# Patient Record
Sex: Female | Born: 1945 | Race: White | Hispanic: No | Marital: Married | Smoking: Never smoker
Health system: Southern US, Community
[De-identification: ages and names within clinical notes are randomized; demographics above are authoritative.]

## PROBLEM LIST (undated history)

## (undated) DIAGNOSIS — E162 Hypoglycemia, unspecified: Secondary | ICD-10-CM

## (undated) DIAGNOSIS — G43909 Migraine, unspecified, not intractable, without status migrainosus: Secondary | ICD-10-CM

## (undated) DIAGNOSIS — M549 Dorsalgia, unspecified: Secondary | ICD-10-CM

## (undated) DIAGNOSIS — E785 Hyperlipidemia, unspecified: Secondary | ICD-10-CM

## (undated) DIAGNOSIS — N3090 Cystitis, unspecified without hematuria: Secondary | ICD-10-CM

## (undated) DIAGNOSIS — M1990 Unspecified osteoarthritis, unspecified site: Secondary | ICD-10-CM

## (undated) DIAGNOSIS — I739 Peripheral vascular disease, unspecified: Secondary | ICD-10-CM

## (undated) DIAGNOSIS — I10 Essential (primary) hypertension: Secondary | ICD-10-CM

## (undated) HISTORY — PX: EYE SURGERY: SHX253

## (undated) HISTORY — PX: TOE SURGERY: SHX1073

## (undated) HISTORY — PX: COLONOSCOPY: SHX174

## (undated) HISTORY — DX: Hyperlipidemia, unspecified: E78.5

## (undated) HISTORY — DX: Essential (primary) hypertension: I10

## (undated) HISTORY — PX: BREAST SURGERY: SHX581

## (undated) HISTORY — DX: Dorsalgia, unspecified: M54.9

## (undated) HISTORY — DX: Migraine, unspecified, not intractable, without status migrainosus: G43.909

## (undated) HISTORY — DX: Unspecified osteoarthritis, unspecified site: M19.90

---

## 2002-08-03 ENCOUNTER — Ambulatory Visit (HOSPITAL_COMMUNITY): Admission: RE | Admit: 2002-08-03 | Discharge: 2002-08-03 | Payer: Self-pay | Admitting: Gastroenterology

## 2002-09-07 ENCOUNTER — Encounter: Payer: Self-pay | Admitting: Emergency Medicine

## 2002-09-07 ENCOUNTER — Emergency Department (HOSPITAL_COMMUNITY): Admission: EM | Admit: 2002-09-07 | Discharge: 2002-09-07 | Payer: Self-pay | Admitting: Emergency Medicine

## 2003-11-02 ENCOUNTER — Ambulatory Visit (HOSPITAL_COMMUNITY): Admission: RE | Admit: 2003-11-02 | Discharge: 2003-11-02 | Payer: Self-pay | Admitting: Gastroenterology

## 2004-08-13 ENCOUNTER — Other Ambulatory Visit: Admission: RE | Admit: 2004-08-13 | Discharge: 2004-08-13 | Payer: Self-pay | Admitting: Internal Medicine

## 2005-08-17 ENCOUNTER — Other Ambulatory Visit: Admission: RE | Admit: 2005-08-17 | Discharge: 2005-08-17 | Payer: Self-pay | Admitting: Internal Medicine

## 2005-09-21 ENCOUNTER — Encounter: Admission: RE | Admit: 2005-09-21 | Discharge: 2005-09-21 | Payer: Self-pay | Admitting: Internal Medicine

## 2006-03-18 ENCOUNTER — Encounter: Admission: RE | Admit: 2006-03-18 | Discharge: 2006-06-16 | Payer: Self-pay | Admitting: Family Medicine

## 2009-11-30 HISTORY — PX: CORNEAL TRANSPLANT: SHX108

## 2009-11-30 HISTORY — PX: CATARACT EXTRACTION: SUR2

## 2011-10-05 ENCOUNTER — Encounter: Payer: Self-pay | Admitting: Family Medicine

## 2011-10-05 ENCOUNTER — Ambulatory Visit (INDEPENDENT_AMBULATORY_CARE_PROVIDER_SITE_OTHER): Payer: BC Managed Care – PPO | Admitting: Family Medicine

## 2011-10-05 VITALS — BP 143/90 | HR 84 | Temp 97.9°F | Ht 68.25 in | Wt 181.2 lb

## 2011-10-05 DIAGNOSIS — N390 Urinary tract infection, site not specified: Secondary | ICD-10-CM

## 2011-10-05 DIAGNOSIS — E785 Hyperlipidemia, unspecified: Secondary | ICD-10-CM | POA: Insufficient documentation

## 2011-10-05 DIAGNOSIS — G43909 Migraine, unspecified, not intractable, without status migrainosus: Secondary | ICD-10-CM | POA: Insufficient documentation

## 2011-10-05 DIAGNOSIS — M1990 Unspecified osteoarthritis, unspecified site: Secondary | ICD-10-CM | POA: Insufficient documentation

## 2011-10-05 DIAGNOSIS — Z0000 Encounter for general adult medical examination without abnormal findings: Secondary | ICD-10-CM

## 2011-10-05 MED ORDER — SUMATRIPTAN SUCCINATE 100 MG PO TABS: 100.0000 mg | ORAL_TABLET | ORAL | Status: DC | PRN

## 2011-10-05 MED ORDER — NITROFURANTOIN MONOHYD MACRO 100 MG PO CAPS: 100.0000 mg | ORAL_CAPSULE | Freq: Every day | ORAL | Status: AC | PRN

## 2011-10-05 NOTE — Progress Notes (Addendum)
  Subjective:    Patient ID: Deborah Hudson, female    DOB: 04/30/1946, 65 y.o.   MRN: [id number]  HPI Here today as a new patient.  She is changing MD's because her doctor does not participate with Medicare.  She has an orthopedist and recently received a steroid injection to her hip.  It has stopped working and she is pursuing hip replacement at this time.  She has not had her health maintenance in approx. 18 months.  She reports an abnormal pap approx. 2-3 years ago that required f/u.  She has no h/o elevated BP.  She reports checking her BS at home and finding it elevated.  She is retired and travels frequently to see her 5 grandchildren.  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  She has headaches, and has seen H/A specialist in the past, had an MRI which was normal.  She takes Imitrex if needed and knows that red wine is a trigger.  She has frequent UTI's after intercourse and takes Macrobid for prophylaxis.  She is an active person and she skis a lot.  She has never smoked.  Had mammogram this year, had flu shot in last 2 wks.  PMH, PSH, Allergies, Meds, SH, FH reviewed.  Review of Systems  Constitutional: Negative for fever, activity change and appetite change.  HENT: Negative for hearing loss, nosebleeds, congestion, rhinorrhea and trouble swallowing.   Eyes: Negative for visual disturbance.  Respiratory: Negative for cough, chest tightness, shortness of breath and wheezing.   Cardiovascular: Negative for chest pain.  Gastrointestinal: Negative for nausea, vomiting, abdominal pain, diarrhea, constipation and blood in stool.  Genitourinary: Negative for dysuria, hematuria, flank pain, vaginal bleeding, difficulty urinating and pelvic pain.  Musculoskeletal: Positive for arthralgias. Negative for gait problem.  Skin: Negative for rash.  Neurological: Positive for headaches. Negative for dizziness, seizures and numbness.  Psychiatric/Behavioral: Negative for behavioral  problems, dysphoric mood and agitation.       Objective:   Physical Exam  Vitals reviewed. Constitutional: She is oriented to person, place, and time. She appears well-developed and well-nourished.  HENT:  Head: Normocephalic and atraumatic.  Neck: Neck supple. No thyromegaly present.  Cardiovascular: Normal rate and regular rhythm.   No murmur heard. Pulmonary/Chest: Effort normal and breath sounds normal. She has no wheezes.  Abdominal: Soft. She exhibits no mass. There is no tenderness.  Neurological: She is alert and oriented to person, place, and time.  Skin: Skin is warm and dry.  Psychiatric: She has a normal mood and affect.          Assessment & Plan:  Initial visit screen. Fasting labs-will bring pt. Back if abnml, o/w mail her results. Refilled meds. Ortho prn.

## 2011-10-05 NOTE — Patient Instructions (Signed)
Preventative Care for Adults, Female A healthy lifestyle and preventative care can promote health and wellness. Preventative health guidelines for women include the following key practices:  A routine yearly physical is a good way to check with your caregiver about your health and preventative screening. It is a chance to share any concerns and updates on your health, and to receive a thorough exam.   Visit your dentist for a routine exam and preventative care every 6 months. Brush your teeth twice a day and floss once a day. Good oral hygiene prevents tooth decay and gum disease.   The frequency of eye exams is based on your age, health, family medical history, use of contact lenses, and other factors. Follow your caregiver's recommendations for frequency of eye exams.   Eat a healthy diet. Foods like vegetables, fruits, whole grains, low-fat dairy products, and lean protein foods contain the nutrients you need without too many calories. Decrease your intake of foods high in solid fats, added sugars, and salt. Eat the right amount of calories for you.Get information about a proper diet from your caregiver, if necessary.   Regular physical exercise is one of the most important things you can do for your health. Most adults should get at least 150 minutes of moderate-intensity exercise (any activity that increases your heart rate and causes you to sweat) each week. In addition, most adults need muscle-strengthening exercises on 2 or more days a week.   Maintain a healthy weight. The body mass index (BMI) is a screening tool to identify possible weight problems. It provides an estimate of body fat based on height and weight. Your caregiver can help determine your BMI, and can help you achieve or maintain a healthy weight.For adults 20 years and older:   A BMI below 18.5 is considered underweight.   A BMI of 18.5 to 24.9 is normal.   A BMI of 25 to 29.9 is considered overweight.   A BMI of 30 and  above is considered obese.   Maintain normal blood lipids and cholesterol levels by exercising and minimizing your intake of saturated fat. Eat a balanced diet with plenty of fruit and vegetables. Blood tests for lipids and cholesterol should begin at age 20 and be repeated every 5 years. If your lipid or cholesterol levels are high, you are over 50, or you are a high risk for heart disease, you may need your cholesterol levels checked more frequently.Ongoing high lipid and cholesterol levels should be treated with medicines if diet and exercise are not effective.   If you smoke, find out from your caregiver how to quit. If you do not use tobacco, do not start.   If you are pregnant, do not drink alcohol. If you are breastfeeding, be very cautious about drinking alcohol. If you are not pregnant and choose to drink alcohol, do not exceed 1 drink per day. One drink is considered to be 12 ounces (355 mL) of beer, 5 ounces (148 mL) of wine, or 1.5 ounces (44 mL) of liquor.   Avoid use of street drugs. Do not share needles with anyone. Ask for help if you need support or instructions about stopping the use of drugs.   High blood pressure causes heart disease and increases the risk of stroke. Your blood pressure should be checked at least every 1 to 2 years. Ongoing high blood pressure should be treated with medicines if weight loss and exercise are not effective.   If you are 55 to 64   years old, ask your caregiver if you should take aspirin to prevent strokes.   Diabetes screening involves taking a blood sample to check your fasting blood sugar level. This should be done once every 3 years, after age 45, if you are within normal weight and without risk factors for diabetes. Testing should be considered at a younger age or be carried out more frequently if you are overweight and have at least 1 risk factor for diabetes.   Breast cancer screening is essential preventative care for women. You should  practice "breast self-awareness." This means understanding the normal appearance and feel of your breasts and may include breast self-examination. Any changes detected, no matter how small, should be reported to a caregiver. Women in their 20s and 30s should have a clinical breast exam (CBE) by a caregiver as part of a regular health exam every 1 to 3 years. After age 40, women should have a CBE every year. Starting at age 40, women should consider having a mammogram (breast X-ray) every year. Women who have a family history of breast cancer should talk to their caregiver about genetic screening. Women at a high risk of breast cancer should talk to their caregiver about having an MRI and a mammogram every year.   The Pap test is a screening test for cervical cancer. A Pap test can show cell changes on the cervix that might become cervical cancer if left untreated. A Pap test is a procedure in which cells are obtained and examined from the lower end of the uterus (cervix).   Women should have a Pap test starting at age 21.   Between ages 21 and 29, Pap tests should be repeated every 2 years.   Beginning at age 30, you should have a Pap test every 3 years as long as the past 3 Pap tests have been normal.   Some women have medical problems that increase the chance of getting cervical cancer. Talk to your caregiver about these problems. It is especially important to talk to your caregiver if a new problem develops soon after your last Pap test. In these cases, your caregiver may recommend more frequent screening and Pap tests.   The above recommendations are the same for women who have or have not gotten the vaccine for human papillomavirus (HPV).   If you had a hysterectomy for a problem that was not cancer or a condition that could lead to cancer, then you no longer need Pap tests. Even if you no longer need a Pap test, a regular exam is a good idea to make sure no other problems are starting.   If you  are between ages 65 and 70, and you have had normal Pap tests going back 10 years, you no longer need Pap tests. Even if you no longer need a Pap test, a regular exam is a good idea to make sure no other problems are starting.   If you have had past treatment for cervical cancer or a condition that could lead to cancer, you need Pap tests and screening for cancer for at least 20 years after your treatment.   If Pap tests have been discontinued, risk factors (such as a new sexual partner) need to be reassessed to determine if screening should be resumed.   The HPV test is an additional test that may be used for cervical cancer screening. The HPV test looks for the virus that can cause the cell changes on the cervix. The cells collected   during the Pap test can be tested for HPV. The HPV test could be used to screen women aged 30 years and older, and should be used in women of any age who have unclear Pap test results. After the age of 30, women should have HPV testing at the same frequency as a Pap test.   Colorectal cancer can be detected and often prevented. Most routine colorectal cancer screening begins at the age of 50 and continues through age 75. However, your caregiver may recommend screening at an earlier age if you have risk factors for colon cancer. On a yearly basis, your caregiver may provide home test kits to check for hidden blood in the stool. Use of a small camera at the end of a tube, to directly examine the colon (sigmoidoscopy or colonoscopy), can detect the earliest forms of colorectal cancer. Talk to your caregiver about this at age 50, when routine screening begins. Direct examination of the colon should be repeated every 5 to 10 years through age 75, unless early forms of pre-cancerous polyps or small growths are found.   Practice safe sex. Use condoms and avoid high-risk sexual practices to reduce the spread of sexually transmitted infections (STIs). STIs include gonorrhea,  chlamydia, syphilis, trichomonas, herpes, HPV, and human immunodeficiency virus (HIV). Herpes, HIV, and HPV are viral illnesses that have no cure. They can result in disability, cancer, and death. Sexually active women aged 25 and younger should be checked for Chlamydia. Older women with new or multiple partners should also be tested for Chlamydia. Testing for other STIs is recommended if you are sexually active and at increased risk.   Osteoporosis is a disease in which the bones lose minerals and strength with aging. This can result in serious bone fractures. The risk of osteoporosis can be identified using a bone density scan. Women ages 65 and over and women at risk for fractures or osteoporosis should discuss screening with their caregivers. Ask your caregiver whether you should take a calcium supplement or vitamin D to reduce the rate of osteoporosis.   Menopause can be associated with physical symptoms and risks. Hormone replacement therapy is available to decrease symptoms and risks. You should talk to your caregiver about whether hormone replacement therapy is right for you.   Use sunscreen with skin protection factor (SPF) of 30 or more. Apply sunscreen liberally and repeatedly throughout the day. You should seek shade when your shadow is shorter than you. Protect yourself by wearing long sleeves, pants, a wide-brimmed hat, and sunglasses year round, whenever you are outdoors.   Once a month, do a whole body skin exam, using a mirror to look at the skin on your back. Notify your caregiver of new moles, moles that have irregular borders, moles that are larger than a pencil eraser, or moles that have changed in shape or color.   Stay current with required immunizations.   Influenza. You need a dose every fall (or winter). The composition of the flu vaccine changes each year, so being vaccinated once is not enough.   Pneumococcal polysaccharide. You need 1 to 2 doses if you smoke cigarettes or  if you have certain chronic medical conditions. You need 1 dose at age 65 (or older) if you have never been vaccinated.   Tetanus, diphtheria, pertussis (Tdap, Td). Get 1 dose of Tdap vaccine if you are younger than age 65 years, are over 65 and have contact with an infant, are a healthcare worker, are pregnant, or simply want   to be protected from whooping cough. After that, you need a Td booster dose every 10 years. Consult your caregiver if you have not had at least 3 tetanus and diphtheria-containing shots sometime in your life or have a deep or dirty wound.   HPV. You need this vaccine if you are a woman age 26 years or younger. The vaccine is given in 3 doses over 6 months.   Measles, mumps, rubella (MMR). You need at least 1 dose of MMR if you were born in 1957 or later. You may also need a 2nd dose.   Meningococcal. If you are age 19 to 21 years and a first-year college student living in a residence hall, or have one of several medical conditions, you need to get vaccinated against meningococcal disease. You may also need additional booster doses.   Zoster (shingles). If you are age 60 years or older, you should get this vaccine.   Varicella (chickenpox). If you have never had chickenpox or you were vaccinated but received only 1 dose, talk to your caregiver to find out if you need this vaccine.   Hepatitis A. You need this vaccine if you have a specific risk factor for hepatitis A virus infection or you simply wish to be protected from this disease. The vaccine is usually given as 2 doses, 6 to 18 months apart.   Hepatitis B. You need this vaccine if you have a specific risk factor for hepatitis B virus infection or you simply wish to be protected from this disease. The vaccine is given in 3 doses, usually over 6 months.  Preventative Services / Frequency Ages 19 to 39  Blood pressure check.** / Every 1 to 2 years.   Lipid and cholesterol check.**/ Every 5 years beginning at age 20.    Clinical breast exam.** / Every 3 years for women in their 20s and 30s.   Pap Test.** / Every 2 years from ages 21 through 29. Every 3 years starting at age 30 years through age 65 or 70 with a history of 3 consecutive normal Pap tests.   HPV Screening.** / Every 3 years from ages 30 through ages 65 to 70 with a history of 3 consecutive normal Pap tests.   Skin self-exam. / Monthly.   Influenza immunization.** / Every year.   Pneumococcal polysaccharide immunization.** / 1 to 2 doses if you smoke cigarettes or if you have certain chronic medical conditions.   Tetanus, diphtheria, pertussis (Tdap,Td) immunization. / A one-time dose of Tdap vaccine. After that, you need a Td booster dose every 10 years.   HPV immunization. / 3 doses over 6 months, if 26 and younger.   Measles, mumps, rubella (MMR) immunization. / You need at least 1 dose of MMR if you were born in 1957 or later. You may also need a 2nd dose.   Meningococcal immunization. / 1 dose if you are age 19 to 21 years and a first-year college student living in a residence hall, or have one of several medical conditions, you need to get vaccinated against meningococcal disease. You may also need additional booster doses.   Varicella immunization. **/ Consult your caregiver.   Hepatitis A immunization. ** / Consult your caregiver. 2 doses, 6 to 18 months apart.   Hepatitis B immunization.** / Consult your caregiver. 3 doses usually over 6 months.  Ages 40 to 64  Blood pressure check.** / Every 1 to 2 years.   Lipid and cholesterol check.**/ Every 5 years beginning   at age 20.   Clinical breast exam.** / Every year after age 40.   Mammogram.** / Every year beginning at age 40 and continuing for as long as you are in good health. Consult with your caregiver.   Pap Test.** / Every 3 years starting at age 30 years through age 65 or 70 with a history of 3 consecutive normal Pap tests.   HPV Screening.** / Every 3 years from  ages 30 through ages 65 to 70 with a history of 3 consecutive normal Pap tests.   Fecal occult blood test (FOBT) of stool. / Every year beginning at age 50 and continuing until age 75. You may not have to do this test if you get colonoscopy every 10 years.   Flexible sigmoidoscopy** or colonoscopy.** / Every 5 years for a flexible sigmoidoscopy or every 10 years for a colonoscopy beginning at age 50 and continuing until age 75.   Skin self-exam. / Monthly.   Influenza immunization.** / Every year.   Pneumococcal polysaccharide immunization.** / 1 to 2 doses if you smoke cigarettes or if you have certain chronic medical conditions.   Tetanus, diphtheria, pertussis (Tdap/Td) immunization.** / A one-time dose of Tdap vaccine. After that, you need a Td booster dose every 10 years.   Measles, mumps, rubella (MMR) immunization. / You need at least 1 dose of MMR if you were born in 1957 or later. You may also need a 2nd dose.   Varicella immunization. **/ Consult your caregiver.   Meningococcal immunization.** / Consult your caregiver.     Hepatitis A immunization. ** / Consult your caregiver. 2 doses, 6 to 18 months apart.   Hepatitis B immunization.** / Consult your caregiver. 3 doses, usually over 6 months.  Ages 65 and over  Blood pressure check.** / Every 1 to 2 years.   Lipid and cholesterol check.**/ Every 5 years beginning at age 20.   Clinical breast exam.** / Every year after age 40.   Mammogram.** / Every year beginning at age 40 and continuing for as long as you are in good health. Consult with your caregiver.   Pap Test,** / Every 3 years starting at age 30 years through age 65 or 70 with a 3 consecutive normal Pap tests. Testing can be stopped between 65 and 70 with 3 consecutive normal Pap tests and no abnormal Pap or HPV tests in the past 10 years.   HPV Screening.** / Every 3 years from ages 30 through ages 65 or 70 with a history of 3 consecutive normal Pap tests.  Testing can be stopped between 65 and 70 with 3 consecutive normal Pap tests and no abnormal Pap or HPV tests in the past 10 years.   Fecal occult blood test (FOBT) of stool. / Every year beginning at age 50 and continuing until age 75. You may not have to do this test if you get colonoscopy every 10 years.   Flexible sigmoidoscopy** or colonoscopy.** / Every 5 years for a flexible sigmoidoscopy or every 10 years for a colonoscopy beginning at age 50 and continuing until age 75.   Osteoporosis screening.** / A one-time screening for women ages 65 and over and women at risk for fractures or osteoporosis.   Skin self-exam. / Monthly.   Influenza immunization.** / Every year.   Pneumococcal polysaccharide immunization.** / 1 dose at age 65 (or older) if you have never been vaccinated.   Tetanus, diphtheria, pertussis (Tdap, Td) immunization. / A one-time dose of Tdap   vaccine if you are over 65 and have contact with an infant, are a Research scientist (physical sciences), or simply want to be protected from whooping cough. After that, you need a Td booster dose every 10 years.   Varicella immunization. **/ Consult your caregiver.   Meningococcal immunization.** / Consult your caregiver.   Hepatitis A immunization. ** / Consult your caregiver. 2 doses, 6 to 18 months apart.   Hepatitis B immunization.** / Check with your caregiver. 3 doses, usually over 6 months.  ** Family history and personal history of risk and conditions may change your caregiver's recommendations. Document Released: 01/12/2002 Document Revised: 07/29/2011 Document Reviewed: 04/13/2011 [facility] Patient Information 2012 [place], [place].Urinary Tract Infection Infections of the urinary tract can start in several places. A bladder infection (cystitis), a kidney infection (pyelonephritis), and a prostate infection (prostatitis) are different types of urinary tract infections (UTIs). They usually get better if treated with medicines (antibiotics)  that kill germs. Take all the medicine until it is gone. You or your child may feel better in a few days, but TAKE ALL MEDICINE or the infection may not respond and may become more difficult to treat. HOME CARE INSTRUCTIONS   Drink enough water and fluids to keep the urine clear or pale yellow. Cranberry juice is especially recommended, in addition to large amounts of water.   Avoid caffeine, tea, and carbonated beverages. They tend to irritate the bladder.   Alcohol may irritate the prostate.   Only take over-the-counter or prescription medicines for pain, discomfort, or fever as directed by your caregiver.  To prevent further infections:  Empty the bladder often. Avoid holding urine for long periods of time.   After a bowel movement, women should cleanse from front to back. Use each tissue only once.   Empty the bladder before and after sexual intercourse.  FINDING OUT THE RESULTS OF YOUR TEST Not all test results are available during your visit. If your or your child's test results are not back during the visit, make an appointment with your caregiver to find out the results. Do not assume everything is normal if you have not heard from your caregiver or the medical facility. It is important for you to follow up on all test results. SEEK MEDICAL CARE IF:   There is back pain.   Your baby is older than 3 months with a rectal temperature of 100.5 F (38.1 C) or higher for more than 1 day.   Your or your child's problems (symptoms) are no better in 3 days. Return sooner if you or your child is getting worse.  SEEK IMMEDIATE MEDICAL CARE IF:   There is severe back pain or lower abdominal pain.   You or your child develops chills.   You have a fever.   Your baby is older than 3 months with a rectal temperature of 102 F (38.9 C) or higher.   Your baby is 35 months old or younger with a rectal temperature of 100.4 F (38 C) or higher.   There is nausea or vomiting.   There is  continued burning or discomfort with urination.  MAKE SURE YOU:   Understand these instructions.   Will watch your condition.   Will get help right away if you are not doing well or get worse.  Document Released: 08/26/2005 Document Revised: 07/29/2011 Document Reviewed: 03/31/2007 [facility] Patient Information 2012 [place], [place].

## 2011-10-09 ENCOUNTER — Other Ambulatory Visit: Payer: BC Managed Care – PPO

## 2011-10-09 DIAGNOSIS — Z0000 Encounter for general adult medical examination without abnormal findings: Secondary | ICD-10-CM

## 2011-10-09 LAB — COMPREHENSIVE METABOLIC PANEL
ALT: 15 U/L (ref 0–35)
AST: 19 U/L (ref 0–37)
Albumin: 4.2 g/dL (ref 3.5–5.2)
Alkaline Phosphatase: 57 U/L (ref 39–117)
BUN: 16 mg/dL (ref 6–23)
CO2: 30 mEq/L (ref 19–32)
Calcium: 9.6 mg/dL (ref 8.4–10.5)
Chloride: 105 mEq/L (ref 96–112)
Creat: 0.98 mg/dL (ref 0.50–1.10)
Glucose, Bld: 87 mg/dL (ref 70–99)
Potassium: 4.2 mEq/L (ref 3.5–5.3)
Sodium: 142 mEq/L (ref 135–145)
Total Bilirubin: 0.6 mg/dL (ref 0.3–1.2)
Total Protein: 6.6 g/dL (ref 6.0–8.3)

## 2011-10-09 LAB — LIPID PANEL
Cholesterol: 187 mg/dL (ref 0–200)
HDL: 51 mg/dL (ref >39)
LDL Cholesterol: 118 mg/dL — ABNORMAL HIGH (ref 0–99)
Total CHOL/HDL Ratio: 3.7 Ratio
Triglycerides: 92 mg/dL (ref <150)
VLDL: 18 mg/dL (ref 0–40)

## 2011-10-09 LAB — CBC
HCT: 44.5 % (ref 36.0–46.0)
Hemoglobin: 14.6 g/dL (ref 12.0–15.0)
MCH: 31.8 pg (ref 26.0–34.0)
MCHC: 32.8 g/dL (ref 30.0–36.0)
MCV: 96.9 fL (ref 78.0–100.0)
Platelets: 282 10*3/uL (ref 150–400)
RBC: 4.59 MIL/uL (ref 3.87–5.11)
RDW: 13.6 % (ref 11.5–15.5)
WBC: 7.2 10*3/uL (ref 4.0–10.5)

## 2011-10-09 LAB — TSH: TSH: 1.577 u[IU]/mL (ref 0.350–4.500)

## 2011-10-09 NOTE — Progress Notes (Signed)
Cmp,cbc,tsh and flp done today [facility] [place]

## 2011-10-12 ENCOUNTER — Encounter: Payer: Self-pay | Admitting: Family Medicine

## 2011-11-03 ENCOUNTER — Other Ambulatory Visit: Payer: Self-pay | Admitting: Orthopedic Surgery

## 2012-02-01 NOTE — H&P (Signed)
Deborah Hudson DOB: 12-29-1945  Chief Complaint: right hip pain  History of Present Illness The patient is a 66 year old female who comes in today for a preoperative History and Physical. The patient is scheduled for a right total hip arthroplasty to be performed by Dr. Gus Hudson. Aluisio, MD at [facility] on February 17, 2012. Ms. Deborah Hudson presented to Dr. Lequita Hudson in August 2012 for a second opinion regarding her right hip pain. She had been seen by Dr. Valentina Hudson at [facility] who stated that she needed a hip replacement but needed to lose weight first. Symptoms reported today include pain, pain after sitting, pain at night and stiffness. The patient feels that she is doing poorly and report their pain level to be moderate.She had an intra-articular injection 10 weeks ago. The patient has reported improvement of their symptoms with the injection for only 6 weeks. She is looking for a permanent solution to her pain. Right total hip arthroplasty best chance for increased function and decreased pain. PCP: Dr. Herb Hudson   Past Medical History Osteoarthritis, Hip (715.35) Tear, medial meniscus, knee, current (836.0). 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 06/15/2002 Migraine Headache Shingles. 1978 Cataract Varicose veins Gallbladder Problems. gall stones 2010 Urinary Incontinence Urinary Tract Infection. 2-3 year Hypoglycemia Menopause   Family History Cancer. mother and grandfather mothers side Cerebrovascular Accident. father and grandmother mothers side Congestive Heart Failure. grandfather fathers side Hypertension. mother Osteoarthritis. mother and grandmother fathers side Father. deceased age 79, stroke Mother. deceased age 69 lung cancer Sister 1. breast cancer age 53   Social History Alcohol use. current drinker; drinks wine; only occasionally per week Children. 2 Current work status. retired Financial planner (Currently).  no Drug/Alcohol Rehab (Previously). no Exercise. Exercises weekly; does individual sport and team sport Illicit drug use. no Living situation. live with spouse Marital status. married Number of flights of stairs before winded. 4-5 Pain Contract. no Tobacco / smoke exposure. no Tobacco use. 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 living will, healthcare POA   Medication History SUMAtriptan Succinate (100MG  Tablet, Oral as needed) Active. Nitrofurantoin Macrocrystal (100MG  Capsule, Oral as needed) Active.   Past Surgical History Breast Biopsy. bilateral 1970 Bladder surgery 2009 Great tor scraping. for arthritis 2004   Review of Systems General:Not Present- Chills, Fever, Night Sweats, Fatigue, Weight Gain, Weight Loss and Memory Loss. Skin:Not Present- Hives, Itching, Rash, Eczema and Lesions. HEENT:Not Present- Tinnitus, Headache, Double Vision, Visual Loss, Hearing Loss and Dentures. Respiratory:Not Present- Shortness of breath with exertion, Shortness of breath at rest, Allergies, Coughing up blood and Chronic Cough. Cardiovascular:Not Present- Chest Pain, Racing/skipping heartbeats, Difficulty Breathing Lying Down, Murmur, Swelling and Palpitations. Gastrointestinal:Not Present- Bloody Stool, Heartburn, Abdominal Pain, Vomiting, Nausea, Constipation, Diarrhea, Difficulty Swallowing, Jaundice and Loss of appetitie. Female Genitourinary:Present- Urine Leakage. Not Present- Blood in Urine, Urinary frequency, Weak urinary stream, Discharge, Flank Pain, Incontinence, Painful Urination, Urgency, Urinary Retention and Urinating at Night. Musculoskeletal:Present- Joint Swelling, Joint Pain, Back Pain and Morning Stiffness. Not Present- Muscle Weakness, Muscle Pain and Spasms. Neurological:Not Present- Tremor, Dizziness, Blackout spells, Paralysis, Difficulty with balance and Weakness. Psychiatric:Not Present-  Insomnia.   Vitals 02/01/2012 3:40 PM Weight: 180 lb Height: 68.5 in Body Surface Area: 1.99 m Body Mass Index: 26.97 kg/m Pulse: 72 (Regular) Resp.: 16 (Unlabored) BP: 124/78 (Sitting, Left Arm, Standard)    Physical Exam General Mental Status - Alert, cooperative and good historian. General Appearance- pleasant. Not in acute distress. Orientation- Oriented X3. Build & Nutrition- Well  nourished and Well developed. Head and Neck Head- normocephalic, atraumatic . Neck Global Assessment- supple. no bruit auscultated on the right and no bruit auscultated on the left. Eye Pupil- Bilateral- PERRLA. Motion- Bilateral- EOMI. Chest and Lung Exam Auscultation: Breath sounds:- clear at anterior chest wall and - clear at posterior chest wall. Adventitious sounds:- No Adventitious sounds. Cardiovascular Auscultation:Rhythm- Regular rate and rhythm. Heart Sounds- S1 WNL and S2 WNL. Murmurs & Other Heart Sounds:Auscultation of the heart reveals - No Murmurs. Abdomen Palpation/Percussion:Tenderness- Abdomen is non-tender to palpation. Rigidity (guarding)- Abdomen is soft. Auscultation:Auscultation of the abdomen reveals - Bowel sounds normal. Female Genitourinary Not done, not pertinent to present illness Peripheral Vascular Upper Extremity: Palpation:- Pulses bilaterally normal. Lower Extremity: Palpation:- Pulses bilaterally normal. Neurologic Examination of related systems reveals - normal muscle strength and tone in all extremities. Neurologic evaluation reveals - normal sensation and upper and lower extremity deep tendon reflexes intact bilaterally . Musculoskeletal Evaluation of her right hip flexion 100. No internal rotation. About 20 external rotation, 20 abduction. Left hip flexion 110, rotation in 20, out 30, abduction 30 with discomfort. She does not have trochanteric tenderness. Knee exams are normal.   RADIOGRAPHS: AP  pelvis, lateral of the hip show advanced arthritis, bone on bone of the right worse than left hip. She has large osteophyte formation. The osteophytes are mainly on the right.    Assessment & Plan Osteoarthritis, Hip (715.35) Right total hip arthroplasty    Dimitri Ped, PA-C

## 2012-02-09 ENCOUNTER — Encounter (HOSPITAL_COMMUNITY): Payer: Self-pay | Admitting: Pharmacy Technician

## 2012-02-12 ENCOUNTER — Encounter (HOSPITAL_COMMUNITY)
Admission: RE | Admit: 2012-02-12 | Discharge: 2012-02-12 | Disposition: A | Discharge status: Home / Self Care | Payer: BC Managed Care – PPO | Source: Ambulatory Visit | Attending: Orthopedic Surgery | Admitting: Orthopedic Surgery

## 2012-02-12 ENCOUNTER — Ambulatory Visit (HOSPITAL_COMMUNITY)
Admission: RE | Admit: 2012-02-12 | Discharge: 2012-02-12 | Disposition: A | Discharge status: Home / Self Care | Payer: BC Managed Care – PPO | Source: Ambulatory Visit | Attending: Orthopedic Surgery | Admitting: Orthopedic Surgery

## 2012-02-12 ENCOUNTER — Other Ambulatory Visit (HOSPITAL_COMMUNITY): Payer: Self-pay | Admitting: Orthopedic Surgery

## 2012-02-12 ENCOUNTER — Encounter (HOSPITAL_COMMUNITY): Payer: Self-pay

## 2012-02-12 DIAGNOSIS — M169 Osteoarthritis of hip, unspecified: Secondary | ICD-10-CM | POA: Insufficient documentation

## 2012-02-12 DIAGNOSIS — M1610 Unilateral primary osteoarthritis, unspecified hip: Secondary | ICD-10-CM | POA: Insufficient documentation

## 2012-02-12 DIAGNOSIS — Z01818 Encounter for other preprocedural examination: Secondary | ICD-10-CM | POA: Insufficient documentation

## 2012-02-12 DIAGNOSIS — Z01812 Encounter for preprocedural laboratory examination: Secondary | ICD-10-CM | POA: Insufficient documentation

## 2012-02-12 HISTORY — DX: Peripheral vascular disease, unspecified: I73.9

## 2012-02-12 HISTORY — DX: Hypoglycemia, unspecified: E16.2

## 2012-02-12 HISTORY — DX: Cystitis, unspecified without hematuria: N30.90

## 2012-02-12 LAB — URINALYSIS, ROUTINE W REFLEX MICROSCOPIC
Bilirubin Urine: NEGATIVE
Glucose, UA: NEGATIVE mg/dL
Hgb urine dipstick: NEGATIVE
Ketones, ur: NEGATIVE mg/dL
Leukocytes, UA: NEGATIVE
Nitrite: NEGATIVE
Protein, ur: NEGATIVE mg/dL
Specific Gravity, Urine: 1.014 (ref 1.005–1.030)
Urobilinogen, UA: 1 mg/dL (ref 0.0–1.0)
pH: 6.5 (ref 5.0–8.0)

## 2012-02-12 LAB — SURGICAL PCR SCREEN
MRSA, PCR: NEGATIVE
Staphylococcus aureus: NEGATIVE

## 2012-02-12 LAB — PROTIME-INR
INR: 1.01 (ref 0.00–1.49)
Prothrombin Time: 13.5 seconds (ref 11.6–15.2)

## 2012-02-12 LAB — APTT: aPTT: 29 seconds (ref 24–37)

## 2012-02-12 MED ORDER — CHLORHEXIDINE GLUCONATE 4 % EX LIQD: 60.0000 mL | Freq: Once | CUTANEOUS | Status: DC

## 2012-02-12 NOTE — Patient Instructions (Signed)
20 Deborah Hudson  02/12/2012   Your procedure is scheduled on:  02/17/12  Wednesday  Surgery [id number]  Report to [facility] at  0815     AM.  Call this number if you have problems the morning of surgery: [phone]     Or PST   [phone]  [facility]   Remember:   Do not eat food:After Midnight.    Tuesday night  May have clear liquids  Until  midnight: Tuesday night  Clear liquids include soda, tea, black coffee, apple or grape juice, broth.  Take these medicines the morning of surgery with A SIP OF WATER: none   Do not wear jewelry, make-up or nail polish.  Do not wear lotions, powders, or perfumes. You may wear deodorant.  Do not shave 48 hours prior to surgery.  Do not bring valuables to the hospital.  Contacts, dentures or bridgework may not be worn into surgery.  Leave suitcase in the car. After surgery it may be brought to your room.  For patients admitted to the hospital, checkout time is 11:00 AM the day of discharge.   Patients discharged the day of surgery will not be allowed to drive home.  Name and phone number of your driver: Art                                                                     Special Instructions: CHG Shower Use Special Wash: 1/2 bottle night before surgery and 1/2 bottle morning of surgery. REGULAR SOAP FACE AND PRIVATES              LADIES- NO SHAVING 48 HOURS BEFORE USING BETASEPT SOAP.                Please read over the following fact sheets that you were given: MRSA Information

## 2012-02-12 NOTE — Pre-Procedure Instructions (Signed)
CLEARANCE NOTE  J PRESSON PA/Dr Spears with OV 02/03/12 on chart.   CBC,CMET,EKG and chest x ray on chart 02/03/12

## 2012-02-17 ENCOUNTER — Inpatient Hospital Stay (HOSPITAL_COMMUNITY): Payer: BC Managed Care – PPO | Admitting: Anesthesiology

## 2012-02-17 ENCOUNTER — Encounter (HOSPITAL_COMMUNITY): Payer: Self-pay | Admitting: Anesthesiology

## 2012-02-17 ENCOUNTER — Inpatient Hospital Stay (HOSPITAL_COMMUNITY): Payer: BC Managed Care – PPO

## 2012-02-17 ENCOUNTER — Inpatient Hospital Stay (HOSPITAL_COMMUNITY)
Admission: RE | Admit: 2012-02-17 | Discharge: 2012-02-21 | DRG: 818 | Disposition: A | Discharge status: Home Health | Payer: BC Managed Care – PPO | Source: Ambulatory Visit | Attending: Orthopedic Surgery | Admitting: Orthopedic Surgery

## 2012-02-17 ENCOUNTER — Encounter (HOSPITAL_COMMUNITY): Payer: Self-pay | Admitting: *Deleted

## 2012-02-17 ENCOUNTER — Encounter (HOSPITAL_COMMUNITY)
Admission: RE | Disposition: A | Discharge status: Home Health | Payer: Self-pay | Source: Ambulatory Visit | Attending: Orthopedic Surgery

## 2012-02-17 ENCOUNTER — Encounter (HOSPITAL_COMMUNITY): Payer: Self-pay | Admitting: Orthopedic Surgery

## 2012-02-17 DIAGNOSIS — E871 Hypo-osmolality and hyponatremia: Secondary | ICD-10-CM | POA: Diagnosis not present

## 2012-02-17 DIAGNOSIS — E785 Hyperlipidemia, unspecified: Secondary | ICD-10-CM | POA: Diagnosis present

## 2012-02-17 DIAGNOSIS — Z96649 Presence of unspecified artificial hip joint: Secondary | ICD-10-CM

## 2012-02-17 DIAGNOSIS — I739 Peripheral vascular disease, unspecified: Secondary | ICD-10-CM | POA: Diagnosis present

## 2012-02-17 DIAGNOSIS — M169 Osteoarthritis of hip, unspecified: Secondary | ICD-10-CM | POA: Diagnosis present

## 2012-02-17 DIAGNOSIS — M1610 Unilateral primary osteoarthritis, unspecified hip: Principal | ICD-10-CM | POA: Diagnosis present

## 2012-02-17 HISTORY — PX: TOTAL HIP ARTHROPLASTY: SHX124

## 2012-02-17 LAB — TYPE AND SCREEN
ABO/RH(D): B POS
Antibody Screen: NEGATIVE

## 2012-02-17 LAB — ABO/RH: ABO/RH(D): B POS

## 2012-02-17 SURGERY — ARTHROPLASTY, HIP, TOTAL,POSTERIOR APPROACH
Anesthesia: Spinal | Site: Hip | Laterality: Right | Wound class: Clean

## 2012-02-17 MED ORDER — RIVAROXABAN 10 MG PO TABS
10.0000 mg | ORAL_TABLET | Freq: Every day | ORAL | Status: DC
  Administered 2012-02-18 – 2012-02-21 (×4): 10 mg via ORAL
  Filled 2012-02-17 (×4): qty 1

## 2012-02-17 MED ORDER — SODIUM CHLORIDE 0.9 % IV SOLN
INTRAVENOUS | Status: DC
  Administered 2012-02-17: 14:00:00 via INTRAVENOUS

## 2012-02-17 MED ORDER — FLEET ENEMA 7-19 GM/118ML RE ENEM: 1.0000 | ENEMA | Freq: Once | RECTAL | Status: AC | PRN

## 2012-02-17 MED ORDER — PROPOFOL 10 MG/ML IV EMUL
INTRAVENOUS | Status: DC | PRN
  Administered 2012-02-17: 50 ug/kg/min via INTRAVENOUS

## 2012-02-17 MED ORDER — TRAMADOL HCL 50 MG PO TABS: 50.0000 mg | ORAL_TABLET | Freq: Four times a day (QID) | ORAL | Status: DC | PRN

## 2012-02-17 MED ORDER — METHOCARBAMOL 500 MG PO TABS
500.0000 mg | ORAL_TABLET | Freq: Four times a day (QID) | ORAL | Status: DC | PRN
  Administered 2012-02-18 – 2012-02-21 (×10): 500 mg via ORAL
  Filled 2012-02-17 (×11): qty 1

## 2012-02-17 MED ORDER — ACETAMINOPHEN 10 MG/ML IV SOLN: 1000.0000 mg | Freq: Once | INTRAVENOUS | Status: DC

## 2012-02-17 MED ORDER — METHOCARBAMOL 100 MG/ML IJ SOLN
500.0000 mg | Freq: Four times a day (QID) | INTRAMUSCULAR | Status: DC | PRN
  Administered 2012-02-17 (×2): 500 mg via INTRAVENOUS
  Filled 2012-02-17 (×2): qty 5

## 2012-02-17 MED ORDER — HYDROMORPHONE HCL PF 1 MG/ML IJ SOLN
0.2500 mg | INTRAMUSCULAR | Status: DC | PRN
  Administered 2012-02-17 (×2): 0.5 mg via INTRAVENOUS

## 2012-02-17 MED ORDER — PROPOFOL 10 MG/ML IV BOLUS
INTRAVENOUS | Status: DC | PRN
  Administered 2012-02-17: 30 mg via INTRAVENOUS

## 2012-02-17 MED ORDER — ACETAMINOPHEN 10 MG/ML IV SOLN
INTRAVENOUS | Status: DC | PRN
  Administered 2012-02-17: 1000 mg via INTRAVENOUS

## 2012-02-17 MED ORDER — METOCLOPRAMIDE HCL 10 MG PO TABS: 5.0000 mg | ORAL_TABLET | Freq: Three times a day (TID) | ORAL | Status: DC | PRN

## 2012-02-17 MED ORDER — SUMATRIPTAN SUCCINATE 100 MG PO TABS
100.0000 mg | ORAL_TABLET | ORAL | Status: DC | PRN
  Filled 2012-02-17: qty 1

## 2012-02-17 MED ORDER — OXYCODONE HCL 5 MG PO TABS
5.0000 mg | ORAL_TABLET | ORAL | Status: DC | PRN
  Administered 2012-02-17 – 2012-02-19 (×11): 10 mg via ORAL
  Administered 2012-02-19 (×3): 5 mg via ORAL
  Administered 2012-02-19 – 2012-02-20 (×3): 10 mg via ORAL
  Administered 2012-02-20 – 2012-02-21 (×7): 5 mg via ORAL
  Administered 2012-02-21: 10 mg via ORAL
  Filled 2012-02-17 (×3): qty 2
  Filled 2012-02-17 (×5): qty 1
  Filled 2012-02-17 (×3): qty 2
  Filled 2012-02-17 (×3): qty 1
  Filled 2012-02-17 (×9): qty 2
  Filled 2012-02-17: qty 1
  Filled 2012-02-17: qty 2

## 2012-02-17 MED ORDER — BUPIVACAINE LIPOSOME 1.3 % IJ SUSP
INTRAMUSCULAR | Status: DC | PRN
  Administered 2012-02-17: 20 mL

## 2012-02-17 MED ORDER — METOCLOPRAMIDE HCL 5 MG/ML IJ SOLN
5.0000 mg | Freq: Three times a day (TID) | INTRAMUSCULAR | Status: DC | PRN
  Administered 2012-02-18: 10 mg via INTRAVENOUS
  Filled 2012-02-17: qty 2

## 2012-02-17 MED ORDER — ONDANSETRON HCL 4 MG PO TABS: 4.0000 mg | ORAL_TABLET | Freq: Four times a day (QID) | ORAL | Status: DC | PRN

## 2012-02-17 MED ORDER — ONDANSETRON HCL 4 MG/2ML IJ SOLN
4.0000 mg | Freq: Four times a day (QID) | INTRAMUSCULAR | Status: DC | PRN
  Administered 2012-02-17: 4 mg via INTRAVENOUS
  Filled 2012-02-17: qty 2

## 2012-02-17 MED ORDER — DEXAMETHASONE SODIUM PHOSPHATE 10 MG/ML IJ SOLN
INTRAMUSCULAR | Status: DC | PRN
  Administered 2012-02-17: 10 mg via INTRAVENOUS

## 2012-02-17 MED ORDER — ACETAMINOPHEN 10 MG/ML IV SOLN
1000.0000 mg | Freq: Four times a day (QID) | INTRAVENOUS | Status: AC
Start: 2012-02-17 — End: 2012-02-18
  Administered 2012-02-17 – 2012-02-18 (×4): 1000 mg via INTRAVENOUS
  Filled 2012-02-17 (×4): qty 100

## 2012-02-17 MED ORDER — HYDROMORPHONE HCL PF 1 MG/ML IJ SOLN
INTRAMUSCULAR | Status: AC
  Filled 2012-02-17: qty 1

## 2012-02-17 MED ORDER — BISACODYL 10 MG RE SUPP: 10.0000 mg | Freq: Every day | RECTAL | Status: DC | PRN

## 2012-02-17 MED ORDER — ACETAMINOPHEN 325 MG PO TABS: 650.0000 mg | ORAL_TABLET | Freq: Four times a day (QID) | ORAL | Status: DC | PRN

## 2012-02-17 MED ORDER — FENTANYL CITRATE 0.05 MG/ML IJ SOLN
INTRAMUSCULAR | Status: DC | PRN
  Administered 2012-02-17: 50 ug via INTRAVENOUS

## 2012-02-17 MED ORDER — BUPIVACAINE HCL 0.75 % IJ SOLN
INTRAMUSCULAR | Status: DC | PRN
  Administered 2012-02-17: 15 mg

## 2012-02-17 MED ORDER — PHENYLEPHRINE HCL 10 MG/ML IJ SOLN
INTRAMUSCULAR | Status: DC | PRN
  Administered 2012-02-17 (×6): 100 ug via INTRAVENOUS

## 2012-02-17 MED ORDER — LACTATED RINGERS IV SOLN: INTRAVENOUS | Status: DC

## 2012-02-17 MED ORDER — DEXAMETHASONE SODIUM PHOSPHATE 10 MG/ML IJ SOLN
10.0000 mg | Freq: Once | INTRAMUSCULAR | Status: DC
  Filled 2012-02-17: qty 1

## 2012-02-17 MED ORDER — DIPHENHYDRAMINE HCL 12.5 MG/5ML PO ELIX
12.5000 mg | ORAL_SOLUTION | ORAL | Status: DC | PRN
  Administered 2012-02-17: 25 mg via ORAL
  Filled 2012-02-17: qty 10

## 2012-02-17 MED ORDER — SODIUM CHLORIDE 0.9 % IJ SOLN
INTRAMUSCULAR | Status: DC | PRN
  Administered 2012-02-17: 50 mL

## 2012-02-17 MED ORDER — TEMAZEPAM 15 MG PO CAPS
15.0000 mg | ORAL_CAPSULE | Freq: Every evening | ORAL | Status: DC | PRN
  Administered 2012-02-18: 30 mg via ORAL
  Filled 2012-02-17: qty 2

## 2012-02-17 MED ORDER — PHENOL 1.4 % MT LIQD: 1.0000 | OROMUCOSAL | Status: DC | PRN

## 2012-02-17 MED ORDER — PHENYLEPHRINE HCL 10 MG/ML IJ SOLN
10.0000 mg | INTRAVENOUS | Status: DC | PRN
  Administered 2012-02-17: 50 ug/min via INTRAVENOUS

## 2012-02-17 MED ORDER — SODIUM CHLORIDE 0.9 % IV SOLN: INTRAVENOUS | Status: DC | PRN

## 2012-02-17 MED ORDER — DOCUSATE SODIUM 100 MG PO CAPS
100.0000 mg | ORAL_CAPSULE | Freq: Two times a day (BID) | ORAL | Status: DC
  Administered 2012-02-17 – 2012-02-21 (×8): 100 mg via ORAL
  Filled 2012-02-17 (×8): qty 1

## 2012-02-17 MED ORDER — 0.9 % SODIUM CHLORIDE (POUR BTL) OPTIME
TOPICAL | Status: DC | PRN
  Administered 2012-02-17: 1000 mL

## 2012-02-17 MED ORDER — MORPHINE SULFATE 2 MG/ML IJ SOLN
1.0000 mg | INTRAMUSCULAR | Status: DC | PRN
  Administered 2012-02-18 (×3): 2 mg via INTRAVENOUS
  Filled 2012-02-17 (×4): qty 1

## 2012-02-17 MED ORDER — ACETAMINOPHEN 650 MG RE SUPP: 650.0000 mg | Freq: Four times a day (QID) | RECTAL | Status: DC | PRN

## 2012-02-17 MED ORDER — MENTHOL 3 MG MT LOZG: 1.0000 | LOZENGE | OROMUCOSAL | Status: DC | PRN

## 2012-02-17 MED ORDER — BUPIVACAINE LIPOSOME 1.3 % IJ SUSP
20.0000 mL | Freq: Once | INTRAMUSCULAR | Status: DC
  Filled 2012-02-17: qty 20

## 2012-02-17 MED ORDER — POLYETHYLENE GLYCOL 3350 17 G PO PACK: 17.0000 g | PACK | Freq: Every day | ORAL | Status: DC | PRN

## 2012-02-17 MED ORDER — SODIUM CHLORIDE 0.9 % IV SOLN
INTRAVENOUS | Status: DC
  Administered 2012-02-17: 100 mL/h via INTRAVENOUS
  Administered 2012-02-18 – 2012-02-19 (×2): via INTRAVENOUS

## 2012-02-17 MED ORDER — MIDAZOLAM HCL 5 MG/5ML IJ SOLN
INTRAMUSCULAR | Status: DC | PRN
  Administered 2012-02-17: 2 mg via INTRAVENOUS

## 2012-02-17 MED ORDER — LACTATED RINGERS IV SOLN
INTRAVENOUS | Status: DC
  Administered 2012-02-17: 12:00:00 via INTRAVENOUS
  Administered 2012-02-17: 1000 mL via INTRAVENOUS

## 2012-02-17 MED ORDER — CEFAZOLIN SODIUM-DEXTROSE 2-3 GM-% IV SOLR
2.0000 g | Freq: Once | INTRAVENOUS | Status: AC
  Administered 2012-02-17: 2 g via INTRAVENOUS

## 2012-02-17 MED ORDER — CEFAZOLIN SODIUM 1-5 GM-% IV SOLN
1.0000 g | Freq: Four times a day (QID) | INTRAVENOUS | Status: AC
  Administered 2012-02-17 – 2012-02-18 (×3): 1 g via INTRAVENOUS
  Filled 2012-02-17 (×3): qty 50

## 2012-02-17 SURGICAL SUPPLY — 51 items
BAG SPEC THK2 15X12 ZIP CLS (MISCELLANEOUS) ×1
BAG ZIPLOCK 12X15 (MISCELLANEOUS) ×2 IMPLANT
BIT DRILL 2.8X128 (BIT) ×2 IMPLANT
BLADE EXTENDED COATED 6.5IN (ELECTRODE) ×2 IMPLANT
BLADE SAW SAG 73X25 THK (BLADE) ×1
BLADE SAW SGTL 73X25 THK (BLADE) ×1 IMPLANT
CLOTH BEACON ORANGE TIMEOUT ST (SAFETY) ×2 IMPLANT
CLSR STERI-STRIP ANTIMIC 1/2X4 (GAUZE/BANDAGES/DRESSINGS) ×1 IMPLANT
DECANTER SPIKE VIAL GLASS SM (MISCELLANEOUS) ×2 IMPLANT
DRAPE INCISE IOBAN 66X45 STRL (DRAPES) ×2 IMPLANT
DRAPE ORTHO SPLIT 77X108 STRL (DRAPES) ×4
DRAPE POUCH INSTRU U-SHP 10X18 (DRAPES) ×2 IMPLANT
DRAPE SURG ORHT 6 SPLT 77X108 (DRAPES) ×2 IMPLANT
DRAPE U-SHAPE 47X51 STRL (DRAPES) ×2 IMPLANT
DRSG ADAPTIC 3X8 NADH LF (GAUZE/BANDAGES/DRESSINGS) ×2 IMPLANT
DRSG MEPILEX BORDER 4X4 (GAUZE/BANDAGES/DRESSINGS) ×2 IMPLANT
DRSG MEPILEX BORDER 4X8 (GAUZE/BANDAGES/DRESSINGS) ×2 IMPLANT
DURAPREP 26ML APPLICATOR (WOUND CARE) ×2 IMPLANT
ELECT REM PT RETURN 9FT ADLT (ELECTROSURGICAL) ×2
ELECTRODE REM PT RTRN 9FT ADLT (ELECTROSURGICAL) ×1 IMPLANT
EVACUATOR 1/8 PVC DRAIN (DRAIN) ×2 IMPLANT
FACESHIELD LNG OPTICON STERILE (SAFETY) ×8 IMPLANT
GLOVE BIO SURGEON STRL SZ7.5 (GLOVE) ×2 IMPLANT
GLOVE BIO SURGEON STRL SZ8 (GLOVE) ×2 IMPLANT
GLOVE BIOGEL PI IND STRL 8 (GLOVE) ×2 IMPLANT
GLOVE BIOGEL PI INDICATOR 8 (GLOVE) ×2
GOWN STRL NON-REIN LRG LVL3 (GOWN DISPOSABLE) ×2 IMPLANT
GOWN STRL REIN XL XLG (GOWN DISPOSABLE) ×2 IMPLANT
IMMOBILIZER KNEE 20 (SOFTGOODS) ×2
IMMOBILIZER KNEE 20 THIGH 36 (SOFTGOODS) IMPLANT
KIT BASIN OR (CUSTOM PROCEDURE TRAY) ×2 IMPLANT
MANIFOLD NEPTUNE II (INSTRUMENTS) ×2 IMPLANT
NDL SAFETY ECLIPSE 18X1.5 (NEEDLE) ×1 IMPLANT
NEEDLE HYPO 18GX1.5 SHARP (NEEDLE) ×2
NS IRRIG 1000ML POUR BTL (IV SOLUTION) ×2 IMPLANT
PACK TOTAL JOINT (CUSTOM PROCEDURE TRAY) ×2 IMPLANT
PASSER SUT SWANSON 36MM LOOP (INSTRUMENTS) ×2 IMPLANT
POSITIONER SURGICAL ARM (MISCELLANEOUS) ×2 IMPLANT
SPONGE GAUZE 4X4 12PLY (GAUZE/BANDAGES/DRESSINGS) ×2 IMPLANT
STRIP CLOSURE SKIN 1/2X4 (GAUZE/BANDAGES/DRESSINGS) ×4 IMPLANT
SUT ETHIBOND NAB CT1 #1 30IN (SUTURE) ×4 IMPLANT
SUT MNCRL AB 4-0 PS2 18 (SUTURE) ×2 IMPLANT
SUT VIC AB 1 CT1 27 (SUTURE) ×6
SUT VIC AB 1 CT1 27XBRD ANTBC (SUTURE) ×3 IMPLANT
SUT VIC AB 2-0 CT1 27 (SUTURE) ×6
SUT VIC AB 2-0 CT1 TAPERPNT 27 (SUTURE) ×3 IMPLANT
SYR 50ML LL SCALE MARK (SYRINGE) ×2 IMPLANT
TOWEL OR 17X26 10 PK STRL BLUE (TOWEL DISPOSABLE) ×4 IMPLANT
TOWEL OR NON WOVEN STRL DISP B (DISPOSABLE) ×2 IMPLANT
TRAY FOLEY CATH 14FRSI W/METER (CATHETERS) ×2 IMPLANT
WATER STERILE IRR 1500ML POUR (IV SOLUTION) ×2 IMPLANT

## 2012-02-17 NOTE — Anesthesia Preprocedure Evaluation (Signed)
Anesthesia Evaluation  Patient identified by MRN, date of birth, ID band Patient awake    Reviewed: Allergy & Precautions, H&P , NPO status , Patient's Chart, lab work & pertinent test results  Airway Mallampati: II TM Distance: >3 FB Neck ROM: full    Dental  (+) Dental Advisory Given,    Pulmonary neg pulmonary ROS,  breath sounds clear to auscultation  Pulmonary exam normal       Cardiovascular Exercise Tolerance: Good negative cardio ROS  Rhythm:regular Rate:Normal     Neuro/Psych negative neurological ROS  negative psych ROS   GI/Hepatic negative GI ROS, Neg liver ROS,   Endo/Other  negative endocrine ROS  Renal/GU negative Renal ROS  negative genitourinary   Musculoskeletal   Abdominal   Peds  Hematology negative hematology ROS (+)   Anesthesia Other Findings   Reproductive/Obstetrics negative OB ROS                           Anesthesia Physical Anesthesia Plan  ASA: I  Anesthesia Plan: Spinal   Post-op Pain Management:    Induction:   Airway Management Planned: Nasal Cannula  Additional Equipment:   Intra-op Plan:   Post-operative Plan:   Informed Consent: I have reviewed the patients History and Physical, chart, labs and discussed the procedure including the risks, benefits and alternatives for the proposed anesthesia with the patient or authorized representative who has indicated his/her understanding and acceptance.   Dental Advisory Given  Plan Discussed with: CRNA  Anesthesia Plan Comments:         Anesthesia Quick Evaluation

## 2012-02-17 NOTE — Op Note (Signed)
Pre-operative diagnosis- Osteoarthritis Right hip  Post-operative diagnosis- Osteoarthritis  Right hip  Procedure-  RightTotal Hip Arthroplasty  Surgeon- Gus Rankin. Maxamus Colao, MD  Assistant- Avel Peace, PA-C   Anesthesia  Spinal  EBL- 200   Drain Hemovac   Complication- None  Condition-PACU - hemodynamically stable.   Brief Clinical Note-  Deborah Hudson is a 66 y.o. female with end stage arthritis of her right hip with progressively worsening pain and dysfunction. Pain occurs with activity and rest including pain at night. She has tried analgesics, protected weight bearing and rest without benefit. Pain is too severe to attempt physical therapy. Radiographs demonstrate bone on bone arthritis with subchondral cyst formation. She presents now for right THA.  Procedure in detail-   The patient is brought into the operating room and placed on the operating table. After successful administration of Spinal  anesthesia, the patient is placed in the  Left lateral decubitus position with the  Right side up and held in place with the hip positioner. The lower extremity is isolated from the perineum with plastic drapes and time-out is performed by the surgical team. The lower extremity is then prepped and draped in the usual sterile fashion. A short posterolateral incision is made with a ten blade through the subcutaneous tissue to the level of the fascia lata which is incised in line with the skin incision. The sciatic nerve is palpated and protected and the short external rotators and capsule are isolated from the femur. The hip is then dislocated and the center of the femoral head is marked. A trial prosthesis is placed such that the trial head corresponds to the center of the patients' native femoral head. The resection level is marked on the femoral neck and the resection is made with an oscillating saw. The femoral head is removed and femoral retractors placed to gain access to the femoral  canal.      The canal finder is passed into the femoral canal and the canal is thoroughly irrigated with sterile saline to remove the fatty contents. Axial reaming is performed to 11.5  mm, proximal reaming to 16D  and the sleeve machined to a large. A 16D large trial sleeve is placed into the proximal femur.      The femur is then retracted anteriorly to gain acetabular exposure. Acetabular retractors are placed and the labrum and osteophytes are removed, Acetabular reaming is performed to 51  mm and a 52  mm Pinnacle acetabular shell is placed in anatomic position with excellent purchase. Additional dome screws were not needed. An apex hole eliminator is placed and the permanent 32 mm neutral + 4 Marathon liner is placed into the acetabular shell.      The trial femur is then placed into the femoral canal. The size is 16 x 11  stem with a 36 + 6  neck and a 32 + 3 head with the neck version 10 degrees beyond  the patients' native anteversion. The hip is reduced with excellent stability with full extension and full external rotation, 70 degrees flexion with 40 degrees adduction and 90 degrees internal rotation and 90 degrees of flexion with 70 degrees of internal rotation. The operative leg is placed on top of the non-operative leg and the leg lengths are found to be equal. The trials are then removed and the permanent implant of the same size is impacted into the femoral canal. The ceramic femoral head of the same size as the trial is placed and the  hip is reduced with the same stability parameters. The operative leg is again placed on top of the non-operative leg and the leg lengths are found to be equal.      The wound is then copiously irrigated with saline solution and the capsule and short external rotators are re-attached to the femur through drill holes with Ethibond suture. The fascia lata is closed over a hemovac drain with #1 vicryl suture and the fascia lata, gluteal muscles and subcutaneous tissues  are injected with Exparel 20ml diluted with saline 50ml. The subcutaneous tissues are closed with #1 and2-0 vicryl and the subcuticular layer closed with running 4-0 Monocryl. The drain is hooked to suction, incision cleaned and dried, and steri-srips and a bulky sterile dressing applied. The limb is placed into a knee immobilizer and the patient is awakened and transported to recovery in stable condition.      Please note that a surgical assistant was a medical necessity for this procedure in order to perform it in a safe and expeditious manner. The assistant was necessary to provide retraction to the vital neurovascular structures and to retract and position the limb to allow for anatomic placement of the prosthetic components.  Gus Rankin Raynette Arras, MD    02/17/2012, 12:08 PM

## 2012-02-17 NOTE — Interval H&P Note (Signed)
History and Physical Interval Note:  02/17/2012 10:50 AM  Deborah Hudson  has presented today for surgery, with the diagnosis of osteoarthritis Right Hip   The various methods of treatment have been discussed with the patient and family. After consideration of risks, benefits and other options for treatment, the patient has consented to  Procedure(s) (LRB): TOTAL HIP ARTHROPLASTY (Right) as a surgical intervention .  The patients' history has been reviewed, patient examined, no change in status, stable for surgery.  I have reviewed the patients' chart and labs.  Questions were answered to the patient's satisfaction.     Loanne Drilling

## 2012-02-17 NOTE — Transfer of Care (Signed)
Immediate Anesthesia Transfer of Care Note  Patient: Deborah Hudson  Procedure(s) Performed: Procedure(s) (LRB): TOTAL HIP ARTHROPLASTY (Right)  Patient Location: PACU  Anesthesia Type: Spinal  Level of Consciousness: sedated, patient cooperative and responds to stimulaton  Airway & Oxygen Therapy: Patient Spontanous Breathing and Patient connected to face mask oxgen  Post-op Assessment: Report given to PACU RN and Post -op Vital signs reviewed and stable  Post vital signs: Reviewed and stable  Complications: No apparent anesthesia complications T-11 level on release to PACU staff.

## 2012-02-17 NOTE — Anesthesia Postprocedure Evaluation (Signed)
  Anesthesia Post-op Note  Patient: Deborah Hudson  Procedure(s) Performed: Procedure(s) (LRB): TOTAL HIP ARTHROPLASTY (Right)  Patient Location: PACU  Anesthesia Type: Spinal  Level of Consciousness: awake and alert   Airway and Oxygen Therapy: Patient Spontanous Breathing  Post-op Pain: mild  Post-op Assessment: Post-op Vital signs reviewed, Patient's Cardiovascular Status Stable, Respiratory Function Stable, Patent Airway and No signs of Nausea or vomiting  Post-op Vital Signs: stable  Complications: No apparent anesthesia complications

## 2012-02-17 NOTE — Anesthesia Procedure Notes (Signed)
Spinal  Patient location during procedure: OR Start time: 02/17/2012 11:15 AM End time: 02/17/2012 11:25 AM Staffing CRNA/Resident: Paris Lore Performed by: resident/CRNA  Preanesthetic Checklist Completed: patient identified, site marked, surgical consent, pre-op evaluation, timeout performed, IV checked, risks and benefits discussed and monitors and equipment checked Spinal Block Patient position: sitting Prep: Betadine Patient monitoring: heart rate, continuous pulse ox and blood pressure Location: L3-4 Injection technique: single-shot Needle Needle type: Spinocan and Whitacre  Needle gauge: 25 G Needle length: 9 cm Assessment Sensory level: T4 Additional Notes Expiration date of kit checked and confirmed. Patient tolerated procedure well, without complications. X 2 attempts first at L2-L3 without success, positive CSF return at L3-L4 no noted return of blood.

## 2012-02-18 LAB — CBC
HCT: 35.2 % — ABNORMAL LOW (ref 36.0–46.0)
Hemoglobin: 12.2 g/dL (ref 12.0–15.0)
MCH: 31.4 pg (ref 26.0–34.0)
MCHC: 34.7 g/dL (ref 30.0–36.0)
MCV: 90.5 fL (ref 78.0–100.0)
Platelets: 225 10*3/uL (ref 150–400)
RBC: 3.89 MIL/uL (ref 3.87–5.11)
RDW: 12.4 % (ref 11.5–15.5)
WBC: 12.1 10*3/uL — ABNORMAL HIGH (ref 4.0–10.5)

## 2012-02-18 LAB — BASIC METABOLIC PANEL
BUN: 11 mg/dL (ref 6–23)
CO2: 26 mEq/L (ref 19–32)
Calcium: 8.8 mg/dL (ref 8.4–10.5)
Chloride: 101 mEq/L (ref 96–112)
Creatinine, Ser: 0.68 mg/dL (ref 0.50–1.10)
GFR calc Af Amer: >90 mL/min (ref >90)
GFR calc non Af Amer: >90 mL/min (ref >90)
Glucose, Bld: 114 mg/dL — ABNORMAL HIGH (ref 70–99)
Potassium: 4 mEq/L (ref 3.5–5.1)
Sodium: 136 mEq/L (ref 135–145)

## 2012-02-18 MED ORDER — SODIUM CHLORIDE 0.9 % IV BOLUS (SEPSIS)
500.0000 mL | Freq: Once | INTRAVENOUS | Status: AC
  Administered 2012-02-18: 500 mL via INTRAVENOUS

## 2012-02-18 NOTE — Evaluation (Signed)
Physical Therapy Evaluation Patient Details Name: Deborah Hudson MRN: [id number] DOB: Apr 09, 1946 Today's Date: 02/18/2012  Problem List:  Patient Active Problem List  Diagnoses  . Arthritis  . Migraine headache  . Hyperlipidemia  . Frequent UTI  . Osteoarthritis of hip    Past Medical History:  Past Medical History  Diagnosis Date  . Arthritis     hips  . Hyperlipidemia     borderline  . Peripheral vascular disease     varicose veins/ with injections  . Migraine headache     migraines  . Hypoglycemia     from DECREASED PROTEIN INTAKE  . Bladder infection     frequent /with Macrodantin usage  PRN   Past Surgical History:  Past Surgical History  Procedure Date  . Breast surgery     bilateral breast lumpectomy  . Colonoscopy   . Toe surgery     right great toe scrapping for arthritis    PT Assessment/Plan/Recommendation PT Assessment Clinical Impression Statement: pt is s/p posterior R THA will benefit from PT to improve mobility rom, strength to DC to home PT Recommendation/Assessment: Patient will need skilled PT in the acute care venue PT Problem List: Decreased strength;Decreased range of motion;Decreased activity tolerance;Decreased knowledge of use of DME;Decreased safety awareness;Decreased knowledge of precautions;Pain PT Therapy Diagnosis : Difficulty walking;Acute pain PT Plan PT Frequency: 7X/week PT Treatment/Interventions: DME instruction;Gait training;Stair training;Functional mobility training;Patient/family education;Therapeutic activities PT Recommendation Recommendations for Other Services: OT consult Follow Up Recommendations: Home health PT Equipment Recommended: None recommended by PT PT Goals  Acute Rehab PT Goals PT Goal Formulation: With patient Time For Goal Achievement: 7 days Pt will go Supine/Side to Sit: with supervision PT Goal: Supine/Side to Sit - Progress: Goal set today Pt will go Sit to Supine/Side: with supervision PT Goal:  Sit to Supine/Side - Progress: Goal set today Pt will go Sit to Stand: with supervision PT Goal: Sit to Stand - Progress: Goal set today Pt will go Stand to Sit: with supervision PT Goal: Stand to Sit - Progress: Goal set today Pt will Ambulate: 51 - 150 feet;with supervision;with rolling walker PT Goal: Ambulate - Progress: Goal set today Pt will Go Up / Down Stairs: 6-9 stairs;with min assist;with least restrictive assistive device PT Goal: Up/Down Stairs - Progress: Goal set today Pt will Perform Home Exercise Program: with supervision, verbal cues required/provided PT Goal: Perform Home Exercise Program - Progress: Goal set today Additional Goals Additional Goal #1: demo/state 3/3 post/. hip precautions  PT Evaluation Precautions/Restrictions  Precautions Precautions: Posterior Hip Restrictions Weight Bearing Restrictions: Yes RLE Weight Bearing: Partial weight bearing RLE Partial Weight Bearing Percentage or Pounds: 25-50% Prior Functioning  Home Living Lives With: Spouse Receives Help From: Family Type of Home: House Home Layout: One level Home Access: Stairs to enter Entrance Stairs-Rails: Can reach both Entrance Stairs-Number of Steps: 6 Bathroom Toilet: Handicapped height Home Adaptive Equipment: Walker - rolling Prior Function Level of Independence: Independent with basic ADLs Able to Take Stairs?: Yes Driving: Yes Cognition Cognition Arousal/Alertness: Awake/alert Overall Cognitive Status: Appears within functional limits for tasks assessed Sensation/Coordination Sensation Light Touch: Appears Intact Extremity Assessment RLE Assessment RLE Assessment: Exceptions to [facility] RLE AROM (degrees) RLE Overall AROM Comments: tolerated hip flex to 7o RLE Strength RLE Overall Strength Comments: did not place weight through RLE LLE Assessment LLE Assessment: Within Functional Limits Mobility (including Balance) Bed Mobility Bed Mobility: Yes Supine to Sit: 3: Mod  assist;HOB elevated (Comment degrees) Supine to Sit Details (  indicate cue type and reason): used trapeze, vc for precautions Transfers Transfers: Yes Sit to Stand: 3: Mod assist;From bed;With upper extremity assist Sit to Stand Details (indicate cue type and reason): vc on technique and precautions Stand to Sit: 4: Min assist;To chair/3-in-1;With upper extremity assist Stand to Sit Details: vc for RLE placed and to reach back to recliner Ambulation/Gait Ambulation/Gait: Yes Ambulation/Gait Assistance: 1: +2 Total assist Ambulation/Gait Assistance Details (indicate cue type and reason): vc on sequence  and posture.  +1 for safety as pt c/oizziness. Ambulation Distance (Feet): 5 Feet Assistive device: Rolling walker Gait Pattern: Step-to pattern    Exercise  Total Joint Exercises Ankle Circles/Pumps: AROM;Right;10 reps Quad Sets: AROM;Right;10 reps Heel Slides: AAROM;Right;10 reps Hip ABduction/ADduction: AAROM;Right;10 reps End of Session PT - End of Session Activity Tolerance: Patient tolerated treatment well (limited by dizziness) Patient left: in chair;with call bell in reach;with family/visitor present Nurse Communication: Mobility status for transfers General Behavior During Session: [facility] for tasks performed Cognition: [facility] for tasks performed  Rada Hay 02/18/2012, 1:20 PM  [id number]

## 2012-02-18 NOTE — Progress Notes (Signed)
Physical Therapy Treatment Patient Details Name: Deborah Hudson MRN: [id number] DOB: 11/26/1946 Today's Date: 02/18/2012 [id number] PT Assessment/Plan  PT - Assessment/Plan Comments on Treatment Session: pt w/ no c/o dizziness, tolerated ambulation . PT Plan: Discharge plan remains appropriate PT Frequency: 7X/week Recommendations for Other Services: OT consult Follow Up Recommendations: Home health PT Equipment Recommended: None recommended by PT PT Goals  Acute Rehab PT Goals PT Goal Formulation: With patient Time For Goal Achievement: 7 days Pt will go Supine/Side to Sit: with supervision PT Goal: Supine/Side to Sit - Progress: Goal set today Pt will go Sit to Supine/Side: with supervision PT Goal: Sit to Supine/Side - Progress: Progressing toward goal Pt will go Sit to Stand: with supervision PT Goal: Sit to Stand - Progress: Progressing toward goal Pt will go Stand to Sit: with supervision PT Goal: Stand to Sit - Progress: Progressing toward goal Pt will Ambulate: 51 - 150 feet;with supervision;with rolling walker PT Goal: Ambulate - Progress: Progressing toward goal Pt will Go Up / Down Stairs: 6-9 stairs;with min assist;with least restrictive assistive device PT Goal: Up/Down Stairs - Progress: Goal set today Pt will Perform Home Exercise Program: with supervision, verbal cues required/provided PT Goal: Perform Home Exercise Program - Progress: Goal set today Additional Goals Additional Goal #1: demo/state 3/3 post/. hip precautions PT Goal: Additional Goal #1 - Progress: Progressing toward goal  PT Treatment Precautions/Restrictions  Precautions Precautions: Posterior Hip Restrictions Weight Bearing Restrictions: Yes RLE Weight Bearing: Partial weight bearing RLE Partial Weight Bearing Percentage or Pounds: 25-50% Mobility (including Balance) Bed Mobility Bed Mobility: Yes  Sit to Supine: 4: Min assist;HOB flat Sit to Supine - Details (indicate cue type and  reason): vc on how to turn  body, assistance for RLE placed onto bed, vc for precautions Transfers Transfers: Yes Sit to Stand: 4: Min assist;From chair/3-in-1;With upper extremity assist Sit to Stand Details (indicate cue type and reason): vc on technique and precautions Stand to Sit: 4: Min assist;To bed;With upper extremity assist Stand to Sit Details: vc on technique and precautions Ambulation/Gait Ambulation/Gait: Yes Ambulation/Gait Assistance: 1: +2 Total assist Ambulation/Gait Assistance Details (indicate cue type and reason): +2 for safety for dizziness. pt=90%, vc for PWB,sequence Ambulation Distance (Feet): 125 Feet Assistive device: Rolling walker Gait Pattern: Step-to pattern    Exercise   End of Session PT - End of Session Activity Tolerance: Patient tolerated treatment well Patient left: in bed;with call bell in reach Nurse Communication: Mobility status for transfers General Behavior During Session: [facility] for tasks performed Cognition: [facility] for tasks performed  Rada Hay 02/18/2012, 3:48 PM

## 2012-02-18 NOTE — Progress Notes (Signed)
Subjective: 1 Day Post-Op Procedure(s) (LRB): TOTAL HIP ARTHROPLASTY (Right) Patient reports pain as mild and moderate.   Patient seen in rounds with Dr. Lequita Halt. Patient has complaints of a "miseable" night with pain, nausea, and vomiting. We will start therapy today. Plan is to go home after hospital stay.  Objective: Vital signs in last 24 hours: Temp:  [97.2 F (36.2 C)-98.3 F (36.8 C)] 98.2 F (36.8 C) (03/21 0615) Pulse Rate:  [58-81] 71  (03/21 0615) Resp:  [8-20] 16  (03/21 0615) BP: (103-151)/(66-89) 103/66 mmHg (03/21 0615) SpO2:  [95 %-100 %] 98 % (03/21 0615) Weight:  [77 kg (169 lb 12.1 oz)] 77 kg (169 lb 12.1 oz) (03/20 1530)  Intake/Output from previous day:  Intake/Output Summary (Last 24 hours) at 02/18/12 0933 Last data filed at 02/18/12 0807  Gross per 24 hour  Intake 4720.67 ml  Output   2300 ml  Net 2420.67 ml    Intake/Output this shift: Total I/O In: 560 [P.O.:360; I.V.:200] Out: 275 [Urine:275]  Labs:  [facility] 02/18/12 0430  HGB 12.2    Basename 02/18/12 0430  WBC 12.1*  RBC 3.89  HCT 35.2*  PLT 225    Basename 02/18/12 0430  NA 136  K 4.0  CL 101  CO2 26  BUN 11  CREATININE 0.68  GLUCOSE 114*  CALCIUM 8.8   No results found for this basename: LABPT:2,INR:2 in the last 72 hours  Exam - Neurovascular intact Sensation intact distally Dressing - clean, dry, no drainage Motor function intact - moving foot and toes well on exam.  Hemovac pulled without difficulty.  Past Medical History  Diagnosis Date  . Arthritis     hips  . Hyperlipidemia     borderline  . Peripheral vascular disease     varicose veins/ with injections  . Migraine headache     migraines  . Hypoglycemia     from DECREASED PROTEIN INTAKE  . Bladder infection     frequent /with Macrodantin usage  PRN    Assessment/Plan: 1 Day Post-Op Procedure(s) (LRB): TOTAL HIP ARTHROPLASTY (Right) Principal Problem:  *Osteoarthritis of hip  Fluids for the  nausea and vomiting. Advance diet Up with therapy Continue foley due to strict I&O and urinary output monitoring Discharge home with home health  DVT Prophylaxis - Xarelto Protocol Partial-Weight Bearing 25-50% right Leg D/C Knee Immobilizer Hemovac Pulled Begin Therapy Hip Preacutions Keep foley until tomorrow. No vaccines.  Deborah Hudson 02/18/2012, 9:33 AM

## 2012-02-19 DIAGNOSIS — E871 Hypo-osmolality and hyponatremia: Secondary | ICD-10-CM | POA: Diagnosis not present

## 2012-02-19 LAB — CBC
HCT: 33.1 % — ABNORMAL LOW (ref 36.0–46.0)
Hemoglobin: 11.4 g/dL — ABNORMAL LOW (ref 12.0–15.0)
MCH: 31.8 pg (ref 26.0–34.0)
MCHC: 34.4 g/dL (ref 30.0–36.0)
MCV: 92.5 fL (ref 78.0–100.0)
Platelets: 195 10*3/uL (ref 150–400)
RBC: 3.58 MIL/uL — ABNORMAL LOW (ref 3.87–5.11)
RDW: 12.8 % (ref 11.5–15.5)
WBC: 11.1 10*3/uL — ABNORMAL HIGH (ref 4.0–10.5)

## 2012-02-19 LAB — BASIC METABOLIC PANEL
BUN: 12 mg/dL (ref 6–23)
CO2: 26 mEq/L (ref 19–32)
Calcium: 8.3 mg/dL — ABNORMAL LOW (ref 8.4–10.5)
Chloride: 102 mEq/L (ref 96–112)
Creatinine, Ser: 0.81 mg/dL (ref 0.50–1.10)
GFR calc Af Amer: 87 mL/min — ABNORMAL LOW (ref >90)
GFR calc non Af Amer: 75 mL/min — ABNORMAL LOW (ref >90)
Glucose, Bld: 109 mg/dL — ABNORMAL HIGH (ref 70–99)
Potassium: 3.7 mEq/L (ref 3.5–5.1)
Sodium: 134 mEq/L — ABNORMAL LOW (ref 135–145)

## 2012-02-19 MED ORDER — OXYCODONE HCL 5 MG PO TABS: 5.0000 mg | ORAL_TABLET | ORAL | Status: AC | PRN

## 2012-02-19 MED ORDER — RIVAROXABAN 10 MG PO TABS: 10.0000 mg | ORAL_TABLET | Freq: Every day | ORAL | Status: DC

## 2012-02-19 MED ORDER — METHOCARBAMOL 500 MG PO TABS: 500.0000 mg | ORAL_TABLET | Freq: Four times a day (QID) | ORAL | Status: AC | PRN

## 2012-02-19 NOTE — Progress Notes (Signed)
Subjective: 2 Days Post-Op Procedure(s) (LRB): TOTAL HIP ARTHROPLASTY (Right) Patient reports pain as mild.   Patient doing well.  Progressing with therapy.  Probably ready for home tomorrow.  Objective: Vital signs in last 24 hours: Temp:  [97.8 F (36.6 C)-99.3 F (37.4 C)] 99.2 F (37.3 C) (03/22 0630) Pulse Rate:  [81-95] 95  (03/22 0630) Resp:  [16] 16  (03/22 0630) BP: (100-121)/(63-72) 109/71 mmHg (03/22 0630) SpO2:  [93 %-98 %] 93 % (03/22 0630)  Intake/Output from previous day:  Intake/Output Summary (Last 24 hours) at 02/19/12 0831 Last data filed at 02/19/12 0630  Gross per 24 hour  Intake 1538.67 ml  Output   2950 ml  Net -1411.33 ml    Intake/Output this shift:    Labs:  Basename 02/19/12 0413 02/18/12 0430  HGB 11.4* 12.2    Basename 02/19/12 0413 02/18/12 0430  WBC 11.1* 12.1*  RBC 3.58* 3.89  HCT 33.1* 35.2*  PLT 195 225    Basename 02/19/12 0413 02/18/12 0430  NA 134* 136  K 3.7 4.0  CL 102 101  CO2 26 26  BUN 12 11  CREATININE 0.81 0.68  GLUCOSE 109* 114*  CALCIUM 8.3* 8.8   No results found for this basename: LABPT:2,INR:2 in the last 72 hours  Exam - Neurovascular intact Sensation intact distally Dressing/Incision - clean, dry, no drainage, healing Motor function intact - moving foot and toes well on exam.   Past Medical History  Diagnosis Date  . Arthritis     hips  . Hyperlipidemia     borderline  . Peripheral vascular disease     varicose veins/ with injections  . Migraine headache     migraines  . Hypoglycemia     from DECREASED PROTEIN INTAKE  . Bladder infection     frequent /with Macrodantin usage  PRN    Assessment/Plan: 2 Days Post-Op Procedure(s) (LRB): TOTAL HIP ARTHROPLASTY (Right) Principal Problem:  *Osteoarthritis of hip Active Problems:  Postop Hyponatremia   Advance diet Up with therapy Plan for discharge tomorrow Discharge home with home health  DVT Prophylaxis - Xarelto  Protocol Partial-Weight Bearing 25-50% right Leg  Medrith Veillon 02/19/2012, 8:31 AM

## 2012-02-19 NOTE — Discharge Instructions (Signed)
Hip Rehabilitation, Guidelines Following Surgery The results of a hip operation are greatly improved after range of motion and muscle strengthening exercises. Follow all safety measures which are given to protect your hip. If any of these exercises cause increased pain or swelling in your joint, decrease the amount until you are comfortable again. Then slowly increase the exercises. Call your caregiver if you have problems or questions. HOME CARE INSTRUCTIONS  Most of the following instructions are designed to prevent the dislocation of your new hip.  Do not put on socks or shoes without following the instructions of your caregivers.   Sit on high chairs so your hips are not bent more than 90 degrees.   Sit on chairs with arms. Use the chair arms to help push yourself up when arising.   Keep your leg on the side of the operation out in front of you when standing up.   Arrange for the use of a toilet seat elevator so you are not sitting low.   Do not do any exercises or get in any positions that cause your toes to point in (pigeon toed).   Always sleep with a pillow between your legs. Do not lie on your side in sleep with both knees touching the bed.   You may resume a sexual relationship in one month or when given the OK by your caregiver.   Use crutches or walker as long as suggested by your caregivers.   Begin weight bearing with your caregiver's approval.   Avoid periods of inactivity such as sitting longer than an hour when not asleep. This helps prevent blood clots.   Return to work as instructed by your caregiver.   Do not drive a car for 6 weeks or as instructed.   Do not drive while taking narcotics.   Wear elastic stockings until instructed not to.   Make sure you keep all of your appointments after your operation with all of your doctors and caregivers.  RANGE OF MOTION AND STRENGTHENING EXERCISES These exercises are designed to help you keep full movement of your hip  joint. Follow your caregiver's or physical therapist's instructions. Perform all exercises about fifteen times, three times per day or as directed. Exercise both hips, even if you have had only one joint replacement. These exercises can be done on a training (exercise) mat, on the floor, on a table or on a bed. Use whatever works the best and is most comfortable for you. Use music or television while you are exercising so that the exercises are a pleasant break in your day. This will make your life better with the exercises acting as a break in routine you can look forward to.  Lying on your back, slowly slide your foot toward your buttocks, raising your knee up off the floor. Then slowly slide your foot back down until your leg is straight again.   Lying on your back spread your legs as far apart as you can without causing discomfort.   Lying on your side, raise your upper leg and foot straight up from the floor as far as is comfortable. Slowly lower the leg and repeat.   Lying on your back, tighten up the muscle in the front of your thigh (quadriceps muscles). You can do this by keeping your leg straight and trying to raise your heel off the floor. This helps strengthen the largest muscle supporting your knee.   Lying on your back, tighten up the muscles of your buttocks both   with the legs straight and with the knee bent at a comfortable angle while keeping your heel on the floor.  Document Released: 06/19/2004 Document Revised: 11/05/2011 Document Reviewed: 06/06/2008 [employer] Patient Information 2012 [employer]  Pick up stool softner and laxative for home. Do not submerge incision under water. May shower. Continue to use ice for pain and swelling from surgery. Hip precautions.  Total Hip Protocol. 

## 2012-02-19 NOTE — Evaluation (Signed)
Occupational Therapy Evaluation Patient Details Name: Deborah Hudson MRN: [id number] DOB: 1946-05-29 Today's Date: 02/19/2012  Problem List:  Patient Active Problem List  Diagnoses  . Arthritis  . Migraine headache  . Hyperlipidemia  . Frequent UTI  . Osteoarthritis of hip  . Postop Hyponatremia    Past Medical History:  Past Medical History  Diagnosis Date  . Arthritis     hips  . Hyperlipidemia     borderline  . Peripheral vascular disease     varicose veins/ with injections  . Migraine headache     migraines  . Hypoglycemia     from DECREASED PROTEIN INTAKE  . Bladder infection     frequent /with Macrodantin usage  PRN   Past Surgical History:  Past Surgical History  Procedure Date  . Breast surgery     bilateral breast lumpectomy  . Colonoscopy   . Toe surgery     right great toe scrapping for arthritis    OT Assessment/Plan/Recommendation OT Assessment Clinical Impression Statement: Pt presents with s/p R THA and is limited by slight nausea and dizziness as well as weakness. Will benefit from skilled Ot to increase her safety and independence with all ADl for discharge home with family.  OT Recommendation/Assessment: Patient will need skilled OT in the acute care venue OT Problem List: Decreased strength;Decreased knowledge of use of DME or AE;Pain;Decreased knowledge of precautions OT Therapy Diagnosis : Generalized weakness;Acute pain OT Plan OT Frequency: Min 2X/week OT Treatment/Interventions: Self-care/ADL training;Therapeutic activities;DME and/or AE instruction;Patient/family education OT Recommendation Follow Up Recommendations: Home health OT Equipment Recommended: None recommended by PT;3 in 1 bedside comode Individuals Consulted Consulted and Agree with Results and Recommendations: Patient OT Goals Acute Rehab OT Goals OT Goal Formulation: With patient Time For Goal Achievement: 7 days ADL Goals Pt Will Perform Grooming: with  supervision;Standing at sink ADL Goal: Grooming - Progress: Goal set today Pt Will Perform Lower Body Bathing: with supervision;Sit to stand from chair;Sit to stand from bed;with adaptive equipment ADL Goal: Lower Body Bathing - Progress: Goal set today Pt Will Perform Lower Body Dressing: with supervision;Sit to stand from chair;Sit to stand from bed;with adaptive equipment ADL Goal: Lower Body Dressing - Progress: Goal set today Pt Will Transfer to Toilet: with supervision;Ambulation;with DME;3-in-1 ADL Goal: Toilet Transfer - Progress: Goal set today Pt Will Perform Toileting - Clothing Manipulation: with supervision;Standing ADL Goal: Toileting - Clothing Manipulation - Progress: Goal set today Pt Will Perform Toileting - Hygiene: with supervision;Standing at 3-in-1/toilet ADL Goal: Toileting - Hygiene - Progress: Goal set today Pt Will Perform Tub/Shower Transfer: with min assist;with DME ADL Goal: Tub/Shower Transfer - Progress: Goal set today  OT Evaluation Precautions/Restrictions  Precautions Precautions: Posterior Hip Restrictions Weight Bearing Restrictions: Yes RLE Weight Bearing: Partial weight bearing RLE Partial Weight Bearing Percentage or Pounds: 25-50% Prior Functioning Home Living Lives With: Spouse Receives Help From: Family Type of Home: House Home Layout: One level Home Access: Stairs to enter Entrance Stairs-Rails: Can reach both Entrance Stairs-Number of Steps: 6 Bathroom Shower/Tub: Health visitor: Handicapped height Home Adaptive Equipment: Environmental consultant - rolling;Long-handled shoehorn;Reacher;Sock aid Prior Function Level of Independence: Independent with basic ADLs Driving: Yes ADL ADL Eating/Feeding: Simulated;Independent Where Assessed - Eating/Feeding: Chair Grooming: Simulated;Wash/dry hands;Set up Where Assessed - Grooming: Sitting, chair Upper Body Bathing: Simulated;Right arm;Chest;Left arm;Abdomen;Set  up;Supervision/safety Upper Body Bathing Details (indicate cue type and reason): slightly dizzy EOB. resolved after a few minutes Where Assessed - Upper Body Bathing: Sitting, bed;Unsupported Lower Body  Bathing: Simulated;Moderate assistance Lower Body Bathing Details (indicate cue type and reason): without adaptive equipment Where Assessed - Lower Body Bathing: Sit to stand from bed Upper Body Dressing: Simulated;Supervision/safety;Set up Where Assessed - Upper Body Dressing: Sitting, bed;Unsupported Lower Body Dressing: Simulated;Maximal assistance Lower Body Dressing Details (indicate cue type and reason): without adaptive equipment. Pt also difficulty with L hip and states it has to be replaced also. Practiced doffing left sock as pt unable to pick up R foot off floor yet. Pt able to doff left sock with min assist and don with sock aid with min assist. demonstrated shoe hor and  reacher for pants, etc. Where Assessed - Lower Body Dressing: Sit to stand from bed Toilet Transfer: Simulated;Minimal assistance Toilet Transfer Details (indicate cue type and reason): pivot to chair only. pt nauseous once to chair. min verbal cues THPs Toilet Transfer Method: Stand pivot Toileting - Clothing Manipulation: Simulated;Minimal assistance Where Assessed - Toileting Clothing Manipulation: Standing Toileting - Hygiene: Simulated;Minimal assistance Where Assessed - Toileting Hygiene: Standing Tub/Shower Transfer: Not assessed Tub/Shower Transfer Method: Not assessed Equipment Used: Long-handled shoe horn;Long-handled sponge;Reacher;Sock aid;Rolling walker ADL Comments: Pt able to state 2/3 hip precautions and PWB. Pt limited by slight nausea and still painful/weak to lift R foot off floor with ADL (dressing). Pt issued elastic laces. Vision/Perception    Cognition Cognition Arousal/Alertness: Awake/alert Overall Cognitive Status: Appears within functional limits for tasks assessed Orientation Level:  Oriented X4 Sensation/Coordination Sensation Light Touch: Appears Intact Extremity Assessment RUE Assessment RUE Assessment: Within Functional Limits LUE Assessment LUE Assessment: Within Functional Limits Mobility  Bed Mobility Bed Mobility: Yes Supine to Sit: 4: Min assist;HOB flat Supine to Sit Details (indicate cue type and reason): no trapeze. min assist to guide R LE to EOB and min verbal cues for THPs Transfers Transfers: Yes Sit to Stand: 4: Min assist;From elevated surface;From bed;With upper extremity assist Sit to Stand Details (indicate cue type and reason): min verbal cues THPs Stand to Sit: 4: Min assist;With upper extremity assist;To elevated surface;To chair/3-in-1 Stand to Sit Details: min verbal cues Exercises   End of Session OT - End of Session Activity Tolerance: Other (comment) (slight nausea) Patient left: in chair;with call bell in reach General Behavior During Session: [facility] for tasks performed Cognition: [facility] for tasks performed   Lennox Laity [phone] 02/19/2012, 10:42 AM

## 2012-02-19 NOTE — Progress Notes (Signed)
Physical Therapy Treatment Patient Details Name: Dorreen Valiente MRN: [id number] DOB: Nov 21, 1946 Today's Date: 02/19/2012  PT Assessment/Plan  PT - Assessment/Plan Comments on Treatment Session: Pt with slight c/o of nausea upon returning to room after ambulation.  Pt states feeling subsided with rest.  Continues to progress with sequencing/technique and can recall 3/3 precautions.  PT Plan: Discharge plan remains appropriate PT Frequency: 7X/week Recommendations for Other Services: OT consult Follow Up Recommendations: Home health PT Equipment Recommended: None recommended by PT;3 in 1 bedside comode PT Goals  Acute Rehab PT Goals PT Goal Formulation: With patient Time For Goal Achievement: 7 days Pt will go Sit to Stand: with supervision PT Goal: Sit to Stand - Progress: Progressing toward goal Pt will go Stand to Sit: with supervision PT Goal: Stand to Sit - Progress: Progressing toward goal Pt will Ambulate: 51 - 150 feet;with supervision;with rolling walker PT Goal: Ambulate - Progress: Progressing toward goal Additional Goals Additional Goal #1: demo/state 3/3 post/. hip precautions PT Goal: Additional Goal #1 - Progress: Progressing toward goal  PT Treatment Precautions/Restrictions  Precautions Precautions: Posterior Hip Restrictions Weight Bearing Restrictions: Yes RLE Weight Bearing: Partial weight bearing RLE Partial Weight Bearing Percentage or Pounds: 25-50% Mobility (including Balance) Bed Mobility Bed Mobility: No (Pt was in recliner upon PT arrival) Supine to Sit: 4: Min assist;HOB flat Supine to Sit Details (indicate cue type and reason): no trapeze. min assist to guide R LE to EOB and min verbal cues for THPs Transfers Transfers: Yes Sit to Stand: 4: Min assist;With upper extremity assist;With armrests;From chair/3-in-1 Sit to Stand Details (indicate cue type and reason): Assist for forward weight shfit with cues for hand placement and to maintain hip  precautions.  Stand to Sit: 4: Min assist;With upper extremity assist;To elevated surface;To chair/3-in-1 Stand to Sit Details: Min/guard for safety with cues for hand placement and for RLE management.  Ambulation/Gait Ambulation/Gait: Yes Ambulation/Gait Assistance: 1: +2 Total assist;Patient percentage (comment) (+2 for safety (Pt assist 90%.)) Ambulation/Gait Assistance Details (indicate cue type and reason): Cues for upright posture, sequencing/technique with RW and for WB status.  Ambulation Distance (Feet): 70 Feet Assistive device: Rolling walker Gait Pattern: Step-to pattern Gait velocity: decreased Stairs: No    Exercise    End of Session PT - End of Session Activity Tolerance: Patient tolerated treatment well Patient left: in chair;with call bell in reach;with family/visitor present General Behavior During Session: [facility] for tasks performed Cognition: [facility] for tasks performed  Page, Meribeth Mattes 02/19/2012, 11:39 AM

## 2012-02-19 NOTE — Progress Notes (Signed)
Physical Therapy Treatment Patient Details Name: Deborah Hudson MRN: [id number] DOB: 07/30/1946 Today's Date: 02/19/2012  PT Assessment/Plan  PT - Assessment/Plan Comments on Treatment Session: Pt progressing well with ambulation/mobility.  Pt with no c/o nausea/dizziness this afternoon.  Will practice stairs tomorrow for D/C.   PT Plan: Discharge plan remains appropriate PT Frequency: 7X/week Follow Up Recommendations: Home health PT Equipment Recommended: None recommended by PT;3 in 1 bedside comode PT Goals  Acute Rehab PT Goals PT Goal Formulation: With patient Time For Goal Achievement: 7 days Pt will go Supine/Side to Sit: with supervision PT Goal: Supine/Side to Sit - Progress: Progressing toward goal Pt will go Sit to Supine/Side: with supervision PT Goal: Sit to Supine/Side - Progress: Progressing toward goal Pt will go Sit to Stand: with supervision PT Goal: Sit to Stand - Progress: Progressing toward goal Pt will go Stand to Sit: with supervision PT Goal: Stand to Sit - Progress: Progressing toward goal Pt will Ambulate: 51 - 150 feet;with supervision;with rolling walker PT Goal: Ambulate - Progress: Progressing toward goal Pt will Perform Home Exercise Program: with supervision, verbal cues required/provided PT Goal: Perform Home Exercise Program - Progress: Progressing toward goal Additional Goals Additional Goal #1: demo/state 3/3 post/. hip precautions PT Goal: Additional Goal #1 - Progress: Progressing toward goal  PT Treatment Precautions/Restrictions  Precautions Precautions: Posterior Hip Restrictions Weight Bearing Restrictions: Yes RLE Weight Bearing: Partial weight bearing RLE Partial Weight Bearing Percentage or Pounds: 25-50% Mobility (including Balance) Bed Mobility Bed Mobility: Yes Supine to Sit: 4: Min assist;HOB flat Supine to Sit Details (indicate cue type and reason): Requires min assist for RLE off of bed with cues for hand placement and  maintaining hip precautions.  Sit to Supine: 4: Min assist;HOB flat Sit to Supine - Details (indicate cue type and reason): Assist for RLE into bed with cues for hand placement/technique Transfers Transfers: Yes Sit to Stand: 4: Min assist;From elevated surface;With upper extremity assist;With armrests;From chair/3-in-1;From bed Sit to Stand Details (indicate cue type and reason): Assist for forward weight shfit with cues for hand placement and to maintain hip precautions.  (Performed x 2 from bed and from 3in1.  ) Stand to Sit: 4: Min assist;With upper extremity assist;To elevated surface;To chair/3-in-1;To bed Stand to Sit Details: Min/guard for safety with cues for hand placement and for RLE management.  (Performed x 2 to 3in1 and to bed) Ambulation/Gait Ambulation/Gait: Yes Ambulation/Gait Assistance: 4: Min assist Ambulation/Gait Assistance Details (indicate cue type and reason): Min/guard for safety wtih cues for upright posture and cues for precautions when turning.  Ambulation Distance (Feet): 10 Feet (then another 10) Assistive device: Rolling walker Gait Pattern: Step-to pattern Gait velocity: decreased    Exercise  Total Joint Exercises Ankle Circles/Pumps: AROM;Right;10 reps Quad Sets: AROM;Right;10 reps Heel Slides: AAROM;Right;10 reps Hip ABduction/ADduction: AAROM;Right;10 reps End of Session PT - End of Session Activity Tolerance: Patient tolerated treatment well Patient left: in bed;with call bell in reach;with family/visitor present General Behavior During Session: [facility] for tasks performed Cognition: [facility] for tasks performed  Page, Meribeth Mattes 02/19/2012, 4:08 PM

## 2012-02-19 NOTE — Progress Notes (Signed)
CARE MANAGEMENT NOTE 02/19/2012  Patient:  Deborah Hudson, Deborah Hudson   Account Number:  [id number]  Date Initiated:  02/19/2012  Documentation initiated by:  Colleen Can  Subjective/Objective Assessment:   DX osteoarthritis rt hip; total hip replacemnt     Action/Plan:   CM spoke with patient and family members. Plans are for patient to return to her home in [place] ,[place] where spouse will be caregiver. Already has access to RW but needs 3N1   Anticipated DC Date:  02/19/2012   Anticipated DC Plan:  HOME W HOME HEALTH SERVICES  In-house referral  Clinical Social Worker      DC Associate Professor  CM consult      [facility] Choice  HOME HEALTH  DURABLE MEDICAL EQUIPMENT   Choice offered to / List presented to:  C-1 Patient   DME arranged  NA      DME agency  NA     HH arranged  HH-2 PT      [facility] agency  [facility]   Status of service:  In process, will continue to follow Medicare Important Message given?   (If response is "NO", the following Medicare IM given date fields will be blank) Comments:  02/19/2012 Raynelle Bring BSN CCM [phone] Offered HH choice-spouse chose [place]. Services were pre-arranged prior to admission to hospital. [facility] will provide HHpt and arrange for needed DME. HH services will start day after discharge 02/21/2012. List of agencies for home health services placed in shadow chart.

## 2012-02-20 LAB — CBC
HCT: 33.9 % — ABNORMAL LOW (ref 36.0–46.0)
Hemoglobin: 11.6 g/dL — ABNORMAL LOW (ref 12.0–15.0)
MCH: 31.5 pg (ref 26.0–34.0)
MCHC: 34.2 g/dL (ref 30.0–36.0)
MCV: 92.1 fL (ref 78.0–100.0)
Platelets: 196 10*3/uL (ref 150–400)
RBC: 3.68 MIL/uL — ABNORMAL LOW (ref 3.87–5.11)
RDW: 12.7 % (ref 11.5–15.5)
WBC: 11.5 10*3/uL — ABNORMAL HIGH (ref 4.0–10.5)

## 2012-02-20 LAB — BASIC METABOLIC PANEL
BUN: 11 mg/dL (ref 6–23)
CO2: 27 mEq/L (ref 19–32)
Calcium: 8.9 mg/dL (ref 8.4–10.5)
Chloride: 100 mEq/L (ref 96–112)
Creatinine, Ser: 0.68 mg/dL (ref 0.50–1.10)
GFR calc Af Amer: >90 mL/min (ref >90)
GFR calc non Af Amer: >90 mL/min (ref >90)
Glucose, Bld: 119 mg/dL — ABNORMAL HIGH (ref 70–99)
Potassium: 3.7 mEq/L (ref 3.5–5.1)
Sodium: 134 mEq/L — ABNORMAL LOW (ref 135–145)

## 2012-02-20 NOTE — Progress Notes (Signed)
Physical Therapy Treatment Patient Details Name: Deborah Hudson MRN: [id number] DOB: October 19, 1946 Today's Date: 02/20/2012  PT Assessment/Plan  PT - Assessment/Plan Comments on Treatment Session: Pt continues to progress with gait pattern.  Demos good technique with stair negotiation with husband and daughter present for education.  Pt may want to practice stairs in am before D/C.  PT Plan: Discharge plan remains appropriate PT Frequency: 7X/week Follow Up Recommendations: Home health PT Equipment Recommended: 3 in 1 bedside comode PT Goals  Acute Rehab PT Goals PT Goal Formulation: With patient Time For Goal Achievement: 7 days Pt will go Sit to Supine/Side: with supervision PT Goal: Sit to Supine/Side - Progress: Progressing toward goal Pt will go Sit to Stand: with supervision PT Goal: Sit to Stand - Progress: Met Pt will go Stand to Sit: with supervision PT Goal: Stand to Sit - Progress: Met Pt will Ambulate: 51 - 150 feet;with supervision;with rolling walker PT Goal: Ambulate - Progress: Met Pt will Go Up / Down Stairs: 6-9 stairs;with min assist;with least restrictive assistive device PT Goal: Up/Down Stairs - Progress: Progressing toward goal Pt will Perform Home Exercise Program: with supervision, verbal cues required/provided PT Goal: Perform Home Exercise Program - Progress: Progressing toward goal  PT Treatment Precautions/Restrictions  Precautions Precautions: Posterior Hip Restrictions Weight Bearing Restrictions: Yes RLE Weight Bearing: Partial weight bearing RLE Partial Weight Bearing Percentage or Pounds: 25-50 Mobility (including Balance) Bed Mobility Bed Mobility: Yes Sit to Supine: 4: Min assist;HOB flat Sit to Supine - Details (indicate cue type and reason): Assist for RLE into bed with cues for hand placement/technique Transfers Transfers: Yes Sit to Stand: 5: Supervision;From chair/3-in-1;From elevated surface;With armrests Sit to Stand Details  (indicate cue type and reason): Supervision for safety with cues for hand placement on arm rests.  Stand to Sit: 5: Supervision;With upper extremity assist;To elevated surface;To bed Stand to Sit Details: Supervision for safety with min cues for hand placement.  Ambulation/Gait Ambulation/Gait: Yes Ambulation/Gait Assistance: 5: Supervision Ambulation/Gait Assistance Details (indicate cue type and reason): Supervision for safety with min cues for upright posture. Pt demos much improved technique with some cuing for heel to toe gait pattern on RLE. Ambulation Distance (Feet): 40 Feet Assistive device: Rolling walker Gait Pattern: Step-to pattern Gait velocity: decreased Stairs: Yes Stairs Assistance: 4: Min assist Stairs Assistance Details (indicate cue type and reason): Requires cuing for sequencing/technique.  Initially went up stairs sideways with rail and then trialed rail and crutch.  Pt demos improved technique with rail and crutch.   Stair Management Technique: One rail Right;With crutches;Sideways;Forwards (one crutch) Number of Stairs: 4  Height of Stairs: 6     Exercise  Total Joint Exercises Ankle Circles/Pumps: AROM;Right;10 reps Quad Sets: AROM;Right;10 reps Short Arc Quad: AROM;Right;10 reps;Supine Heel Slides: AAROM;Right;10 reps Hip ABduction/ADduction: AAROM;Right;10 reps End of Session PT - End of Session Activity Tolerance: Patient tolerated treatment well Patient left: in bed;with call bell in reach;with family/visitor present General Behavior During Session: [name] for tasks performed Cognition: [name] for tasks performed  Deborah Hudson, Deborah Hudson 02/20/2012, 2:36 PM

## 2012-02-20 NOTE — Progress Notes (Signed)
Cm spoke with pt concerning dc planning. Pt confirmed [facility] to provide [facility] services. CM contacted [place] concerning delivery of 3n1 to room prior to discharge. Pt's spouse present at bedside during interview, spouse to assist in home care.   Leonie Green [phone]

## 2012-02-20 NOTE — Progress Notes (Signed)
Occupational Therapy Treatment Patient Details Name: Deborah Hudson MRN: [id number] DOB: [birth year] Today's Date: 02/20/2012  OT Assessment/Plan OT Assessment/Plan Comments on Treatment Session: Pt doing well. Plan is for f/u with HHOT. OT Plan: Discharge plan remains appropriate OT Frequency: Min 2X/week Follow Up Recommendations: Home health OT Equipment Recommended: 3 in 1 bedside comode OT Goals ADL Goals ADL Goal: Lower Body Dressing - Progress: Progressing toward goals ADL Goal: Toilet Transfer - Progress: Progressing toward goals ADL Goal: Toileting - Clothing Manipulation - Progress: Progressing toward goals ADL Goal: Toileting - Hygiene - Progress: Progressing toward goals ADL Goal: Tub/Shower Transfer - Progress: Progressing toward goals  OT Treatment Precautions/Restrictions  Precautions Precautions: Posterior Hip Restrictions Weight Bearing Restrictions: Yes RLE Weight Bearing: Partial weight bearing RLE Partial Weight Bearing Percentage or Pounds: 25-50   ADL ADL Lower Body Dressing Details (indicate cue type and reason): Educated pt in the use of all AE for LB ADLs. Pt stated her husband would likely help her. Toilet Transfer: Radiographer, therapeutic Method: Proofreader: Raised toilet seat with arms (or 3-in-1 over toilet) Toileting - Clothing Manipulation: Supervision/safety Where Assessed - Toileting Clothing Manipulation: Sit to stand from 3-in-1 or toilet Toileting - Hygiene: Supervision/safety Where Assessed - Toileting Hygiene: Sit to stand from 3-in-1 or toilet Tub/Shower Transfer: Minimal assistance Tub/Shower Transfer Method: Ambulating Equipment Used: Rolling walker;Sock aid;Reacher (BSC) Mobility  Bed Mobility Bed Mobility: Yes Supine to Sit: 4: Min assist;HOB flat Supine to Sit Details (indicate cue type and reason): Requires min assist for RLE off of bed .  Pt demos good UE placement, min cues for positioning  on EOB.  Transfers Sit to Stand: 5: Supervision;From chair/3-in-1;From elevated surface;With armrests Sit to Stand Details (indicate cue type and reason): Supervision for safety with cues for hand placement on bed.  (Performed x 2 from bed and from  3in1) Stand to Sit: 5: Supervision;With upper extremity assist;To chair/3-in-1;With armrests Stand to Sit Details: Supervision for safety with min cues for hand placement.  Pt demos good technique with LE management.  Exercises    End of Session OT - End of Session Activity Tolerance: Patient tolerated treatment well Patient left: in chair;with call bell in reach;with family/visitor present General Behavior During Session: [facility] for tasks performed Cognition: [facility] for tasks performed  Tyauna Lacaze A OTR/L [phone] 02/20/2012, 10:01 AM

## 2012-02-20 NOTE — Progress Notes (Signed)
Physical Therapy Treatment Patient Details Name: Deborah Hudson MRN: [id number] DOB: 1945/12/30 Today's Date: 02/20/2012  PT Assessment/Plan  PT - Assessment/Plan Comments on Treatment Session: Pt with much improved sequencing/technique with RW during am session with increased distance.  Pt to possibly D/C today, will stair train this pm.   PT Plan: Discharge plan remains appropriate PT Frequency: 7X/week Follow Up Recommendations: Home health PT Equipment Recommended: None recommended by PT;3 in 1 bedside comode PT Goals  Acute Rehab PT Goals PT Goal Formulation: With patient Time For Goal Achievement: 7 days Pt will go Supine/Side to Sit: with supervision PT Goal: Supine/Side to Sit - Progress: Progressing toward goal Pt will go Sit to Stand: with supervision PT Goal: Sit to Stand - Progress: Met Pt will go Stand to Sit: with supervision PT Goal: Stand to Sit - Progress: Met Pt will Ambulate: 51 - 150 feet;with supervision;with rolling walker PT Goal: Ambulate - Progress: Met Additional Goals Additional Goal #1: demo/state 3/3 post/. hip precautions PT Goal: Additional Goal #1 - Progress: Progressing toward goal  PT Treatment Precautions/Restrictions  Precautions Precautions: Posterior Hip Restrictions Weight Bearing Restrictions: Yes RLE Weight Bearing: Partial weight bearing RLE Partial Weight Bearing Percentage or Pounds: 25-50% Mobility (including Balance) Bed Mobility Bed Mobility: Yes Supine to Sit: 4: Min assist;HOB flat Supine to Sit Details (indicate cue type and reason): Requires min assist for RLE off of bed .  Pt demos good UE placement, min cues for positioning on EOB.  Transfers Transfers: Yes Sit to Stand: 5: Supervision;From elevated surface;With upper extremity assist;From bed Sit to Stand Details (indicate cue type and reason): Supervision for safety with cues for hand placement on bed.  (Performed x 2 from bed and from  3in1) Stand to Sit: 5:  Supervision;With upper extremity assist;With armrests;To chair/3-in-1 Stand to Sit Details: Supervision for safety with min cues for hand placement.  Pt demos good technique with LE management.  Ambulation/Gait Ambulation/Gait: Yes Ambulation/Gait Assistance: 5: Supervision Ambulation/Gait Assistance Details (indicate cue type and reason): Supervision for safety with min cues for upright posture.  Pt demos much improved technique with some cuing for heel to toe gait pattern on RLE. Ambulation Distance (Feet): 160 Feet Assistive device: Rolling walker Gait Pattern: Step-to pattern Gait velocity: decreased Stairs: No    Exercise    End of Session PT - End of Session Activity Tolerance: Patient tolerated treatment well Patient left: in chair;with call bell in reach;with family/visitor present General Behavior During Session: [facility] for tasks performed Cognition: [facility] for tasks performed  Page, Meribeth Mattes 02/20/2012, 9:57 AM

## 2012-02-20 NOTE — Progress Notes (Signed)
Subjective: 3 Days Post-Op Procedure(s) (LRB): TOTAL HIP ARTHROPLASTY (Right) Patient reports pain as mild.  She is slow to progress with PT Denies CP or SOB.  Voiding without difficulty. Positive flatus. Objective: Vital signs in last 24 hours: Temp:  [98.2 F (36.8 C)-99.1 F (37.3 C)] 98.7 F (37.1 C) (03/23 0510) Pulse Rate:  [93-97] 93  (03/23 0510) Resp:  [14-16] 14  (03/23 0510) BP: (109-126)/(74-83) 109/74 mmHg (03/23 0510) SpO2:  [93 %-95 %] 95 % (03/23 0510)  Intake/Output from previous day: 03/22 0701 - 03/23 0700 In: 1560 [P.O.:1560] Out: 700 [Urine:700] Intake/Output this shift:     Basename 02/20/12 0445 02/19/12 0413 02/18/12 0430  HGB 11.6* 11.4* 12.2    Basename 02/20/12 0445 02/19/12 0413  WBC 11.5* 11.1*  RBC 3.68* 3.58*  HCT 33.9* 33.1*  PLT 196 195    Basename 02/20/12 0445 02/19/12 0413  NA 134* 134*  K 3.7 3.7  CL 100 102  CO2 27 26  BUN 11 12  CREATININE 0.68 0.81  GLUCOSE 119* 109*  CALCIUM 8.9 8.3*   No results found for this basename: LABPT:2,INR:2 in the last 72 hours  Neurologically intact Neurovascular intact Sensation intact distally Dorsiflexion/Plantar flexion intact Incision: dressing C/D/I Compartment soft  Assessment/Plan: 3 Days Post-Op Procedure(s) (LRB): TOTAL HIP ARTHROPLASTY (Right) Up with therapy Will keep today to see if she does a little better with PT hopeful d/c tomorrow Cont current care  Rosalynn Sergent R. 02/20/2012, 7:15 AM

## 2012-02-21 NOTE — Progress Notes (Signed)
Pt stable, scripts, and d/c instructions given with no questions/concerns voiced by patient.  Transported via wheelchair to private vehicle by NT and family.

## 2012-02-21 NOTE — Progress Notes (Signed)
Physical Therapy Treatment Patient Details Name: Deborah Hudson MRN: [id number] DOB: 11-15-1946 Today's Date: 02/21/2012 [phone] PT Assessment/Plan  PT - Assessment/Plan Comments on Treatment Session: pt states she is discouraged w/ 3/10 pain. encouraged pt to walk frequently at home. pt is ready for DC PT Plan: Discharge plan remains appropriate PT Goals  Acute Rehab PT Goals Pt will go Supine/Side to Sit: with supervision PT Goal: Supine/Side to Sit - Progress: Progressing toward goal Pt will go Sit to Supine/Side: with supervision PT Goal: Sit to Supine/Side - Progress: Progressing toward goal Pt will go Sit to Stand: with supervision PT Goal: Sit to Stand - Progress: Progressing toward goal Pt will go Stand to Sit: with supervision PT Goal: Stand to Sit - Progress: Progressing toward goal Pt will Ambulate: 51 - 150 feet;with supervision;with rolling walker PT Goal: Ambulate - Progress: Progressing toward goal Pt will Go Up / Down Stairs: 6-9 stairs;with min assist;with least restrictive assistive device (pt practiced only 2 steps) PT Goal: Up/Down Stairs - Progress: Progressing toward goal  PT Treatment Precautions/Restrictions  Precautions Precautions: Posterior Hip Restrictions Weight Bearing Restrictions: Yes RLE Weight Bearing: Partial weight bearing RLE Partial Weight Bearing Percentage or Pounds: 25-50 Mobility (including Balance) Bed Mobility Supine to Sit: 5: Supervision Supine to Sit Details (indicate cue type and reason): family assisting pt Transfers Sit to Stand: 5: Supervision;From bed Stand to Sit Details: family took over care Ambulation/Gait Ambulation/Gait Assistance: 5: Supervision Ambulation/Gait Assistance Details (indicate cue type and reason): vc for sequence and posture Ambulation Distance (Feet): 100 Feet Assistive device: Rolling walker Gait Pattern: Step-to pattern Gait velocity: decreased Stairs: Yes Stairs Assistance: 4: Min  assist Stairs Assistance Details (indicate cue type and reason): spouse present to review Stair Management Technique: One rail Right;With crutches;Forwards Number of Stairs: 2  Height of Stairs: 6     Exercise    End of Session PT - End of Session Activity Tolerance: Patient tolerated treatment well Patient left: with family/visitor present Nurse Communication: Mobility status for transfers General Behavior During Session: [facility] for tasks performed Cognition: [facility] for tasks performed  Rada Hay 02/21/2012, 2:49 PM

## 2012-02-21 NOTE — Progress Notes (Signed)
Orthopedics Progress Note  Subjective: Pt sitting up comfortably in no acute distress "im ready to go home"  Objective:  Filed Vitals:   02/21/12 0545  BP: 119/75  Pulse: 66  Temp: 98.2 F (36.8 C)  Resp: 20    General: Awake and alert  Musculoskeletal: right hip incision healing well, no erythema, no drainage, nv intact distally Neurovascularly intact  Lab Results  Component Value Date   WBC 11.5* 02/20/2012   HGB 11.6* 02/20/2012   HCT 33.9* 02/20/2012   MCV 92.1 02/20/2012   PLT 196 02/20/2012       Component Value Date/Time   NA 134* 02/20/2012 0445   K 3.7 02/20/2012 0445   CL 100 02/20/2012 0445   CO2 27 02/20/2012 0445   GLUCOSE 119* 02/20/2012 0445   BUN 11 02/20/2012 0445   CREATININE 0.68 02/20/2012 0445   CREATININE 0.98 10/09/2011 0832   CALCIUM 8.9 02/20/2012 0445   GFRNONAA >90 02/20/2012 0445   GFRAA >90 02/20/2012 0445    Lab Results  Component Value Date   INR 1.01 02/12/2012    Assessment/Plan: POD #4 s/p Procedure(s): right TOTAL HIP ARTHROPLASTY  Discharge home today PT/OT F/u in 2 weeks  Almedia Balls. Ranell Patrick, MD 02/21/2012 7:26 AM

## 2012-02-23 NOTE — Discharge Summary (Signed)
Physician Discharge Summary  Patient ID: Deborah Hudson MRN: [id number] DOB/AGE: 01/19/1946 66 y.o.  Admit date: 02/17/2012 Discharge date: 02/23/2012  Admission Diagnoses:  Principal Problem:  *Osteoarthritis of hip Active Problems:  Postop Hyponatremia   Discharge Diagnoses:  Same   Surgeries: Procedure(s): TOTAL HIP ARTHROPLASTY on 02/17/2012   Consultants: PT/OT  Discharged Condition: Stable  Hospital Course: Deborah Hudson is an 66 y.o. female who was admitted 02/17/2012 with a chief complaint of No chief complaint on file. , and found to have a diagnosis of Osteoarthritis of hip.  They were brought to the operating room on 02/17/2012 and underwent the above named procedures.    The patient had an uncomplicated hospital course and was stable for discharge.  Recent vital signs:  Filed Vitals:   02/21/12 0545  BP: 119/75  Pulse: 66  Temp: 98.2 F (36.8 C)  Resp: 20    Recent laboratory studies:  Results for orders placed during the hospital encounter of 02/17/12  TYPE AND SCREEN      Component Value Range   ABO/RH(D) B POS     Antibody Screen NEG     Sample Expiration 02/20/2012    ABO/RH      Component Value Range   ABO/RH(D) B POS    CBC      Component Value Range   WBC 12.1 (*) 4.0 - 10.5 (K/uL)   RBC 3.89  3.87 - 5.11 (MIL/uL)   Hemoglobin 12.2  12.0 - 15.0 (g/dL)   HCT 47.8 (*) 29.5 - 46.0 (%)   MCV 90.5  78.0 - 100.0 (fL)   MCH 31.4  26.0 - 34.0 (pg)   MCHC 34.7  30.0 - 36.0 (g/dL)   RDW 62.1  30.8 - 65.7 (%)   Platelets 225  150 - 400 (K/uL)  BASIC METABOLIC PANEL      Component Value Range   Sodium 136  135 - 145 (mEq/L)   Potassium 4.0  3.5 - 5.1 (mEq/L)   Chloride 101  96 - 112 (mEq/L)   CO2 26  19 - 32 (mEq/L)   Glucose, Bld 114 (*) 70 - 99 (mg/dL)   BUN 11  6 - 23 (mg/dL)   Creatinine, Ser 8.46  0.50 - 1.10 (mg/dL)   Calcium 8.8  8.4 - 96.2 (mg/dL)   GFR calc non Af Amer >90  >90 (mL/min)   GFR calc Af Amer >90  >90 (mL/min)  CBC     Component Value Range   WBC 11.1 (*) 4.0 - 10.5 (K/uL)   RBC 3.58 (*) 3.87 - 5.11 (MIL/uL)   Hemoglobin 11.4 (*) 12.0 - 15.0 (g/dL)   HCT 95.2 (*) 84.1 - 46.0 (%)   MCV 92.5  78.0 - 100.0 (fL)   MCH 31.8  26.0 - 34.0 (pg)   MCHC 34.4  30.0 - 36.0 (g/dL)   RDW 32.4  40.1 - 02.7 (%)   Platelets 195  150 - 400 (K/uL)  BASIC METABOLIC PANEL      Component Value Range   Sodium 134 (*) 135 - 145 (mEq/L)   Potassium 3.7  3.5 - 5.1 (mEq/L)   Chloride 102  96 - 112 (mEq/L)   CO2 26  19 - 32 (mEq/L)   Glucose, Bld 109 (*) 70 - 99 (mg/dL)   BUN 12  6 - 23 (mg/dL)   Creatinine, Ser 2.53  0.50 - 1.10 (mg/dL)   Calcium 8.3 (*) 8.4 - 10.5 (mg/dL)   GFR calc non Af Amer 75 (*) >  90 (mL/min)   GFR calc Af Amer 87 (*) >90 (mL/min)  CBC      Component Value Range   WBC 11.5 (*) 4.0 - 10.5 (K/uL)   RBC 3.68 (*) 3.87 - 5.11 (MIL/uL)   Hemoglobin 11.6 (*) 12.0 - 15.0 (g/dL)   HCT 16.1 (*) 09.6 - 46.0 (%)   MCV 92.1  78.0 - 100.0 (fL)   MCH 31.5  26.0 - 34.0 (pg)   MCHC 34.2  30.0 - 36.0 (g/dL)   RDW 04.5  40.9 - 81.1 (%)   Platelets 196  150 - 400 (K/uL)  BASIC METABOLIC PANEL      Component Value Range   Sodium 134 (*) 135 - 145 (mEq/L)   Potassium 3.7  3.5 - 5.1 (mEq/L)   Chloride 100  96 - 112 (mEq/L)   CO2 27  19 - 32 (mEq/L)   Glucose, Bld 119 (*) 70 - 99 (mg/dL)   BUN 11  6 - 23 (mg/dL)   Creatinine, Ser 9.14  0.50 - 1.10 (mg/dL)   Calcium 8.9  8.4 - 78.2 (mg/dL)   GFR calc non Af Amer >90  >90 (mL/min)   GFR calc Af Amer >90  >90 (mL/min)    Discharge Medications:   Medication List  As of 02/23/2012  8:29 AM   STOP taking these medications         ADVIL PM PO      b complex vitamins tablet      BUTCHERS BROOM PO      CALCIUM & MAGNESIUM CARBONATES PO      CALCIUM 500 +D PO      diphenhydramine-acetaminophen 25-500 MG Tabs      Glucosamine Chondr 500 Complex Caps      ibuprofen 200 MG tablet      omega-3 acid ethyl esters 1 G capsule      vitamin C 1000 MG tablet           TAKE these medications         methocarbamol 500 MG tablet   Commonly known as: ROBAXIN   Take 1 tablet (500 mg total) by mouth every 6 (six) hours as needed.      nitrofurantoin 100 MG capsule   Commonly known as: MACRODANTIN   Take 100 mg by mouth as needed. After intercourse for UTI prevention        oxyCODONE 5 MG immediate release tablet   Commonly known as: Oxy IR/ROXICODONE   Take 1-2 tablets (5-10 mg total) by mouth every 3 (three) hours as needed.      rivaroxaban 10 MG Tabs tablet   Commonly known as: XARELTO   Take 1 tablet (10 mg total) by mouth daily with breakfast.      SUMAtriptan 100 MG tablet   Commonly known as: IMITREX   Take 100 mg by mouth every 2 (two) hours as needed. For migraine            Diagnostic Studies: X-ray Hip Right Ap And Lateral  02/12/2012  *RADIOLOGY REPORT*  Clinical Data: Right hip pain.  Osteoarthritis.  RIGHT HIP - COMPLETE 2+ VIEW  Comparison: None  Findings: The patient has severe osteoarthritis of the right hip with superior and medial joint space narrowing and osteophyte formation on the femoral head and the acetabulum.  There is medial joint space narrowing of the left hip.  IMPRESSION: Severe osteoarthritis of the right hip.  Original Report Authenticated By: Gwynn Burly, M.D.  Dg Pelvis Portable  02/17/2012  *RADIOLOGY REPORT*  Clinical Data: 66 year old female status post hip surgery.  PORTABLE PELVIS  Comparison: Preoperative study 02/12/2012.  Findings: Portable AP view the pelvis at 1313 hours.  Sequelae of right total hip arthroplasty.  Postoperative drain in place.  The right femoral component is incompletely visualized, but the visualized hardware appears intact and alignment is within normal limits.  No unexpected osseous changes.  Stable pelvis otherwise.  IMPRESSION: Right total hip arthroplasty with no adverse features.  Original Report Authenticated By: Harley Hallmark, M.D.   Dg Hip Portable 1 View  Right  02/17/2012  *RADIOLOGY REPORT*  Clinical Data: 66 year old female status post right hip surgery.  PORTABLE RIGHT HIP - 1 VIEW  Comparison: 1313 hours the same day and earlier.  Findings: Portable AP view of the right hip.  Sequelae of total hip arthroplasty.  Postoperative drain in place.  Postoperative changes to the overlying soft tissues.  Hardware components appear intact with normal alignment on the single view.  No unexpected osseous changes.  IMPRESSION: Right hip arthroplasty with no adverse features.  Original Report Authenticated By: Harley Hallmark, M.D.    Disposition: 06-Home-Health Care Svc  Discharge Orders    Future Orders Please Complete By Expires   Diet - low sodium heart healthy      Diet - low sodium heart healthy      Call MD / Call 911      Comments:   If you experience chest pain or shortness of breath, CALL 911 and be transported to the hospital emergency room.  If you develope a fever above 101 F, pus (white drainage) or increased drainage or redness at the wound, or calf pain, call your surgeon's office.   Constipation Prevention      Comments:   Drink plenty of fluids.  Prune juice may be helpful.  You may use a stool softener, such as Colace (over the counter) 100 mg twice a day.  Use MiraLax (over the counter) for constipation as needed.   Increase activity slowly as tolerated      Weight Bearing as taught in Physical Therapy      Comments:   Use a walker or crutches as instructed.   Discharge instructions      Comments:   Pick up stool softner and laxative for home. Do not submerge incision under water. May shower. Continue to use ice for pain and swelling from surgery. Hip precautions.  Total Hip Protocol.   Driving restrictions      Comments:   No driving   Lifting restrictions      Comments:   No lifting   Follow the hip precautions as taught in Physical Therapy      Change dressing      Comments:   You may change your dressing  dressing  daily with sterile 4 x 4 inch gauze dressing and paper tape.   TED hose      Comments:   Use stockings (TED hose) for 3 weeks on both leg(s).  You may remove them at night for sleeping.   Call MD / Call 911      Comments:   If you experience chest pain or shortness of breath, CALL 911 and be transported to the hospital emergency room.  If you develope a fever above 101 F, pus (white drainage) or increased drainage or redness at the wound, or calf pain, call your surgeon's office.   Constipation Prevention  Comments:   Drink plenty of fluids.  Prune juice may be helpful.  You may use a stool softener, such as Colace (over the counter) 100 mg twice a day.  Use MiraLax (over the counter) for constipation as needed.   Increase activity slowly as tolerated      Weight Bearing as taught in Physical Therapy      Comments:   Use a walker or crutches as instructed.   Follow the hip precautions as taught in Physical Therapy         Follow-up Information    Follow up with Loanne Drilling, MD. Schedule an appointment as soon as possible for a visit in 2 weeks. (Follow up the week up april 1-5.)    Contact information:   [facility] [address] [place] [address] [phone]           Signed: Thea Gist 02/23/2012, 8:29 AM

## 2012-02-26 ENCOUNTER — Encounter (HOSPITAL_COMMUNITY): Payer: Self-pay | Admitting: Orthopedic Surgery

## 2012-08-09 ENCOUNTER — Other Ambulatory Visit: Payer: Self-pay | Admitting: Gastroenterology

## 2013-11-30 HISTORY — PX: SHOULDER SURGERY: SHX246

## 2014-03-28 DIAGNOSIS — H1851 Endothelial corneal dystrophy: Secondary | ICD-10-CM

## 2014-03-28 DIAGNOSIS — H18519 Endothelial corneal dystrophy, unspecified eye: Secondary | ICD-10-CM | POA: Insufficient documentation

## 2014-11-30 DIAGNOSIS — N390 Urinary tract infection, site not specified: Secondary | ICD-10-CM | POA: Diagnosis not present

## 2014-12-25 DIAGNOSIS — Z6825 Body mass index (BMI) 25.0-25.9, adult: Secondary | ICD-10-CM | POA: Diagnosis not present

## 2014-12-25 DIAGNOSIS — M65811 Other synovitis and tenosynovitis, right shoulder: Secondary | ICD-10-CM | POA: Diagnosis not present

## 2014-12-25 DIAGNOSIS — R252 Cramp and spasm: Secondary | ICD-10-CM | POA: Diagnosis not present

## 2014-12-28 DIAGNOSIS — M7712 Lateral epicondylitis, left elbow: Secondary | ICD-10-CM | POA: Diagnosis not present

## 2014-12-31 DIAGNOSIS — N3001 Acute cystitis with hematuria: Secondary | ICD-10-CM | POA: Diagnosis not present

## 2014-12-31 DIAGNOSIS — G43709 Chronic migraine without aura, not intractable, without status migrainosus: Secondary | ICD-10-CM | POA: Diagnosis not present

## 2014-12-31 DIAGNOSIS — E663 Overweight: Secondary | ICD-10-CM | POA: Diagnosis not present

## 2014-12-31 DIAGNOSIS — Z6825 Body mass index (BMI) 25.0-25.9, adult: Secondary | ICD-10-CM | POA: Diagnosis not present

## 2014-12-31 DIAGNOSIS — R252 Cramp and spasm: Secondary | ICD-10-CM | POA: Diagnosis not present

## 2015-01-02 DIAGNOSIS — N6019 Diffuse cystic mastopathy of unspecified breast: Secondary | ICD-10-CM | POA: Diagnosis not present

## 2015-02-18 DIAGNOSIS — M25611 Stiffness of right shoulder, not elsewhere classified: Secondary | ICD-10-CM | POA: Diagnosis not present

## 2015-02-26 DIAGNOSIS — M25611 Stiffness of right shoulder, not elsewhere classified: Secondary | ICD-10-CM | POA: Diagnosis not present

## 2015-03-05 DIAGNOSIS — M25611 Stiffness of right shoulder, not elsewhere classified: Secondary | ICD-10-CM | POA: Diagnosis not present

## 2015-03-08 DIAGNOSIS — M25611 Stiffness of right shoulder, not elsewhere classified: Secondary | ICD-10-CM | POA: Diagnosis not present

## 2015-03-13 DIAGNOSIS — M25611 Stiffness of right shoulder, not elsewhere classified: Secondary | ICD-10-CM | POA: Diagnosis not present

## 2015-03-18 DIAGNOSIS — M25611 Stiffness of right shoulder, not elsewhere classified: Secondary | ICD-10-CM | POA: Diagnosis not present

## 2015-03-26 DIAGNOSIS — M25611 Stiffness of right shoulder, not elsewhere classified: Secondary | ICD-10-CM | POA: Diagnosis not present

## 2015-03-28 DIAGNOSIS — M25611 Stiffness of right shoulder, not elsewhere classified: Secondary | ICD-10-CM | POA: Diagnosis not present

## 2015-05-13 DIAGNOSIS — L659 Nonscarring hair loss, unspecified: Secondary | ICD-10-CM | POA: Diagnosis not present

## 2015-05-29 DIAGNOSIS — M25552 Pain in left hip: Secondary | ICD-10-CM | POA: Diagnosis not present

## 2015-06-10 DIAGNOSIS — D2272 Melanocytic nevi of left lower limb, including hip: Secondary | ICD-10-CM | POA: Diagnosis not present

## 2015-06-10 DIAGNOSIS — L812 Freckles: Secondary | ICD-10-CM | POA: Diagnosis not present

## 2015-06-10 DIAGNOSIS — D1801 Hemangioma of skin and subcutaneous tissue: Secondary | ICD-10-CM | POA: Diagnosis not present

## 2015-06-10 DIAGNOSIS — L821 Other seborrheic keratosis: Secondary | ICD-10-CM | POA: Diagnosis not present

## 2015-06-10 DIAGNOSIS — L304 Erythema intertrigo: Secondary | ICD-10-CM | POA: Diagnosis not present

## 2015-06-10 DIAGNOSIS — D2271 Melanocytic nevi of right lower limb, including hip: Secondary | ICD-10-CM | POA: Diagnosis not present

## 2015-06-11 DIAGNOSIS — Z86010 Personal history of colonic polyps: Secondary | ICD-10-CM | POA: Diagnosis not present

## 2015-06-11 DIAGNOSIS — Z09 Encounter for follow-up examination after completed treatment for conditions other than malignant neoplasm: Secondary | ICD-10-CM | POA: Diagnosis not present

## 2015-06-11 LAB — HM COLONOSCOPY

## 2015-08-06 DIAGNOSIS — N3001 Acute cystitis with hematuria: Secondary | ICD-10-CM | POA: Diagnosis not present

## 2015-08-06 DIAGNOSIS — R05 Cough: Secondary | ICD-10-CM | POA: Diagnosis not present

## 2015-08-06 DIAGNOSIS — Z23 Encounter for immunization: Secondary | ICD-10-CM | POA: Diagnosis not present

## 2015-10-04 DIAGNOSIS — H26492 Other secondary cataract, left eye: Secondary | ICD-10-CM | POA: Diagnosis not present

## 2015-10-04 DIAGNOSIS — H04123 Dry eye syndrome of bilateral lacrimal glands: Secondary | ICD-10-CM | POA: Diagnosis not present

## 2015-10-04 DIAGNOSIS — H1851 Endothelial corneal dystrophy: Secondary | ICD-10-CM | POA: Diagnosis not present

## 2015-10-04 DIAGNOSIS — Z947 Corneal transplant status: Secondary | ICD-10-CM | POA: Diagnosis not present

## 2015-10-19 DIAGNOSIS — N390 Urinary tract infection, site not specified: Secondary | ICD-10-CM | POA: Diagnosis not present

## 2015-10-21 DIAGNOSIS — N3281 Overactive bladder: Secondary | ICD-10-CM | POA: Diagnosis not present

## 2015-10-21 DIAGNOSIS — Z1231 Encounter for screening mammogram for malignant neoplasm of breast: Secondary | ICD-10-CM | POA: Diagnosis not present

## 2015-10-21 DIAGNOSIS — N390 Urinary tract infection, site not specified: Secondary | ICD-10-CM | POA: Diagnosis not present

## 2015-10-21 DIAGNOSIS — N952 Postmenopausal atrophic vaginitis: Secondary | ICD-10-CM | POA: Diagnosis not present

## 2015-10-21 DIAGNOSIS — N959 Unspecified menopausal and perimenopausal disorder: Secondary | ICD-10-CM | POA: Diagnosis not present

## 2015-11-18 ENCOUNTER — Encounter: Payer: Self-pay | Admitting: Internal Medicine

## 2015-11-18 ENCOUNTER — Ambulatory Visit (INDEPENDENT_AMBULATORY_CARE_PROVIDER_SITE_OTHER): Payer: Medicare Other | Admitting: Internal Medicine

## 2015-11-18 VITALS — BP 126/96 | HR 78 | Temp 98.3°F | Resp 16 | Wt 177.0 lb

## 2015-11-18 DIAGNOSIS — E162 Hypoglycemia, unspecified: Secondary | ICD-10-CM | POA: Insufficient documentation

## 2015-11-18 DIAGNOSIS — N390 Urinary tract infection, site not specified: Secondary | ICD-10-CM

## 2015-11-18 DIAGNOSIS — G43009 Migraine without aura, not intractable, without status migrainosus: Secondary | ICD-10-CM

## 2015-11-18 DIAGNOSIS — K8020 Calculus of gallbladder without cholecystitis without obstruction: Secondary | ICD-10-CM | POA: Diagnosis not present

## 2015-11-18 DIAGNOSIS — I10 Essential (primary) hypertension: Secondary | ICD-10-CM | POA: Diagnosis not present

## 2015-11-18 DIAGNOSIS — Z23 Encounter for immunization: Secondary | ICD-10-CM

## 2015-11-18 DIAGNOSIS — R32 Unspecified urinary incontinence: Secondary | ICD-10-CM

## 2015-11-18 MED ORDER — ESTRADIOL 0.1 MG/GM VA CREA: 1.0000 | TOPICAL_CREAM | Freq: Every day | VAGINAL | Status: DC

## 2015-11-18 NOTE — Progress Notes (Signed)
Subjective:    Patient ID: Deborah Hudson, female    DOB: [birth year], 69 y.o.   MRN: [id number]  HPI She is here to establish with a new pcp.   Gallstones:  She has gallstones and has had one self diagnosed attack. She does note feeling this is an issue, but wanted to make me aware.  Frequent UTI's:  She has had at least one every 8 weeks.  She sees gyn regularly.  She has tried physical therapy without success. Her muscles are weak in the pelvis and her organs have dropped. She recently saw her gynecologist and he recommended an estrogen ring, surgery or possibly estrogen cream. He did not think and she did not think the estrogen ring with standing place. She has been taking ciprofloxacin for her urinary tract infections and it has been successful - taking it for 10 day dosing.    Urinary incontinence: She takes Vesicare daily.  Hypertension: She is taking her medication daily. She is compliant with a low sodium diet.  She denies chest pain, palpitations, edema, shortness of breath and lightheadedness. She is exercising regularly.  She does not monitor her blood pressure at home.    Migraine headaches:  Undifferentiated.  She has been to the headache clinic.  They headaches started 12 years ago.  Nothing helped.  She is taking elavil and topamax and self decreased the dose because she was experiencing headaches. They are decreasing the number of headaches she gets and now she averages one headache every 6 weeks. She does not typically get aura or nausea. Migraine can last up to 2 1/2 days.  She first tries an otc medication and if that does not work she uses the imitrex.  She takes one sumatriptan one every other month.    She exercises 3-4 times a week.    Medications and allergies reviewed with patient and updated if appropriate.  Patient Active Problem List   Diagnosis Date Noted  . Postop Hyponatremia 02/19/2012  . Osteoarthritis of hip 02/17/2012  . Frequent UTI 10/05/2011  .  Arthritis   . Migraine headache   . Hyperlipidemia       Medication List       amitriptyline 25 MG tablet  Commonly known as:  ELAVIL  Take 25 mg by mouth at bedtime. Take 1/2 tablet daily     chlorthalidone 25 MG tablet  Commonly known as:  HYGROTON  Take 25 mg by mouth daily.     estradiol 0.1 MG/GM vaginal cream  Commonly known as:  ESTRACE VAGINAL  Place 1 Applicatorful vaginally at bedtime. For two weeks then twice a week     nitrofurantoin 100 MG capsule  Commonly known as:  MACRODANTIN  Take 50 mg by mouth as needed. After intercourse for UTI prevention     solifenacin 10 MG tablet  Commonly known as:  VESICARE  Take 5 mg by mouth daily.     SUMAtriptan 100 MG tablet  Commonly known as:  IMITREX  Take 100 mg by mouth every 2 (two) hours as needed. For migraine     topiramate 25 MG tablet  Commonly known as:  TOPAMAX  Take 25 mg by mouth daily.         Past Medical History  Diagnosis Date  . Arthritis     hips  . Hyperlipidemia     borderline  . Peripheral vascular disease ([place])     varicose veins/ with injections  . Migraine headache  migraines  . Hypoglycemia     from DECREASED PROTEIN INTAKE  . Bladder infection     frequent /with Macrodantin usage  PRN    Past Surgical History  Procedure Laterality Date  . Breast surgery      bilateral breast lumpectomy  . Colonoscopy    . Toe surgery      right great toe scrapping for arthritis  . Total hip arthroplasty  02/17/2012    Procedure: TOTAL HIP ARTHROPLASTY;  Surgeon: Gearlean Alf, MD;  Location: [facility]S;  Service: Orthopedics;  Laterality: Right;    Social History   Social History  . Marital Status: Married    Spouse Name: N/A  . Number of Children: N/A  . Years of Education: N/A   Social History Main Topics  . Smoking status: Never Smoker   . Smokeless tobacco: Never Used  . Alcohol Use: 1.2 oz/week    2 Glasses of wine per week  . Drug Use: No  . Sexual Activity: Yes    Other Topics Concern  . None   Social History Narrative    Review of Systems  Constitutional: Negative for fever and chills.  HENT: Negative for congestion, sinus pressure and sore throat.   Respiratory: Negative for cough, shortness of breath and wheezing.   Cardiovascular: Negative for chest pain, palpitations and leg swelling.  Gastrointestinal: Negative for nausea and abdominal pain.       No GERD  Genitourinary: Negative for dysuria, frequency, hematuria and difficulty urinating.  Musculoskeletal: Positive for arthralgias.  Neurological: Positive for headaches. Negative for dizziness and light-headedness.       Objective:   Filed Vitals:   11/18/15 1459  BP: 126/96  Pulse: 78  Temp: 98.3 F (36.8 C)  Resp: 16   Filed Weights   11/18/15 1459  Weight: 177 lb (80.287 kg)   Body mass index is 26.92 kg/(m^2).   Physical Exam  Constitutional: She is oriented to person, place, and time. She appears well-developed and well-nourished. No distress.  HENT:  Head: Normocephalic and atraumatic.  Eyes: Conjunctivae are normal.  Neck: Neck supple. No JVD present. No tracheal deviation present. No thyromegaly present.  No carotid bruit  Cardiovascular: Normal rate, normal heart sounds and intact distal pulses.   No murmur heard. Pulmonary/Chest: Effort normal and breath sounds normal. No respiratory distress. She has no wheezes. She has no rales.  Abdominal: She exhibits no distension. There is no tenderness.  Musculoskeletal: She exhibits no edema.  Lymphadenopathy:    She has no cervical adenopathy.  Neurological: She is alert and oriented to person, place, and time.  Skin: Skin is warm and dry. No rash noted. She is not diaphoretic.  Psychiatric: She has a normal mood and affect. Her behavior is normal.          Assessment & Plan:   See Problem List.   Screening blood work ordered

## 2015-11-18 NOTE — Assessment & Plan Note (Signed)
Controlled with Vesicare daily

## 2015-11-18 NOTE — Assessment & Plan Note (Signed)
One self-diagnosed attack Observation only

## 2015-11-18 NOTE — Progress Notes (Signed)
Pre visit review using our clinic review tool, if applicable. No additional management support is needed unless otherwise documented below in the visit note. 

## 2015-11-18 NOTE — Assessment & Plan Note (Signed)
Has seen the headache clinic, but is currently not following her Taking amitriptyline and Topamax daily, which does decrease the frequency of her headaches Takes over-the-counter pain medication for Imitrex as needed-typically takes one Imitrex every other month She understands the risks of taking Imitrex and this does work and she wants to continue to take it as infrequently as possible Continue current medications

## 2015-11-18 NOTE — Assessment & Plan Note (Signed)
Controlled with diet Increased her protein

## 2015-11-18 NOTE — Assessment & Plan Note (Signed)
BP Readings from Last 3 Encounters:  11/18/15 126/96  02/21/12 119/75  02/12/12 143/94   Slightly elevated here today, but she feels it is well controlled at home Continue monitoring at home Continue chlorthalidone at current dose Check CMP

## 2015-11-18 NOTE — Patient Instructions (Addendum)
  We have reviewed your prior records including labs and tests today.  Test(s) ordered today. Your results will be released to [place] (or called to you) after review, usually within 72hours after test completion. If any changes need to be made, you will be notified at that same time.  All other Health Maintenance issues reviewed.   All recommended immunizations and age-appropriate screenings are up-to-date.  prevnar vaccine administered today.   Medications reviewed and updated.  We will try topical estrogen cream to see if that helps prevent the UTI's  Your prescription(s) have been submitted to your pharmacy. Please take as directed and contact our office if you believe you are having problem(s) with the medication(s).

## 2015-11-18 NOTE — Assessment & Plan Note (Signed)
Continue nitrofurantoin after intercourse Most of her infections are probably related to vaginal atrophy and changes in her pH Discussed options She is interested in trying to vaginal cream-I will prescribe, but if it is effective she will need to get this prescription for her gynecologist Call with any questions or concerns

## 2015-11-19 DIAGNOSIS — Z23 Encounter for immunization: Secondary | ICD-10-CM | POA: Diagnosis not present

## 2015-11-19 NOTE — Addendum Note (Signed)
Addended by: Terence Lux B on: 11/19/2015 08:04 AM   Modules accepted: Orders

## 2015-11-20 ENCOUNTER — Other Ambulatory Visit (INDEPENDENT_AMBULATORY_CARE_PROVIDER_SITE_OTHER): Payer: Medicare Other

## 2015-11-20 DIAGNOSIS — I10 Essential (primary) hypertension: Secondary | ICD-10-CM | POA: Diagnosis not present

## 2015-11-20 DIAGNOSIS — G43009 Migraine without aura, not intractable, without status migrainosus: Secondary | ICD-10-CM

## 2015-11-20 DIAGNOSIS — E876 Hypokalemia: Secondary | ICD-10-CM

## 2015-11-20 DIAGNOSIS — E162 Hypoglycemia, unspecified: Secondary | ICD-10-CM

## 2015-11-20 LAB — CBC WITH DIFFERENTIAL/PLATELET
Basophils Absolute: 0.1 10*3/uL (ref 0.0–0.1)
Basophils Relative: 0.7 % (ref 0.0–3.0)
Eosinophils Absolute: 0.3 10*3/uL (ref 0.0–0.7)
Eosinophils Relative: 3.2 % (ref 0.0–5.0)
HCT: 44.7 % (ref 36.0–46.0)
Hemoglobin: 15.1 g/dL — ABNORMAL HIGH (ref 12.0–15.0)
Lymphocytes Relative: 42.6 % (ref 12.0–46.0)
Lymphs Abs: 3.4 10*3/uL (ref 0.7–4.0)
MCHC: 33.9 g/dL (ref 30.0–36.0)
MCV: 90.8 fl (ref 78.0–100.0)
Monocytes Absolute: 0.9 10*3/uL (ref 0.1–1.0)
Monocytes Relative: 10.9 % (ref 3.0–12.0)
Neutro Abs: 3.4 10*3/uL (ref 1.4–7.7)
Neutrophils Relative %: 42.6 % — ABNORMAL LOW (ref 43.0–77.0)
Platelets: 307 10*3/uL (ref 150.0–400.0)
RBC: 4.92 Mil/uL (ref 3.87–5.11)
RDW: 12.8 % (ref 11.5–15.5)
WBC: 7.9 10*3/uL (ref 4.0–10.5)

## 2015-11-20 LAB — LIPID PANEL
Cholesterol: 217 mg/dL — ABNORMAL HIGH (ref 0–200)
HDL: 55.8 mg/dL (ref >39.00)
LDL Cholesterol: 145 mg/dL — ABNORMAL HIGH (ref 0–99)
NonHDL: 161.11
Total CHOL/HDL Ratio: 4
Triglycerides: 82 mg/dL (ref 0.0–149.0)
VLDL: 16.4 mg/dL (ref 0.0–40.0)

## 2015-11-20 LAB — COMPREHENSIVE METABOLIC PANEL
ALT: 14 U/L (ref 0–35)
AST: 18 U/L (ref 0–37)
Albumin: 4.1 g/dL (ref 3.5–5.2)
Alkaline Phosphatase: 65 U/L (ref 39–117)
BUN: 15 mg/dL (ref 6–23)
CO2: 31 mEq/L (ref 19–32)
Calcium: 9.5 mg/dL (ref 8.4–10.5)
Chloride: 100 mEq/L (ref 96–112)
Creatinine, Ser: 0.89 mg/dL (ref 0.40–1.20)
GFR: 66.93 mL/min (ref >60.00)
Glucose, Bld: 100 mg/dL — ABNORMAL HIGH (ref 70–99)
Potassium: 3.2 mEq/L — ABNORMAL LOW (ref 3.5–5.1)
Sodium: 141 mEq/L (ref 135–145)
Total Bilirubin: 0.5 mg/dL (ref 0.2–1.2)
Total Protein: 7 g/dL (ref 6.0–8.3)

## 2015-11-20 LAB — TSH: TSH: 2.73 u[IU]/mL (ref 0.35–4.50)

## 2015-11-20 LAB — HEMOGLOBIN A1C: Hgb A1c MFr Bld: 5.7 % (ref 4.6–6.5)

## 2015-12-04 ENCOUNTER — Other Ambulatory Visit: Payer: Self-pay | Admitting: Internal Medicine

## 2015-12-04 ENCOUNTER — Telehealth: Payer: Self-pay | Admitting: Internal Medicine

## 2015-12-04 DIAGNOSIS — R300 Dysuria: Secondary | ICD-10-CM

## 2015-12-04 NOTE — Telephone Encounter (Signed)
Pt called in and wants to know if Dr burns can put order in for a urine test because she thinks she has a uti.  She is going to lab tomorrow

## 2015-12-05 ENCOUNTER — Other Ambulatory Visit (INDEPENDENT_AMBULATORY_CARE_PROVIDER_SITE_OTHER): Payer: Medicare Other

## 2015-12-05 DIAGNOSIS — E876 Hypokalemia: Secondary | ICD-10-CM

## 2015-12-05 LAB — BASIC METABOLIC PANEL
BUN: 17 mg/dL (ref 6–23)
CO2: 32 mEq/L (ref 19–32)
Calcium: 9.6 mg/dL (ref 8.4–10.5)
Chloride: 100 mEq/L (ref 96–112)
Creatinine, Ser: 0.9 mg/dL (ref 0.40–1.20)
GFR: 66.07 mL/min (ref >60.00)
Glucose, Bld: 95 mg/dL (ref 70–99)
Potassium: 3 mEq/L — ABNORMAL LOW (ref 3.5–5.1)
Sodium: 142 mEq/L (ref 135–145)

## 2015-12-05 NOTE — Telephone Encounter (Signed)
Yes, UA, Cx

## 2015-12-05 NOTE — Telephone Encounter (Signed)
Pt came into lab this morning for blood work. Is it okay to put orders in for UA?

## 2015-12-06 ENCOUNTER — Other Ambulatory Visit: Payer: Medicare Other

## 2015-12-06 DIAGNOSIS — R300 Dysuria: Secondary | ICD-10-CM

## 2015-12-09 ENCOUNTER — Other Ambulatory Visit: Payer: Self-pay | Admitting: Internal Medicine

## 2015-12-09 ENCOUNTER — Telehealth: Payer: Self-pay | Admitting: Emergency Medicine

## 2015-12-09 LAB — URINE CULTURE: Colony Count: >=100000

## 2015-12-09 MED ORDER — CIPROFLOXACIN HCL 250 MG PO TABS: 250.0000 mg | ORAL_TABLET | Freq: Two times a day (BID) | ORAL | Status: DC

## 2015-12-09 MED ORDER — SUMATRIPTAN SUCCINATE 100 MG PO TABS: 100.0000 mg | ORAL_TABLET | ORAL | Status: DC | PRN

## 2015-12-09 MED ORDER — SOLIFENACIN SUCCINATE 10 MG PO TABS: 5.0000 mg | ORAL_TABLET | Freq: Every day | ORAL | Status: DC

## 2015-12-09 MED ORDER — POTASSIUM CHLORIDE CRYS ER 20 MEQ PO TBCR: 20.0000 meq | EXTENDED_RELEASE_TABLET | Freq: Every day | ORAL | Status: DC

## 2015-12-09 MED ORDER — CHLORTHALIDONE 25 MG PO TABS: 25.0000 mg | ORAL_TABLET | Freq: Every day | ORAL | Status: DC

## 2015-12-09 MED ORDER — AMITRIPTYLINE HCL 25 MG PO TABS: 25.0000 mg | ORAL_TABLET | Freq: Every day | ORAL | Status: DC

## 2015-12-09 NOTE — Telephone Encounter (Signed)
Sent!

## 2015-12-09 NOTE — Telephone Encounter (Signed)
Please send in Potassium

## 2015-12-11 ENCOUNTER — Other Ambulatory Visit: Payer: Self-pay | Admitting: Emergency Medicine

## 2015-12-11 MED ORDER — TOPIRAMATE 25 MG PO TABS: 25.0000 mg | ORAL_TABLET | Freq: Every day | ORAL | Status: DC

## 2015-12-18 ENCOUNTER — Telehealth: Payer: Self-pay | Admitting: Internal Medicine

## 2015-12-18 NOTE — Telephone Encounter (Signed)
Patient is requesting that you give her a call regarding  estradiol (ESTRACE VAGINAL) 0.1 MG/GM vaginal cream [id number] . She states that she is having concerns about the internal applicator. She fears that she may have done harm. Please give the patient a call

## 2015-12-19 NOTE — Telephone Encounter (Signed)
Spoke with pt to inform.  

## 2015-12-19 NOTE — Telephone Encounter (Signed)
Pt would like to verify her RX. She is being told by the pharmacist something different that what the rx says that we sent in. Will send in new RX if needed.

## 2015-12-19 NOTE — Telephone Encounter (Signed)
2-4 grams daily for 2 weeks then 1 gram twice a week.  ( can do only 2 grams for the first two weeks -- sometimes gyn gives different doses based on their exam so it is hard for me to know exactly how much she truly needs)

## 2016-01-03 DIAGNOSIS — N3000 Acute cystitis without hematuria: Secondary | ICD-10-CM | POA: Diagnosis not present

## 2016-03-13 ENCOUNTER — Other Ambulatory Visit: Payer: Self-pay | Admitting: Internal Medicine

## 2016-05-14 ENCOUNTER — Other Ambulatory Visit: Payer: Self-pay | Admitting: Internal Medicine

## 2016-06-08 DIAGNOSIS — L821 Other seborrheic keratosis: Secondary | ICD-10-CM | POA: Diagnosis not present

## 2016-06-08 DIAGNOSIS — D1801 Hemangioma of skin and subcutaneous tissue: Secondary | ICD-10-CM | POA: Diagnosis not present

## 2016-06-08 DIAGNOSIS — D171 Benign lipomatous neoplasm of skin and subcutaneous tissue of trunk: Secondary | ICD-10-CM | POA: Diagnosis not present

## 2016-06-08 DIAGNOSIS — D225 Melanocytic nevi of trunk: Secondary | ICD-10-CM | POA: Diagnosis not present

## 2016-06-08 DIAGNOSIS — D1721 Benign lipomatous neoplasm of skin and subcutaneous tissue of right arm: Secondary | ICD-10-CM | POA: Diagnosis not present

## 2016-06-08 DIAGNOSIS — L720 Epidermal cyst: Secondary | ICD-10-CM | POA: Diagnosis not present

## 2016-06-08 DIAGNOSIS — L812 Freckles: Secondary | ICD-10-CM | POA: Diagnosis not present

## 2016-06-10 DIAGNOSIS — N390 Urinary tract infection, site not specified: Secondary | ICD-10-CM | POA: Diagnosis not present

## 2016-06-19 DIAGNOSIS — N3001 Acute cystitis with hematuria: Secondary | ICD-10-CM | POA: Diagnosis not present

## 2016-08-07 ENCOUNTER — Telehealth: Payer: Self-pay | Admitting: Emergency Medicine

## 2016-08-07 MED ORDER — NITROFURANTOIN MACROCRYSTAL 50 MG PO CAPS: 50.0000 mg | ORAL_CAPSULE | Freq: Every day | ORAL | 5 refills | Status: DC | PRN

## 2016-08-07 NOTE — Telephone Encounter (Signed)
Does she think she has an infection?  What symptoms does she have?

## 2016-08-07 NOTE — Telephone Encounter (Signed)
Please advise 

## 2016-08-07 NOTE — Addendum Note (Signed)
Addended by: Binnie Rail on: 08/07/2016 03:13 PM   Modules accepted: Orders

## 2016-08-07 NOTE — Telephone Encounter (Signed)
Spoke with pt and she states that she takes one Macrodantin after intercourse to prevent UTIs.

## 2016-08-07 NOTE — Telephone Encounter (Signed)
sent 

## 2016-08-07 NOTE — Telephone Encounter (Signed)
Pt called and needs a prescription refill on nitrofurantoin (MACRODANTIN) 100 MG capsule. Pharmacy is [facility]. Thanks.

## 2016-09-12 ENCOUNTER — Other Ambulatory Visit: Payer: Self-pay | Admitting: Internal Medicine

## 2016-09-19 DIAGNOSIS — Z23 Encounter for immunization: Secondary | ICD-10-CM | POA: Diagnosis not present

## 2016-09-22 DIAGNOSIS — H26492 Other secondary cataract, left eye: Secondary | ICD-10-CM | POA: Diagnosis not present

## 2016-11-10 DIAGNOSIS — Z1231 Encounter for screening mammogram for malignant neoplasm of breast: Secondary | ICD-10-CM | POA: Diagnosis not present

## 2016-11-10 DIAGNOSIS — Z01411 Encounter for gynecological examination (general) (routine) with abnormal findings: Secondary | ICD-10-CM | POA: Diagnosis not present

## 2016-11-10 DIAGNOSIS — N390 Urinary tract infection, site not specified: Secondary | ICD-10-CM | POA: Diagnosis not present

## 2016-11-10 DIAGNOSIS — Z780 Asymptomatic menopausal state: Secondary | ICD-10-CM | POA: Diagnosis not present

## 2016-11-10 DIAGNOSIS — Z13820 Encounter for screening for osteoporosis: Secondary | ICD-10-CM | POA: Diagnosis not present

## 2016-11-10 LAB — HM DEXA SCAN: HM Dexa Scan: NORMAL

## 2016-11-12 ENCOUNTER — Encounter: Payer: Self-pay | Admitting: Internal Medicine

## 2016-11-12 DIAGNOSIS — M79672 Pain in left foot: Secondary | ICD-10-CM | POA: Diagnosis not present

## 2016-11-12 DIAGNOSIS — G8929 Other chronic pain: Secondary | ICD-10-CM | POA: Diagnosis not present

## 2016-11-12 DIAGNOSIS — M79671 Pain in right foot: Secondary | ICD-10-CM | POA: Diagnosis not present

## 2016-11-18 DIAGNOSIS — R920 Mammographic microcalcification found on diagnostic imaging of breast: Secondary | ICD-10-CM | POA: Diagnosis not present

## 2016-11-18 DIAGNOSIS — R921 Mammographic calcification found on diagnostic imaging of breast: Secondary | ICD-10-CM | POA: Diagnosis not present

## 2016-11-18 DIAGNOSIS — R928 Other abnormal and inconclusive findings on diagnostic imaging of breast: Secondary | ICD-10-CM | POA: Diagnosis not present

## 2016-11-24 DIAGNOSIS — D242 Benign neoplasm of left breast: Secondary | ICD-10-CM | POA: Diagnosis not present

## 2016-11-24 DIAGNOSIS — R229 Localized swelling, mass and lump, unspecified: Secondary | ICD-10-CM | POA: Diagnosis not present

## 2016-11-24 DIAGNOSIS — R921 Mammographic calcification found on diagnostic imaging of breast: Secondary | ICD-10-CM | POA: Diagnosis not present

## 2016-12-01 ENCOUNTER — Other Ambulatory Visit: Payer: Self-pay | Admitting: Internal Medicine

## 2016-12-12 ENCOUNTER — Other Ambulatory Visit: Payer: Self-pay | Admitting: Internal Medicine

## 2016-12-14 ENCOUNTER — Other Ambulatory Visit: Payer: Self-pay | Admitting: Internal Medicine

## 2016-12-22 DIAGNOSIS — H40001 Preglaucoma, unspecified, right eye: Secondary | ICD-10-CM | POA: Diagnosis not present

## 2016-12-22 DIAGNOSIS — H40002 Preglaucoma, unspecified, left eye: Secondary | ICD-10-CM | POA: Diagnosis not present

## 2016-12-24 DIAGNOSIS — M5136 Other intervertebral disc degeneration, lumbar region: Secondary | ICD-10-CM | POA: Diagnosis not present

## 2016-12-24 DIAGNOSIS — M9903 Segmental and somatic dysfunction of lumbar region: Secondary | ICD-10-CM | POA: Diagnosis not present

## 2016-12-24 DIAGNOSIS — M5442 Lumbago with sciatica, left side: Secondary | ICD-10-CM | POA: Diagnosis not present

## 2016-12-24 DIAGNOSIS — M47816 Spondylosis without myelopathy or radiculopathy, lumbar region: Secondary | ICD-10-CM | POA: Diagnosis not present

## 2016-12-28 DIAGNOSIS — M5442 Lumbago with sciatica, left side: Secondary | ICD-10-CM | POA: Diagnosis not present

## 2016-12-28 DIAGNOSIS — M47816 Spondylosis without myelopathy or radiculopathy, lumbar region: Secondary | ICD-10-CM | POA: Diagnosis not present

## 2016-12-28 DIAGNOSIS — M5136 Other intervertebral disc degeneration, lumbar region: Secondary | ICD-10-CM | POA: Diagnosis not present

## 2016-12-28 DIAGNOSIS — M9903 Segmental and somatic dysfunction of lumbar region: Secondary | ICD-10-CM | POA: Diagnosis not present

## 2016-12-29 ENCOUNTER — Encounter: Payer: Self-pay | Admitting: Internal Medicine

## 2016-12-29 ENCOUNTER — Ambulatory Visit (INDEPENDENT_AMBULATORY_CARE_PROVIDER_SITE_OTHER): Payer: Medicare Other | Admitting: Internal Medicine

## 2016-12-29 VITALS — BP 124/82 | HR 67 | Temp 98.2°F | Resp 16 | Ht 69.0 in | Wt 180.0 lb

## 2016-12-29 DIAGNOSIS — Z0000 Encounter for general adult medical examination without abnormal findings: Secondary | ICD-10-CM | POA: Diagnosis not present

## 2016-12-29 DIAGNOSIS — I10 Essential (primary) hypertension: Secondary | ICD-10-CM | POA: Diagnosis not present

## 2016-12-29 DIAGNOSIS — G43009 Migraine without aura, not intractable, without status migrainosus: Secondary | ICD-10-CM

## 2016-12-29 DIAGNOSIS — R32 Unspecified urinary incontinence: Secondary | ICD-10-CM | POA: Diagnosis not present

## 2016-12-29 DIAGNOSIS — R7303 Prediabetes: Secondary | ICD-10-CM | POA: Insufficient documentation

## 2016-12-29 DIAGNOSIS — E162 Hypoglycemia, unspecified: Secondary | ICD-10-CM | POA: Diagnosis not present

## 2016-12-29 DIAGNOSIS — Z23 Encounter for immunization: Secondary | ICD-10-CM | POA: Diagnosis not present

## 2016-12-29 MED ORDER — POTASSIUM CHLORIDE CRYS ER 20 MEQ PO TBCR: 20.0000 meq | EXTENDED_RELEASE_TABLET | Freq: Every day | ORAL | 3 refills | Status: DC

## 2016-12-29 MED ORDER — SOLIFENACIN SUCCINATE 10 MG PO TABS: 5.0000 mg | ORAL_TABLET | Freq: Every day | ORAL | 3 refills | Status: DC

## 2016-12-29 MED ORDER — CHLORTHALIDONE 25 MG PO TABS: 25.0000 mg | ORAL_TABLET | Freq: Every day | ORAL | 3 refills | Status: DC

## 2016-12-29 MED ORDER — NITROFURANTOIN MACROCRYSTAL 50 MG PO CAPS: 50.0000 mg | ORAL_CAPSULE | Freq: Every day | ORAL | 5 refills | Status: DC | PRN

## 2016-12-29 MED ORDER — ESTRADIOL 0.1 MG/GM VA CREA: 1.0000 | TOPICAL_CREAM | Freq: Every day | VAGINAL | 12 refills | Status: AC

## 2016-12-29 MED ORDER — SUMATRIPTAN SUCCINATE 100 MG PO TABS: 100.0000 mg | ORAL_TABLET | ORAL | 5 refills | Status: DC | PRN

## 2016-12-29 NOTE — Assessment & Plan Note (Signed)
Migraines fairly controlled with current regimen Continue daily amitriptyline and topamax imitrex as needed

## 2016-12-29 NOTE — Progress Notes (Signed)
Pre visit review using our clinic review tool, if applicable. No additional management support is needed unless otherwise documented below in the visit note. 

## 2016-12-29 NOTE — Assessment & Plan Note (Signed)
Hypoglycemia/ hyperglycemia Check a1c

## 2016-12-29 NOTE — Patient Instructions (Signed)
  Deborah Hudson , Thank you for taking time to come for your Medicare Wellness Visit. I appreciate your ongoing commitment to your health goals. Please review the following plan we discussed and let me know if I can assist you in the future.   These are the goals we discussed: Goals    None      This is a list of the screening recommended for you and due dates:  Health Maintenance  Topic Date Due  . Colon Cancer Screening  05/08/1997  . Shingles Vaccine  05/09/2007  . Pneumonia vaccines (2 of 2 - PPSV23) 11/18/2016  . DEXA scan (bone density measurement)  11/10/2018  . Mammogram  11/18/2018  . Tetanus Vaccine  03/14/2019  . Flu Shot  Completed  .  Hepatitis C: One time screening is recommended by Center for Disease Control  (CDC) for  adults born from [birth year] through 1964.   Completed    Test(s) ordered today. Your results will be released to [place] (or called to you) after review, usually within 72hours after test completion. If any changes need to be made, you will be notified at that same time.  All other Health Maintenance issues reviewed.   All recommended immunizations and age-appropriate screenings are up-to-date or discussed.  Pneumovax immunization administered today.   Medications reviewed and updated.  No changes recommended at this time.  Your prescription(s) have been submitted to your pharmacy. Please take as directed and contact our office if you believe you are having problem(s) with the medication(s).   Please followup in one year

## 2016-12-29 NOTE — Assessment & Plan Note (Addendum)
BP well controlled Current regimen effective and well tolerated Continue current medications at current doses Cmp, tsh, lipids

## 2016-12-29 NOTE — Addendum Note (Signed)
Addended by: Terence Lux B on: 12/29/2016 12:57 PM   Modules accepted: Orders

## 2016-12-29 NOTE — Progress Notes (Signed)
Subjective:    Patient ID: Deborah Hudson, female    DOB: 1946-11-07, 71 y.o.   MRN: [id number]  HPI Here for medicare wellness exam and follow up of her chronic medical problems.   I have personally reviewed and have noted 1.The patient's medical and social history 2.Their use of alcohol, tobacco or illicit drugs 3.Their current medications and supplements 4.The patient's functional ability including ADL's, fall risks, home safety risks and hearing or visual impairment. 5.Diet and physical activities 6.Evidence for depression or mood disorders 7.Care team reviewed  - gyn - in [place], eye - Dr Isaiah Blakes at [facility]  Dr Cristina Gong - colonoscopy age 71   Hypertension: She is taking her medication daily. She is compliant with a low sodium diet.  She denies chest pain, palpitations, edema, shortness of breath and regular headaches. She is exercising regularly.      Migraine headaches:  She is taking topamax and amitriptyline daily.  These medications do help.  She takes imitrex as needed - on average 2 a month.   Urinary incontinence:  She is taking vesicare daily and feels is controlling her incontinence.   URI:  Her symptoms started two days ago.  She has sneezing, rhinorrhea, congestion and cough.  She is taking antihistamines.    Are there smokers in your home (other than you)? No  Risk Factors Exercise: treadmill - 40 minutes Dietary issues discussed: well balanced, does not eat many sugars  Cardiac risk factors: advanced age, hypertension, ? High cholesterol  Depression Screen  Have you felt down, depressed or hopeless? No  Have you felt little interest or pleasure in doing things?  No  Activities of Daily Living In your present state of health, do you have any difficulty performing the following activities?:  Driving? No Managing money?  No Feeding yourself? No Getting from bed to chair? No Climbing a flight of  stairs? No Preparing food and eating?: No Bathing or showering? No Getting dressed: No Getting to/using the toilet? No Moving around from place to place: No In the past year have you fallen or had a near fall?: No   Are you sexually active?  yes  Do you have more than one partner?  No   Hearing Difficulties: yes with back round noise Do you often ask people to speak up or repeat themselves? No Do you experience ringing or noises in your ears? No Do you have difficulty understanding soft or whispered voices? yes  Vision:              Any change in vision: no             Up to date with eye exam: yes  Memory:  Do you feel that you have a problem with memory? No  Do you often misplace items? No  Do you feel safe at home?  Yes  Cognitive Testing  Alert, Orientated? Yes  Normal Appearance? Yes  Recall of three objects?  Yes  Can perform simple calculations? Yes  Displays appropriate judgment? Yes  Can read the correct time from a watch face? Yes   Advanced Directives have been discussed with the patient? Yes - in place   Medications and allergies reviewed with patient and updated if appropriate.  Patient Active Problem List   Diagnosis Date Noted  . Gallstones 11/18/2015  . Essential hypertension, benign 11/18/2015  . Hypoglycemia 11/18/2015  . Urinary incontinence 11/18/2015  . Atrophy, Fuchs' 03/28/2014  . Osteoarthritis of hip 02/17/2012  .  Frequent UTI 10/05/2011  . Arthritis   . Migraine headache     No current outpatient prescriptions on file prior to visit.   No current facility-administered medications on file prior to visit.     Past Medical History:  Diagnosis Date  . Arthritis    hips  . Bladder infection    frequent /with Macrodantin usage  PRN  . Hyperlipidemia    borderline  . Hypoglycemia    from DECREASED PROTEIN INTAKE  . Migraine headache    migraines  . Peripheral vascular disease ([place])    varicose veins/ with injections    Past  Surgical History:  Procedure Laterality Date  . BREAST SURGERY     bilateral breast lumpectomy  . COLONOSCOPY    . TOE SURGERY     right great toe scrapping for arthritis  . TOTAL HIP ARTHROPLASTY  02/17/2012   Procedure: TOTAL HIP ARTHROPLASTY;  Surgeon: Gearlean Alf, MD;  Location: [facility];  Service: Orthopedics;  Laterality: Right;    Social History   Social History  . Marital status: Married    Spouse name: N/A  . Number of children: N/A  . Years of education: N/A   Social History Main Topics  . Smoking status: Never Smoker  . Smokeless tobacco: Never Used  . Alcohol use 1.2 oz/week    2 Glasses of wine per week  . Drug use: No  . Sexual activity: Yes   Other Topics Concern  . None   Social History Narrative  . None    Family History  Problem Relation Age of Onset  . Cancer Mother     lung  . Alzheimer's disease Mother   . Depression Mother   . Stroke Father   . Cancer Maternal Grandfather     lung  . Stroke Maternal Grandmother     Review of Systems  Constitutional: Negative for chills and fever.  HENT: Positive for congestion, hearing loss and sinus pressure. Negative for ear pain, sinus pain and sore throat.   Eyes: Negative for visual disturbance.  Respiratory: Positive for cough. Negative for shortness of breath and wheezing.   Cardiovascular: Negative for chest pain, palpitations and leg swelling.  Gastrointestinal: Negative for abdominal pain, blood in stool, constipation and diarrhea.       No gerd  Genitourinary: Negative for dysuria and hematuria.  Musculoskeletal: Positive for back pain (seeing a chiropractor).  Neurological: Positive for headaches (migraines). Negative for light-headedness.  Psychiatric/Behavioral: Negative for dysphoric mood. The patient is not nervous/anxious.        Objective:   Vitals:   12/29/16 1038  BP: 124/82  Pulse: 67  Resp: 16  Temp: 98.2 F (36.8 C)   Filed Weights   12/29/16 1038  Weight: 180 lb  (81.6 kg)   Body mass index is 26.58 kg/m.  Wt Readings from Last 3 Encounters:  12/29/16 180 lb (81.6 kg)  11/18/15 177 lb (80.3 kg)  02/17/12 169 lb 12.1 oz (77 kg)     Physical Exam Constitutional: She appears well-developed and well-nourished. No distress.  HENT:  Head: Normocephalic and atraumatic.  Right Ear: External ear normal. Normal ear canal and TM Left Ear: External ear normal.  Normal ear canal and TM Mouth/Throat: Oropharynx is clear and moist.  Eyes: Conjunctivae and EOM are normal.  Neck: Neck supple. No tracheal deviation present. No thyromegaly present.  No carotid bruit  Cardiovascular: Normal rate, regular rhythm and normal heart sounds.   No murmur heard.  No edema. Pulmonary/Chest: Effort normal and breath sounds normal. No respiratory distress. She has no wheezes. She has no rales.  Breast: deferred to Gyn Abdominal: Soft. She exhibits no distension. There is no tenderness.  Lymphadenopathy: She has no cervical adenopathy.  Skin: Skin is warm and dry. She is not diaphoretic.  Psychiatric: She has a normal mood and affect. Her behavior is normal.         Assessment & Plan:   Wellness Exam: Immunizations pneumovax today, discussed new shingles vaccine - has had the old one Colonoscopy - done at age 60 - will see if she can get the info Mammogram  Up to date  Dexa   Up to date  Gyn Up to date  Eye exam  Up to date  Hearing loss -yes when there is background noise - will have ears checked at [employer] concerns/difficulties - none Independent of ADLs - fully Stressed the importance of regular exercise   Patient received copy of preventative screening tests/immunizations recommended for the next 5-10 years.   See Problem List for Assessment and Plan of chronic medical problems.   FU annually

## 2016-12-29 NOTE — Assessment & Plan Note (Signed)
Lab Results  Component Value Date   HGBA1C 5.7 11/20/2015   Check a1c

## 2016-12-29 NOTE — Assessment & Plan Note (Signed)
Controlled, stable Continue current dose of medication - vesicare daily

## 2016-12-31 DIAGNOSIS — M5442 Lumbago with sciatica, left side: Secondary | ICD-10-CM | POA: Diagnosis not present

## 2016-12-31 DIAGNOSIS — M5136 Other intervertebral disc degeneration, lumbar region: Secondary | ICD-10-CM | POA: Diagnosis not present

## 2016-12-31 DIAGNOSIS — M9903 Segmental and somatic dysfunction of lumbar region: Secondary | ICD-10-CM | POA: Diagnosis not present

## 2016-12-31 DIAGNOSIS — M47816 Spondylosis without myelopathy or radiculopathy, lumbar region: Secondary | ICD-10-CM | POA: Diagnosis not present

## 2017-01-05 DIAGNOSIS — M9903 Segmental and somatic dysfunction of lumbar region: Secondary | ICD-10-CM | POA: Diagnosis not present

## 2017-01-05 DIAGNOSIS — M47816 Spondylosis without myelopathy or radiculopathy, lumbar region: Secondary | ICD-10-CM | POA: Diagnosis not present

## 2017-01-05 DIAGNOSIS — M5442 Lumbago with sciatica, left side: Secondary | ICD-10-CM | POA: Diagnosis not present

## 2017-01-05 DIAGNOSIS — M5136 Other intervertebral disc degeneration, lumbar region: Secondary | ICD-10-CM | POA: Diagnosis not present

## 2017-01-06 DIAGNOSIS — M5136 Other intervertebral disc degeneration, lumbar region: Secondary | ICD-10-CM | POA: Diagnosis not present

## 2017-01-06 DIAGNOSIS — M5442 Lumbago with sciatica, left side: Secondary | ICD-10-CM | POA: Diagnosis not present

## 2017-01-06 DIAGNOSIS — M9903 Segmental and somatic dysfunction of lumbar region: Secondary | ICD-10-CM | POA: Diagnosis not present

## 2017-01-06 DIAGNOSIS — M47816 Spondylosis without myelopathy or radiculopathy, lumbar region: Secondary | ICD-10-CM | POA: Diagnosis not present

## 2017-01-07 ENCOUNTER — Other Ambulatory Visit (INDEPENDENT_AMBULATORY_CARE_PROVIDER_SITE_OTHER): Payer: Medicare Other

## 2017-01-07 DIAGNOSIS — E162 Hypoglycemia, unspecified: Secondary | ICD-10-CM

## 2017-01-07 DIAGNOSIS — I10 Essential (primary) hypertension: Secondary | ICD-10-CM

## 2017-01-07 DIAGNOSIS — G43009 Migraine without aura, not intractable, without status migrainosus: Secondary | ICD-10-CM

## 2017-01-07 LAB — CBC WITH DIFFERENTIAL/PLATELET
Basophils Absolute: 0.1 10*3/uL (ref 0.0–0.1)
Basophils Relative: 0.7 % (ref 0.0–3.0)
Eosinophils Absolute: 0.2 10*3/uL (ref 0.0–0.7)
Eosinophils Relative: 2.3 % (ref 0.0–5.0)
HCT: 41.2 % (ref 36.0–46.0)
Hemoglobin: 14.2 g/dL (ref 12.0–15.0)
Lymphocytes Relative: 43.2 % (ref 12.0–46.0)
Lymphs Abs: 3.6 10*3/uL (ref 0.7–4.0)
MCHC: 34.5 g/dL (ref 30.0–36.0)
MCV: 91.1 fl (ref 78.0–100.0)
Monocytes Absolute: 1 10*3/uL (ref 0.1–1.0)
Monocytes Relative: 11.9 % (ref 3.0–12.0)
Neutro Abs: 3.5 10*3/uL (ref 1.4–7.7)
Neutrophils Relative %: 41.9 % — ABNORMAL LOW (ref 43.0–77.0)
Platelets: 346 10*3/uL (ref 150.0–400.0)
RBC: 4.53 Mil/uL (ref 3.87–5.11)
RDW: 12.6 % (ref 11.5–15.5)
WBC: 8.4 10*3/uL (ref 4.0–10.5)

## 2017-01-07 LAB — COMPREHENSIVE METABOLIC PANEL
ALT: 11 U/L (ref 0–35)
AST: 15 U/L (ref 0–37)
Albumin: 3.8 g/dL (ref 3.5–5.2)
Alkaline Phosphatase: 53 U/L (ref 39–117)
BUN: 11 mg/dL (ref 6–23)
CO2: 32 mEq/L (ref 19–32)
Calcium: 9 mg/dL (ref 8.4–10.5)
Chloride: 102 mEq/L (ref 96–112)
Creatinine, Ser: 0.92 mg/dL (ref 0.40–1.20)
GFR: 64.21 mL/min (ref >60.00)
Glucose, Bld: 89 mg/dL (ref 70–99)
Potassium: 3.4 mEq/L — ABNORMAL LOW (ref 3.5–5.1)
Sodium: 141 mEq/L (ref 135–145)
Total Bilirubin: 0.4 mg/dL (ref 0.2–1.2)
Total Protein: 6.6 g/dL (ref 6.0–8.3)

## 2017-01-07 LAB — LIPID PANEL
Cholesterol: 166 mg/dL (ref 0–200)
HDL: 41.9 mg/dL (ref >39.00)
LDL Cholesterol: 109 mg/dL — ABNORMAL HIGH (ref 0–99)
NonHDL: 124.39
Total CHOL/HDL Ratio: 4
Triglycerides: 79 mg/dL (ref 0.0–149.0)
VLDL: 15.8 mg/dL (ref 0.0–40.0)

## 2017-01-07 LAB — HEMOGLOBIN A1C: Hgb A1c MFr Bld: 5.6 % (ref 4.6–6.5)

## 2017-01-07 LAB — TSH: TSH: 1.74 u[IU]/mL (ref 0.35–4.50)

## 2017-01-11 ENCOUNTER — Other Ambulatory Visit: Payer: Self-pay | Admitting: Internal Medicine

## 2017-01-16 DIAGNOSIS — J018 Other acute sinusitis: Secondary | ICD-10-CM | POA: Diagnosis not present

## 2017-01-16 DIAGNOSIS — R05 Cough: Secondary | ICD-10-CM | POA: Diagnosis not present

## 2017-01-18 DIAGNOSIS — M5136 Other intervertebral disc degeneration, lumbar region: Secondary | ICD-10-CM | POA: Diagnosis not present

## 2017-01-18 DIAGNOSIS — M9903 Segmental and somatic dysfunction of lumbar region: Secondary | ICD-10-CM | POA: Diagnosis not present

## 2017-01-18 DIAGNOSIS — M47816 Spondylosis without myelopathy or radiculopathy, lumbar region: Secondary | ICD-10-CM | POA: Diagnosis not present

## 2017-01-18 DIAGNOSIS — M5431 Sciatica, right side: Secondary | ICD-10-CM | POA: Diagnosis not present

## 2017-01-18 DIAGNOSIS — M545 Low back pain: Secondary | ICD-10-CM | POA: Diagnosis not present

## 2017-01-18 DIAGNOSIS — M5442 Lumbago with sciatica, left side: Secondary | ICD-10-CM | POA: Diagnosis not present

## 2017-01-19 DIAGNOSIS — M9903 Segmental and somatic dysfunction of lumbar region: Secondary | ICD-10-CM | POA: Diagnosis not present

## 2017-01-19 DIAGNOSIS — M5136 Other intervertebral disc degeneration, lumbar region: Secondary | ICD-10-CM | POA: Diagnosis not present

## 2017-01-19 DIAGNOSIS — M5442 Lumbago with sciatica, left side: Secondary | ICD-10-CM | POA: Diagnosis not present

## 2017-01-19 DIAGNOSIS — M47816 Spondylosis without myelopathy or radiculopathy, lumbar region: Secondary | ICD-10-CM | POA: Diagnosis not present

## 2017-01-20 DIAGNOSIS — M5431 Sciatica, right side: Secondary | ICD-10-CM | POA: Diagnosis not present

## 2017-01-25 DIAGNOSIS — M5442 Lumbago with sciatica, left side: Secondary | ICD-10-CM | POA: Diagnosis not present

## 2017-01-25 DIAGNOSIS — M47816 Spondylosis without myelopathy or radiculopathy, lumbar region: Secondary | ICD-10-CM | POA: Diagnosis not present

## 2017-01-25 DIAGNOSIS — M5136 Other intervertebral disc degeneration, lumbar region: Secondary | ICD-10-CM | POA: Diagnosis not present

## 2017-01-25 DIAGNOSIS — M9903 Segmental and somatic dysfunction of lumbar region: Secondary | ICD-10-CM | POA: Diagnosis not present

## 2017-01-28 DIAGNOSIS — M47816 Spondylosis without myelopathy or radiculopathy, lumbar region: Secondary | ICD-10-CM | POA: Diagnosis not present

## 2017-01-28 DIAGNOSIS — M5136 Other intervertebral disc degeneration, lumbar region: Secondary | ICD-10-CM | POA: Diagnosis not present

## 2017-01-28 DIAGNOSIS — M9903 Segmental and somatic dysfunction of lumbar region: Secondary | ICD-10-CM | POA: Diagnosis not present

## 2017-01-28 DIAGNOSIS — M5442 Lumbago with sciatica, left side: Secondary | ICD-10-CM | POA: Diagnosis not present

## 2017-02-01 DIAGNOSIS — M47816 Spondylosis without myelopathy or radiculopathy, lumbar region: Secondary | ICD-10-CM | POA: Diagnosis not present

## 2017-02-01 DIAGNOSIS — M9903 Segmental and somatic dysfunction of lumbar region: Secondary | ICD-10-CM | POA: Diagnosis not present

## 2017-02-01 DIAGNOSIS — M5442 Lumbago with sciatica, left side: Secondary | ICD-10-CM | POA: Diagnosis not present

## 2017-02-01 DIAGNOSIS — M5136 Other intervertebral disc degeneration, lumbar region: Secondary | ICD-10-CM | POA: Diagnosis not present

## 2017-02-02 DIAGNOSIS — M47816 Spondylosis without myelopathy or radiculopathy, lumbar region: Secondary | ICD-10-CM | POA: Diagnosis not present

## 2017-02-02 DIAGNOSIS — M5136 Other intervertebral disc degeneration, lumbar region: Secondary | ICD-10-CM | POA: Diagnosis not present

## 2017-02-02 DIAGNOSIS — M9903 Segmental and somatic dysfunction of lumbar region: Secondary | ICD-10-CM | POA: Diagnosis not present

## 2017-02-02 DIAGNOSIS — M5442 Lumbago with sciatica, left side: Secondary | ICD-10-CM | POA: Diagnosis not present

## 2017-02-03 DIAGNOSIS — E784 Other hyperlipidemia: Secondary | ICD-10-CM | POA: Diagnosis not present

## 2017-02-03 DIAGNOSIS — Z882 Allergy status to sulfonamides status: Secondary | ICD-10-CM | POA: Diagnosis not present

## 2017-02-03 DIAGNOSIS — Z9889 Other specified postprocedural states: Secondary | ICD-10-CM | POA: Diagnosis not present

## 2017-02-03 DIAGNOSIS — D242 Benign neoplasm of left breast: Secondary | ICD-10-CM | POA: Diagnosis not present

## 2017-02-03 DIAGNOSIS — I10 Essential (primary) hypertension: Secondary | ICD-10-CM | POA: Diagnosis not present

## 2017-02-03 DIAGNOSIS — M5431 Sciatica, right side: Secondary | ICD-10-CM | POA: Diagnosis not present

## 2017-02-04 DIAGNOSIS — M5136 Other intervertebral disc degeneration, lumbar region: Secondary | ICD-10-CM | POA: Diagnosis not present

## 2017-02-04 DIAGNOSIS — M5442 Lumbago with sciatica, left side: Secondary | ICD-10-CM | POA: Diagnosis not present

## 2017-02-04 DIAGNOSIS — M9903 Segmental and somatic dysfunction of lumbar region: Secondary | ICD-10-CM | POA: Diagnosis not present

## 2017-02-04 DIAGNOSIS — M47816 Spondylosis without myelopathy or radiculopathy, lumbar region: Secondary | ICD-10-CM | POA: Diagnosis not present

## 2017-02-08 DIAGNOSIS — M5136 Other intervertebral disc degeneration, lumbar region: Secondary | ICD-10-CM | POA: Diagnosis not present

## 2017-02-08 DIAGNOSIS — M9903 Segmental and somatic dysfunction of lumbar region: Secondary | ICD-10-CM | POA: Diagnosis not present

## 2017-02-08 DIAGNOSIS — M47816 Spondylosis without myelopathy or radiculopathy, lumbar region: Secondary | ICD-10-CM | POA: Diagnosis not present

## 2017-02-08 DIAGNOSIS — M5442 Lumbago with sciatica, left side: Secondary | ICD-10-CM | POA: Diagnosis not present

## 2017-02-15 DIAGNOSIS — M47816 Spondylosis without myelopathy or radiculopathy, lumbar region: Secondary | ICD-10-CM | POA: Diagnosis not present

## 2017-02-15 DIAGNOSIS — M5442 Lumbago with sciatica, left side: Secondary | ICD-10-CM | POA: Diagnosis not present

## 2017-02-15 DIAGNOSIS — M5136 Other intervertebral disc degeneration, lumbar region: Secondary | ICD-10-CM | POA: Diagnosis not present

## 2017-02-15 DIAGNOSIS — M9903 Segmental and somatic dysfunction of lumbar region: Secondary | ICD-10-CM | POA: Diagnosis not present

## 2017-02-22 DIAGNOSIS — M5136 Other intervertebral disc degeneration, lumbar region: Secondary | ICD-10-CM | POA: Diagnosis not present

## 2017-02-22 DIAGNOSIS — M5442 Lumbago with sciatica, left side: Secondary | ICD-10-CM | POA: Diagnosis not present

## 2017-02-22 DIAGNOSIS — M9903 Segmental and somatic dysfunction of lumbar region: Secondary | ICD-10-CM | POA: Diagnosis not present

## 2017-02-22 DIAGNOSIS — M47816 Spondylosis without myelopathy or radiculopathy, lumbar region: Secondary | ICD-10-CM | POA: Diagnosis not present

## 2017-02-23 DIAGNOSIS — M545 Low back pain: Secondary | ICD-10-CM | POA: Diagnosis not present

## 2017-03-01 DIAGNOSIS — M5136 Other intervertebral disc degeneration, lumbar region: Secondary | ICD-10-CM | POA: Diagnosis not present

## 2017-03-01 DIAGNOSIS — M9903 Segmental and somatic dysfunction of lumbar region: Secondary | ICD-10-CM | POA: Diagnosis not present

## 2017-03-01 DIAGNOSIS — M5442 Lumbago with sciatica, left side: Secondary | ICD-10-CM | POA: Diagnosis not present

## 2017-03-01 DIAGNOSIS — M47816 Spondylosis without myelopathy or radiculopathy, lumbar region: Secondary | ICD-10-CM | POA: Diagnosis not present

## 2017-03-04 DIAGNOSIS — M47816 Spondylosis without myelopathy or radiculopathy, lumbar region: Secondary | ICD-10-CM | POA: Diagnosis not present

## 2017-03-04 DIAGNOSIS — M5442 Lumbago with sciatica, left side: Secondary | ICD-10-CM | POA: Diagnosis not present

## 2017-03-04 DIAGNOSIS — M5136 Other intervertebral disc degeneration, lumbar region: Secondary | ICD-10-CM | POA: Diagnosis not present

## 2017-03-04 DIAGNOSIS — M9903 Segmental and somatic dysfunction of lumbar region: Secondary | ICD-10-CM | POA: Diagnosis not present

## 2017-03-08 DIAGNOSIS — M47816 Spondylosis without myelopathy or radiculopathy, lumbar region: Secondary | ICD-10-CM | POA: Diagnosis not present

## 2017-03-08 DIAGNOSIS — M9903 Segmental and somatic dysfunction of lumbar region: Secondary | ICD-10-CM | POA: Diagnosis not present

## 2017-03-08 DIAGNOSIS — M5442 Lumbago with sciatica, left side: Secondary | ICD-10-CM | POA: Diagnosis not present

## 2017-03-08 DIAGNOSIS — M5136 Other intervertebral disc degeneration, lumbar region: Secondary | ICD-10-CM | POA: Diagnosis not present

## 2017-03-09 DIAGNOSIS — R300 Dysuria: Secondary | ICD-10-CM | POA: Diagnosis not present

## 2017-03-09 DIAGNOSIS — R319 Hematuria, unspecified: Secondary | ICD-10-CM | POA: Diagnosis not present

## 2017-03-09 DIAGNOSIS — N390 Urinary tract infection, site not specified: Secondary | ICD-10-CM | POA: Diagnosis not present

## 2017-03-10 DIAGNOSIS — M5431 Sciatica, right side: Secondary | ICD-10-CM | POA: Diagnosis not present

## 2017-03-10 DIAGNOSIS — M5136 Other intervertebral disc degeneration, lumbar region: Secondary | ICD-10-CM | POA: Diagnosis not present

## 2017-03-10 DIAGNOSIS — R300 Dysuria: Secondary | ICD-10-CM | POA: Diagnosis not present

## 2017-03-10 DIAGNOSIS — M545 Low back pain: Secondary | ICD-10-CM | POA: Diagnosis not present

## 2017-03-10 DIAGNOSIS — R309 Painful micturition, unspecified: Secondary | ICD-10-CM | POA: Diagnosis not present

## 2017-03-10 DIAGNOSIS — M48062 Spinal stenosis, lumbar region with neurogenic claudication: Secondary | ICD-10-CM | POA: Diagnosis not present

## 2017-03-16 DIAGNOSIS — M48062 Spinal stenosis, lumbar region with neurogenic claudication: Secondary | ICD-10-CM | POA: Diagnosis not present

## 2017-03-29 DIAGNOSIS — H40023 Open angle with borderline findings, high risk, bilateral: Secondary | ICD-10-CM | POA: Diagnosis not present

## 2017-03-29 DIAGNOSIS — H1851 Endothelial corneal dystrophy: Secondary | ICD-10-CM | POA: Diagnosis not present

## 2017-03-29 DIAGNOSIS — Z961 Presence of intraocular lens: Secondary | ICD-10-CM | POA: Diagnosis not present

## 2017-03-29 DIAGNOSIS — Z947 Corneal transplant status: Secondary | ICD-10-CM | POA: Diagnosis not present

## 2017-03-30 DIAGNOSIS — M48062 Spinal stenosis, lumbar region with neurogenic claudication: Secondary | ICD-10-CM | POA: Diagnosis not present

## 2017-03-30 DIAGNOSIS — M545 Low back pain: Secondary | ICD-10-CM | POA: Diagnosis not present

## 2017-03-30 DIAGNOSIS — M5136 Other intervertebral disc degeneration, lumbar region: Secondary | ICD-10-CM | POA: Diagnosis not present

## 2017-04-05 ENCOUNTER — Telehealth: Payer: Self-pay | Admitting: *Deleted

## 2017-04-05 DIAGNOSIS — G8929 Other chronic pain: Secondary | ICD-10-CM | POA: Diagnosis not present

## 2017-04-05 DIAGNOSIS — M545 Low back pain: Secondary | ICD-10-CM | POA: Diagnosis not present

## 2017-04-05 MED ORDER — POTASSIUM CHLORIDE CRYS ER 20 MEQ PO TBCR: 20.0000 meq | EXTENDED_RELEASE_TABLET | Freq: Every day | ORAL | 1 refills | Status: DC

## 2017-04-05 NOTE — Telephone Encounter (Signed)
Pt left msg on triage stating MD told her to increase her potassium to 2 pills a day. Ne[facility] [place] Verified chart on last labs MD advise to increase K. [facility]...lmb

## 2017-04-08 ENCOUNTER — Telehealth: Payer: Self-pay | Admitting: Internal Medicine

## 2017-04-08 ENCOUNTER — Other Ambulatory Visit (INDEPENDENT_AMBULATORY_CARE_PROVIDER_SITE_OTHER): Payer: Medicare Other

## 2017-04-08 DIAGNOSIS — R300 Dysuria: Secondary | ICD-10-CM

## 2017-04-08 DIAGNOSIS — N390 Urinary tract infection, site not specified: Secondary | ICD-10-CM | POA: Diagnosis not present

## 2017-04-08 DIAGNOSIS — G8929 Other chronic pain: Secondary | ICD-10-CM | POA: Diagnosis not present

## 2017-04-08 DIAGNOSIS — M545 Low back pain: Secondary | ICD-10-CM | POA: Diagnosis not present

## 2017-04-08 LAB — URINALYSIS, ROUTINE W REFLEX MICROSCOPIC
Bilirubin Urine: NEGATIVE
Ketones, ur: NEGATIVE
Nitrite: NEGATIVE
Specific Gravity, Urine: <=1.005 — AB (ref 1.000–1.030)
Total Protein, Urine: NEGATIVE
Urine Glucose: NEGATIVE
Urobilinogen, UA: 0.2 (ref 0.0–1.0)
pH: 6 (ref 5.0–8.0)

## 2017-04-08 MED ORDER — CIPROFLOXACIN HCL 250 MG PO TABS: 250.0000 mg | ORAL_TABLET | Freq: Two times a day (BID) | ORAL | 0 refills | Status: DC

## 2017-04-08 NOTE — Telephone Encounter (Signed)
Are you okay with this?

## 2017-04-08 NOTE — Telephone Encounter (Signed)
Patient has a UTI and would like to bring in the sample and get an abx. She states she has been taking otc medication for 5days and nothing worked yet. She already has the sample in a clean cup. States she going to bring it to the lab now. Patient did not want an appointment. Patient also wanted you to know she has an appointment with [facility] because she does get UTI's a lot. Please advise. Thank you.

## 2017-04-08 NOTE — Telephone Encounter (Signed)
ciprofloxacin (CIPRO) 250 MG tablet   [facility]   [facility]  [address], [place],  [address] [phone]  Patient states she thought we already had that on fill. Can we please update that. Her insurance changed her pharmacy.

## 2017-04-08 NOTE — Telephone Encounter (Signed)
Ok for urine sample  Referral ordered  abx sent to [facility]

## 2017-04-10 LAB — URINE CULTURE

## 2017-04-13 DIAGNOSIS — G8929 Other chronic pain: Secondary | ICD-10-CM | POA: Diagnosis not present

## 2017-04-13 DIAGNOSIS — M545 Low back pain: Secondary | ICD-10-CM | POA: Diagnosis not present

## 2017-04-15 DIAGNOSIS — M545 Low back pain: Secondary | ICD-10-CM | POA: Diagnosis not present

## 2017-04-15 DIAGNOSIS — G8929 Other chronic pain: Secondary | ICD-10-CM | POA: Diagnosis not present

## 2017-04-20 DIAGNOSIS — M545 Low back pain: Secondary | ICD-10-CM | POA: Diagnosis not present

## 2017-04-20 DIAGNOSIS — G8929 Other chronic pain: Secondary | ICD-10-CM | POA: Diagnosis not present

## 2017-05-03 DIAGNOSIS — M545 Low back pain: Secondary | ICD-10-CM | POA: Diagnosis not present

## 2017-05-03 DIAGNOSIS — G8929 Other chronic pain: Secondary | ICD-10-CM | POA: Diagnosis not present

## 2017-05-17 DIAGNOSIS — M545 Low back pain: Secondary | ICD-10-CM | POA: Diagnosis not present

## 2017-05-17 DIAGNOSIS — G8929 Other chronic pain: Secondary | ICD-10-CM | POA: Diagnosis not present

## 2017-05-18 ENCOUNTER — Other Ambulatory Visit: Payer: Medicare Other

## 2017-05-18 ENCOUNTER — Other Ambulatory Visit (INDEPENDENT_AMBULATORY_CARE_PROVIDER_SITE_OTHER): Payer: Medicare Other

## 2017-05-18 DIAGNOSIS — N390 Urinary tract infection, site not specified: Secondary | ICD-10-CM | POA: Diagnosis not present

## 2017-05-18 DIAGNOSIS — R300 Dysuria: Secondary | ICD-10-CM

## 2017-05-18 LAB — URINALYSIS, ROUTINE W REFLEX MICROSCOPIC
Bilirubin Urine: NEGATIVE
Ketones, ur: NEGATIVE
Nitrite: POSITIVE — AB
RBC / HPF: NONE SEEN (ref 0–?)
Specific Gravity, Urine: <=1.005 — AB (ref 1.000–1.030)
Total Protein, Urine: NEGATIVE
Urine Glucose: NEGATIVE
Urobilinogen, UA: 0.2 (ref 0.0–1.0)
pH: 6 (ref 5.0–8.0)

## 2017-05-18 NOTE — Addendum Note (Signed)
Addended by: Terence Lux B on: 05/18/2017 02:03 PM   Modules accepted: Orders

## 2017-05-18 NOTE — Telephone Encounter (Signed)
We will check sample and see if she needs to be treated.  No OFV needed

## 2017-05-18 NOTE — Telephone Encounter (Signed)
Please advise, she had a UTI last month and was not seen in the office.

## 2017-05-18 NOTE — Telephone Encounter (Signed)
By looking at chart it looks like pt had come into lab and given the sample. I informed pt that she needs to be seen for an OV but she is unable to due to taking care of her grand child. Please advise what should be done. She does have an appt with Urology next month.

## 2017-05-18 NOTE — Telephone Encounter (Signed)
LVM with pt to call back and schedule appt this afternoon.

## 2017-05-18 NOTE — Telephone Encounter (Signed)
She should come in to be seen - we can see her today

## 2017-05-18 NOTE — Telephone Encounter (Signed)
Pt would like to come into the lab and leave a urine sample, she has a UTI. Please advise. Can she do this?

## 2017-05-18 NOTE — Telephone Encounter (Signed)
Spoke with pt, she did want to make a follow-up appt for her BP next week.

## 2017-05-19 LAB — URINE CULTURE

## 2017-05-22 NOTE — Progress Notes (Signed)
Subjective:    Patient ID: Deborah Hudson, female    DOB: 09/12/1946, 71 y.o.   MRN: [id number]  HPI The patient is here for follow up.  Hypertension: She is taking her medication daily. She is compliant with a low sodium diet.  She denies chest pain, palpitations, edema, shortness of breath and regular headaches. She is not exercising regularly.  She does not monitor her blood pressure at home, but has a cuff at home.    Leg cramps:  She gets leg cramps during the night.  She does drink plenty of fluids during the day.  She wondered if it was the low potassium or chlorthalidone and wondered about switching medication.    Prediabetes:  She is compliant with a low sugar/carbohydrate diet.  She is not exercising regularly due to lower back pain.  She is thinking about doing weight watchers again to lose a few pounds.    She is following with orthopedics regarding her lower back pain and knee pain.   Medications and allergies reviewed with patient and updated if appropriate.  Patient Active Problem List   Diagnosis Date Noted  . Prediabetes 12/29/2016  . Gallstones 11/18/2015  . Essential hypertension, benign 11/18/2015  . Hypoglycemia 11/18/2015  . Urinary incontinence 11/18/2015  . Atrophy, Fuchs' 03/28/2014  . Osteoarthritis of hip 02/17/2012  . Frequent UTI 10/05/2011  . Arthritis   . Migraine headache     Current Outpatient Prescriptions on File Prior to Visit  Medication Sig Dispense Refill  . chlorthalidone (HYGROTON) 25 MG tablet Take 1 tablet (25 mg total) by mouth daily. 90 tablet 3  . estradiol (ESTRACE VAGINAL) 0.1 MG/GM vaginal cream Place 1 Applicatorful vaginally at bedtime. For two weeks then twice a week 42.5 g 12  . nitrofurantoin (MACRODANTIN) 50 MG capsule Take 1 capsule (50 mg total) by mouth daily as needed (after intercourse). 30 capsule 5  . potassium chloride SA (K-DUR,KLOR-CON) 20 MEQ tablet Take 1 tablet (20 mEq total) by mouth daily. 180 tablet 1  .  solifenacin (VESICARE) 10 MG tablet Take 0.5 tablets (5 mg total) by mouth daily. 45 tablet 3  . SUMAtriptan (IMITREX) 100 MG tablet Take 1 tablet (100 mg total) by mouth every 2 (two) hours as needed. For migraine 10 tablet 5   No current facility-administered medications on file prior to visit.     Past Medical History:  Diagnosis Date  . Arthritis    hips  . Bladder infection    frequent /with Macrodantin usage  PRN  . Hyperlipidemia    borderline  . Hypoglycemia    from DECREASED PROTEIN INTAKE  . Migraine headache    migraines  . Peripheral vascular disease ([place])    varicose veins/ with injections    Past Surgical History:  Procedure Laterality Date  . BREAST SURGERY     bilateral breast lumpectomy  . COLONOSCOPY    . TOE SURGERY     right great toe scrapping for arthritis  . TOTAL HIP ARTHROPLASTY  02/17/2012   Procedure: TOTAL HIP ARTHROPLASTY;  Surgeon: Gearlean Alf, MD;  Location: [facility];  Service: Orthopedics;  Laterality: Right;    Social History   Social History  . Marital status: Married    Spouse name: N/A  . Number of children: N/A  . Years of education: N/A   Social History Main Topics  . Smoking status: Never Smoker  . Smokeless tobacco: Never Used  . Alcohol use 1.2 oz/week  2 Glasses of wine per week  . Drug use: No  . Sexual activity: Yes   Other Topics Concern  . None   Social History Narrative  . None    Family History  Problem Relation Age of Onset  . Cancer Mother        lung  . Alzheimer's disease Mother   . Depression Mother   . Stroke Father   . Cancer Maternal Grandfather        lung  . Stroke Maternal Grandmother     Review of Systems  Constitutional: Negative for chills and fever.  Respiratory: Negative for cough, shortness of breath and wheezing.   Cardiovascular: Negative for chest pain, palpitations and leg swelling.  Musculoskeletal: Positive for myalgias (cramping at night).  Neurological: Negative for  light-headedness and numbness.       Objective:   Vitals:   05/24/17 0907  BP: 120/84  Pulse: 80  Resp: 16  Temp: 98.5 F (36.9 C)   Wt Readings from Last 3 Encounters:  05/24/17 184 lb (83.5 kg)  12/29/16 180 lb (81.6 kg)  11/18/15 177 lb (80.3 kg)   Body mass index is 27.17 kg/m.   Physical Exam    Constitutional: Appears well-developed and well-nourished. No distress.  HENT:  Head: Normocephalic and atraumatic.  Neck: Neck supple. No tracheal deviation present. No thyromegaly present.  No cervical lymphadenopathy Cardiovascular: Normal rate, regular rhythm and normal heart sounds.   No murmur heard. No carotid bruit .  No edema Pulmonary/Chest: Effort normal and breath sounds normal. No respiratory distress. No has no wheezes. No rales.  Skin: Skin is warm and dry. Not diaphoretic.  Psychiatric: Normal mood and affect. Behavior is normal.      Assessment & Plan:    See Problem List for Assessment and Plan of chronic medical problems.

## 2017-05-24 ENCOUNTER — Other Ambulatory Visit (INDEPENDENT_AMBULATORY_CARE_PROVIDER_SITE_OTHER): Payer: Medicare Other

## 2017-05-24 ENCOUNTER — Encounter: Payer: Self-pay | Admitting: Internal Medicine

## 2017-05-24 ENCOUNTER — Ambulatory Visit (INDEPENDENT_AMBULATORY_CARE_PROVIDER_SITE_OTHER): Payer: Medicare Other | Admitting: Internal Medicine

## 2017-05-24 VITALS — BP 120/84 | HR 80 | Temp 98.5°F | Resp 16 | Ht 69.0 in | Wt 184.0 lb

## 2017-05-24 DIAGNOSIS — I10 Essential (primary) hypertension: Secondary | ICD-10-CM | POA: Diagnosis not present

## 2017-05-24 DIAGNOSIS — R252 Cramp and spasm: Secondary | ICD-10-CM

## 2017-05-24 DIAGNOSIS — M545 Low back pain, unspecified: Secondary | ICD-10-CM | POA: Insufficient documentation

## 2017-05-24 DIAGNOSIS — M48062 Spinal stenosis, lumbar region with neurogenic claudication: Secondary | ICD-10-CM | POA: Diagnosis not present

## 2017-05-24 DIAGNOSIS — R7303 Prediabetes: Secondary | ICD-10-CM

## 2017-05-24 LAB — COMPREHENSIVE METABOLIC PANEL
ALT: 13 U/L (ref 0–35)
AST: 15 U/L (ref 0–37)
Albumin: 4.1 g/dL (ref 3.5–5.2)
Alkaline Phosphatase: 54 U/L (ref 39–117)
BUN: 14 mg/dL (ref 6–23)
CO2: 32 mEq/L (ref 19–32)
Calcium: 9.6 mg/dL (ref 8.4–10.5)
Chloride: 98 mEq/L (ref 96–112)
Creatinine, Ser: 0.92 mg/dL (ref 0.40–1.20)
GFR: 64.14 mL/min (ref >60.00)
Glucose, Bld: 99 mg/dL (ref 70–99)
Potassium: 3.3 mEq/L — ABNORMAL LOW (ref 3.5–5.1)
Sodium: 138 mEq/L (ref 135–145)
Total Bilirubin: 0.7 mg/dL (ref 0.2–1.2)
Total Protein: 6.7 g/dL (ref 6.0–8.3)

## 2017-05-24 LAB — HEMOGLOBIN A1C: Hgb A1c MFr Bld: 5.6 % (ref 4.6–6.5)

## 2017-05-24 LAB — MAGNESIUM: Magnesium: 2 mg/dL (ref 1.5–2.5)

## 2017-05-24 MED ORDER — MAGNESIUM 250 MG PO TABS: 250.0000 mg | ORAL_TABLET | Freq: Every day | ORAL | 0 refills | Status: DC

## 2017-05-24 MED ORDER — POTASSIUM CHLORIDE CRYS ER 20 MEQ PO TBCR: 40.0000 meq | EXTENDED_RELEASE_TABLET | Freq: Every day | ORAL | 1 refills | Status: DC

## 2017-05-24 NOTE — Patient Instructions (Signed)
  Test(s) ordered today. Your results will be released to MyChart (or called to you) after review, usually within 72hours after test completion. If any changes need to be made, you will be notified at that same time.  Medications reviewed and updated.  No changes recommended at this time.    Please followup in 6 months   

## 2017-05-24 NOTE — Assessment & Plan Note (Signed)
Lab Results  Component Value Date   HGBA1C 5.6 01/07/2017   Check a1c Low sugar / carb diet Stressed regular exercise, keeping weight down

## 2017-05-24 NOTE — Assessment & Plan Note (Signed)
Well controlled here - having leg cramps which may be related to the chlorthalidone / low potassium Check cmp, magnesium today Can either increase potassium if needed or change chlorthalidone to a different medication and stop the potassium - she will let me know She can monitor BP at home Continue magnesium supplementation and good hydration Consider trying Co-Q10

## 2017-05-26 ENCOUNTER — Other Ambulatory Visit: Payer: Self-pay | Admitting: Internal Medicine

## 2017-05-26 DIAGNOSIS — I10 Essential (primary) hypertension: Secondary | ICD-10-CM

## 2017-05-26 MED ORDER — VALSARTAN 160 MG PO TABS: 160.0000 mg | ORAL_TABLET | Freq: Every day | ORAL | 5 refills | Status: DC

## 2017-06-01 DIAGNOSIS — M545 Low back pain: Secondary | ICD-10-CM | POA: Diagnosis not present

## 2017-06-01 DIAGNOSIS — M461 Sacroiliitis, not elsewhere classified: Secondary | ICD-10-CM | POA: Diagnosis not present

## 2017-06-01 DIAGNOSIS — M62830 Muscle spasm of back: Secondary | ICD-10-CM | POA: Diagnosis not present

## 2017-06-04 ENCOUNTER — Other Ambulatory Visit (INDEPENDENT_AMBULATORY_CARE_PROVIDER_SITE_OTHER): Payer: Medicare Other

## 2017-06-04 DIAGNOSIS — I10 Essential (primary) hypertension: Secondary | ICD-10-CM

## 2017-06-04 DIAGNOSIS — M62830 Muscle spasm of back: Secondary | ICD-10-CM | POA: Diagnosis not present

## 2017-06-04 DIAGNOSIS — M2241 Chondromalacia patellae, right knee: Secondary | ICD-10-CM | POA: Diagnosis not present

## 2017-06-04 DIAGNOSIS — M545 Low back pain: Secondary | ICD-10-CM | POA: Diagnosis not present

## 2017-06-04 DIAGNOSIS — M25561 Pain in right knee: Secondary | ICD-10-CM | POA: Diagnosis not present

## 2017-06-04 DIAGNOSIS — M461 Sacroiliitis, not elsewhere classified: Secondary | ICD-10-CM | POA: Diagnosis not present

## 2017-06-04 LAB — BASIC METABOLIC PANEL
BUN: 11 mg/dL (ref 6–23)
CO2: 33 mEq/L — ABNORMAL HIGH (ref 19–32)
Calcium: 9.2 mg/dL (ref 8.4–10.5)
Chloride: 103 mEq/L (ref 96–112)
Creatinine, Ser: 0.87 mg/dL (ref 0.40–1.20)
GFR: 68.4 mL/min (ref >60.00)
Glucose, Bld: 87 mg/dL (ref 70–99)
Potassium: 3.8 mEq/L (ref 3.5–5.1)
Sodium: 142 mEq/L (ref 135–145)

## 2017-06-07 DIAGNOSIS — M62830 Muscle spasm of back: Secondary | ICD-10-CM | POA: Diagnosis not present

## 2017-06-07 DIAGNOSIS — M545 Low back pain: Secondary | ICD-10-CM | POA: Diagnosis not present

## 2017-06-07 DIAGNOSIS — M461 Sacroiliitis, not elsewhere classified: Secondary | ICD-10-CM | POA: Diagnosis not present

## 2017-06-09 DIAGNOSIS — N952 Postmenopausal atrophic vaginitis: Secondary | ICD-10-CM | POA: Diagnosis not present

## 2017-06-09 DIAGNOSIS — R159 Full incontinence of feces: Secondary | ICD-10-CM | POA: Diagnosis not present

## 2017-06-09 DIAGNOSIS — M62830 Muscle spasm of back: Secondary | ICD-10-CM | POA: Diagnosis not present

## 2017-06-09 DIAGNOSIS — M545 Low back pain: Secondary | ICD-10-CM | POA: Diagnosis not present

## 2017-06-09 DIAGNOSIS — N816 Rectocele: Secondary | ICD-10-CM | POA: Diagnosis not present

## 2017-06-09 DIAGNOSIS — N390 Urinary tract infection, site not specified: Secondary | ICD-10-CM | POA: Diagnosis not present

## 2017-06-09 DIAGNOSIS — M461 Sacroiliitis, not elsewhere classified: Secondary | ICD-10-CM | POA: Diagnosis not present

## 2017-06-09 DIAGNOSIS — N3946 Mixed incontinence: Secondary | ICD-10-CM | POA: Diagnosis not present

## 2017-06-10 ENCOUNTER — Telehealth: Payer: Self-pay | Admitting: Internal Medicine

## 2017-06-10 DIAGNOSIS — I10 Essential (primary) hypertension: Secondary | ICD-10-CM

## 2017-06-10 MED ORDER — AMLODIPINE BESYLATE 5 MG PO TABS: 5.0000 mg | ORAL_TABLET | Freq: Every day | ORAL | 1 refills | Status: DC

## 2017-06-10 NOTE — Telephone Encounter (Signed)
Spoke with pt to inform. [facility] rx sent to [facility]. Pt is aware to come in for blood work.

## 2017-06-10 NOTE — Telephone Encounter (Signed)
Please remove [facility] from Pts chart,  [facility] [place] [facility] [address]

## 2017-06-10 NOTE — Telephone Encounter (Signed)
yesterday at her urology appointment the pts BP was 148/95 She believes the Valsartan is not working states she has been on it for 10 days she takes it at night    She just took her BP at home and it was 133/90  She would like a call back in regard, please call cell

## 2017-06-10 NOTE — Telephone Encounter (Signed)
I think we should add amlodipine 5 mg daily and continue the valsartan 160 mg daily.   I would like her to have blood work done next week to recheck her kidney function and potassium.

## 2017-06-11 DIAGNOSIS — M461 Sacroiliitis, not elsewhere classified: Secondary | ICD-10-CM | POA: Diagnosis not present

## 2017-06-11 DIAGNOSIS — M62830 Muscle spasm of back: Secondary | ICD-10-CM | POA: Diagnosis not present

## 2017-06-11 DIAGNOSIS — M545 Low back pain: Secondary | ICD-10-CM | POA: Diagnosis not present

## 2017-06-11 MED ORDER — AMLODIPINE BESYLATE 5 MG PO TABS: 5.0000 mg | ORAL_TABLET | Freq: Every day | ORAL | 1 refills | Status: DC

## 2017-06-11 NOTE — Addendum Note (Signed)
Addended by: Terence Lux B on: 06/11/2017 07:59 AM   Modules accepted: Orders

## 2017-06-14 DIAGNOSIS — M62830 Muscle spasm of back: Secondary | ICD-10-CM | POA: Diagnosis not present

## 2017-06-14 DIAGNOSIS — M545 Low back pain: Secondary | ICD-10-CM | POA: Diagnosis not present

## 2017-06-14 DIAGNOSIS — L720 Epidermal cyst: Secondary | ICD-10-CM | POA: Diagnosis not present

## 2017-06-14 DIAGNOSIS — M461 Sacroiliitis, not elsewhere classified: Secondary | ICD-10-CM | POA: Diagnosis not present

## 2017-06-16 DIAGNOSIS — M461 Sacroiliitis, not elsewhere classified: Secondary | ICD-10-CM | POA: Diagnosis not present

## 2017-06-16 DIAGNOSIS — M62830 Muscle spasm of back: Secondary | ICD-10-CM | POA: Diagnosis not present

## 2017-06-16 DIAGNOSIS — M545 Low back pain: Secondary | ICD-10-CM | POA: Diagnosis not present

## 2017-06-18 ENCOUNTER — Other Ambulatory Visit (INDEPENDENT_AMBULATORY_CARE_PROVIDER_SITE_OTHER): Payer: Medicare Other

## 2017-06-18 DIAGNOSIS — M545 Low back pain: Secondary | ICD-10-CM | POA: Diagnosis not present

## 2017-06-18 DIAGNOSIS — M461 Sacroiliitis, not elsewhere classified: Secondary | ICD-10-CM | POA: Diagnosis not present

## 2017-06-18 DIAGNOSIS — M62830 Muscle spasm of back: Secondary | ICD-10-CM | POA: Diagnosis not present

## 2017-06-18 DIAGNOSIS — I10 Essential (primary) hypertension: Secondary | ICD-10-CM

## 2017-06-18 LAB — COMPREHENSIVE METABOLIC PANEL
ALT: 12 U/L (ref 0–35)
AST: 14 U/L (ref 0–37)
Albumin: 4.1 g/dL (ref 3.5–5.2)
Alkaline Phosphatase: 49 U/L (ref 39–117)
BUN: 10 mg/dL (ref 6–23)
CO2: 30 mEq/L (ref 19–32)
Calcium: 9.2 mg/dL (ref 8.4–10.5)
Chloride: 100 mEq/L (ref 96–112)
Creatinine, Ser: 0.83 mg/dL (ref 0.40–1.20)
GFR: 72.21 mL/min (ref >60.00)
Glucose, Bld: 94 mg/dL (ref 70–99)
Potassium: 3.7 mEq/L (ref 3.5–5.1)
Sodium: 137 mEq/L (ref 135–145)
Total Bilirubin: 0.9 mg/dL (ref 0.2–1.2)
Total Protein: 6.7 g/dL (ref 6.0–8.3)

## 2017-06-28 DIAGNOSIS — L720 Epidermal cyst: Secondary | ICD-10-CM | POA: Diagnosis not present

## 2017-07-05 DIAGNOSIS — M62830 Muscle spasm of back: Secondary | ICD-10-CM | POA: Diagnosis not present

## 2017-07-05 DIAGNOSIS — M545 Low back pain: Secondary | ICD-10-CM | POA: Diagnosis not present

## 2017-07-05 DIAGNOSIS — M461 Sacroiliitis, not elsewhere classified: Secondary | ICD-10-CM | POA: Diagnosis not present

## 2017-07-07 DIAGNOSIS — M62830 Muscle spasm of back: Secondary | ICD-10-CM | POA: Diagnosis not present

## 2017-07-07 DIAGNOSIS — M545 Low back pain: Secondary | ICD-10-CM | POA: Diagnosis not present

## 2017-07-07 DIAGNOSIS — M461 Sacroiliitis, not elsewhere classified: Secondary | ICD-10-CM | POA: Diagnosis not present

## 2017-07-09 DIAGNOSIS — M461 Sacroiliitis, not elsewhere classified: Secondary | ICD-10-CM | POA: Diagnosis not present

## 2017-07-09 DIAGNOSIS — M545 Low back pain: Secondary | ICD-10-CM | POA: Diagnosis not present

## 2017-07-09 DIAGNOSIS — M62830 Muscle spasm of back: Secondary | ICD-10-CM | POA: Diagnosis not present

## 2017-07-12 DIAGNOSIS — M62830 Muscle spasm of back: Secondary | ICD-10-CM | POA: Diagnosis not present

## 2017-07-12 DIAGNOSIS — M545 Low back pain: Secondary | ICD-10-CM | POA: Diagnosis not present

## 2017-07-12 DIAGNOSIS — M461 Sacroiliitis, not elsewhere classified: Secondary | ICD-10-CM | POA: Diagnosis not present

## 2017-07-14 DIAGNOSIS — M461 Sacroiliitis, not elsewhere classified: Secondary | ICD-10-CM | POA: Diagnosis not present

## 2017-07-14 DIAGNOSIS — M62830 Muscle spasm of back: Secondary | ICD-10-CM | POA: Diagnosis not present

## 2017-07-14 DIAGNOSIS — M545 Low back pain: Secondary | ICD-10-CM | POA: Diagnosis not present

## 2017-07-16 DIAGNOSIS — M545 Low back pain: Secondary | ICD-10-CM | POA: Diagnosis not present

## 2017-07-16 DIAGNOSIS — M62830 Muscle spasm of back: Secondary | ICD-10-CM | POA: Diagnosis not present

## 2017-07-16 DIAGNOSIS — M461 Sacroiliitis, not elsewhere classified: Secondary | ICD-10-CM | POA: Diagnosis not present

## 2017-07-19 DIAGNOSIS — M545 Low back pain: Secondary | ICD-10-CM | POA: Diagnosis not present

## 2017-07-19 DIAGNOSIS — M62830 Muscle spasm of back: Secondary | ICD-10-CM | POA: Diagnosis not present

## 2017-07-19 DIAGNOSIS — M461 Sacroiliitis, not elsewhere classified: Secondary | ICD-10-CM | POA: Diagnosis not present

## 2017-07-20 ENCOUNTER — Telehealth: Payer: Self-pay | Admitting: Internal Medicine

## 2017-07-20 MED ORDER — LOSARTAN POTASSIUM 50 MG PO TABS: 50.0000 mg | ORAL_TABLET | Freq: Every day | ORAL | 3 refills | Status: DC

## 2017-07-20 NOTE — Telephone Encounter (Signed)
Pt called stating her valsartan (DIOVAN) 160 MG tablet  Has been recalled  Please advise

## 2017-07-20 NOTE — Telephone Encounter (Signed)
Losartan sent to pharmacy - have her monitor BP

## 2017-07-21 DIAGNOSIS — M461 Sacroiliitis, not elsewhere classified: Secondary | ICD-10-CM | POA: Diagnosis not present

## 2017-07-21 DIAGNOSIS — M545 Low back pain: Secondary | ICD-10-CM | POA: Diagnosis not present

## 2017-07-21 DIAGNOSIS — M62830 Muscle spasm of back: Secondary | ICD-10-CM | POA: Diagnosis not present

## 2017-07-21 NOTE — Telephone Encounter (Signed)
Notified pt w/Md response.../lmb 

## 2017-07-23 DIAGNOSIS — M62830 Muscle spasm of back: Secondary | ICD-10-CM | POA: Diagnosis not present

## 2017-07-23 DIAGNOSIS — M545 Low back pain: Secondary | ICD-10-CM | POA: Diagnosis not present

## 2017-07-23 DIAGNOSIS — M461 Sacroiliitis, not elsewhere classified: Secondary | ICD-10-CM | POA: Diagnosis not present

## 2017-07-26 DIAGNOSIS — M62830 Muscle spasm of back: Secondary | ICD-10-CM | POA: Diagnosis not present

## 2017-07-26 DIAGNOSIS — M461 Sacroiliitis, not elsewhere classified: Secondary | ICD-10-CM | POA: Diagnosis not present

## 2017-07-26 DIAGNOSIS — M545 Low back pain: Secondary | ICD-10-CM | POA: Diagnosis not present

## 2017-07-29 ENCOUNTER — Telehealth: Payer: Self-pay | Admitting: Internal Medicine

## 2017-07-29 NOTE — Telephone Encounter (Signed)
losartan (COZAAR) 50 MG tablet   Patient states since she started taking the new BP medication she is holding extra fluid. She also is not sure if she still needed to be taking the amLODipine (NORVASC) 5 MG tablet with the losartan. Please follow up with patient. Thank you.   Patient is aware Dr.Burns is out of office.

## 2017-07-29 NOTE — Telephone Encounter (Signed)
Pt called back in, she would like for you to call her back when you get a chance

## 2017-07-29 NOTE — Telephone Encounter (Signed)
LVM for pt to call back and discuss. According to recent phone note, pt should be taking amlodipine and Losartan.

## 2017-07-30 ENCOUNTER — Other Ambulatory Visit: Payer: Self-pay | Admitting: Internal Medicine

## 2017-07-30 DIAGNOSIS — M461 Sacroiliitis, not elsewhere classified: Secondary | ICD-10-CM | POA: Diagnosis not present

## 2017-07-30 DIAGNOSIS — M62830 Muscle spasm of back: Secondary | ICD-10-CM | POA: Diagnosis not present

## 2017-07-30 DIAGNOSIS — M545 Low back pain: Secondary | ICD-10-CM | POA: Diagnosis not present

## 2017-07-30 NOTE — Telephone Encounter (Signed)
Pt is taking both the losartan and the amlodipine and is having increased swelling. Please advise if pt should come in for OV or if changes should be made.

## 2017-07-30 NOTE — Telephone Encounter (Signed)
LVM for pt to return call to schedule appt. Advised pt to continue med until she came in for appt.

## 2017-07-30 NOTE — Telephone Encounter (Signed)
Have her come in for a visit 

## 2017-08-03 ENCOUNTER — Ambulatory Visit (INDEPENDENT_AMBULATORY_CARE_PROVIDER_SITE_OTHER): Payer: Medicare Other | Admitting: Internal Medicine

## 2017-08-03 ENCOUNTER — Encounter: Payer: Self-pay | Admitting: Internal Medicine

## 2017-08-03 VITALS — BP 114/82 | HR 85 | Temp 98.5°F | Resp 16 | Wt 178.0 lb

## 2017-08-03 DIAGNOSIS — I10 Essential (primary) hypertension: Secondary | ICD-10-CM

## 2017-08-03 MED ORDER — LOSARTAN POTASSIUM 100 MG PO TABS: 100.0000 mg | ORAL_TABLET | Freq: Every day | ORAL | 1 refills | Status: DC

## 2017-08-03 MED ORDER — LOSARTAN POTASSIUM 50 MG PO TABS: 100.0000 mg | ORAL_TABLET | Freq: Every day | ORAL | 3 refills | Status: DC

## 2017-08-03 NOTE — Assessment & Plan Note (Signed)
BP controlled Side effects from amlodipine- swelling Will d/c amlodipine Increase losartan to 100 mg daily Monitor BP at home Call if not controlled

## 2017-08-03 NOTE — Patient Instructions (Addendum)
Coricidin is an good cold medication to take - it is safe for anyone with high blood pressure.    Medications reviewed and updated.  Changes include stopping the amlodipine.  Increase the losartan to 100 mg daily.   Monitor your BP at home.

## 2017-08-03 NOTE — Addendum Note (Signed)
Addended by: Terence Lux B on: 08/03/2017 03:13 PM   Modules accepted: Orders

## 2017-08-03 NOTE — Progress Notes (Signed)
Subjective:    Patient ID: Deborah Hudson, female    DOB: 09/01/1946, 71 y.o.   MRN: [id number]  HPI The patient is here for follow up.  Hypertension: She is taking her medication daily. She is compliant with a low sodium diet.  She has had swelling her hands She denies chest pain, palpitations, edema, shortness of breath and regular headaches. She is exercising regularly.  She does monitor her blood pressure at home.    UTI:  It started three days ago.  She has had dysuria and urinary incontinence.  She denies fever, cva pain or abdominal pain.  She takes keflex preventatively and just started taking for a UTI per GYN. Her symptoms have improved.   Medications and allergies reviewed with patient and updated if appropriate.  Patient Active Problem List   Diagnosis Date Noted  . Lower back pain 05/24/2017  . Prediabetes 12/29/2016  . Gallstones 11/18/2015  . Essential hypertension, benign 11/18/2015  . Hypoglycemia 11/18/2015  . Urinary incontinence 11/18/2015  . Atrophy, Fuchs' 03/28/2014  . Osteoarthritis of hip 02/17/2012  . Frequent UTI 10/05/2011  . Arthritis   . Migraine headache     Current Outpatient Prescriptions on File Prior to Visit  Medication Sig Dispense Refill  . amLODipine (NORVASC) 5 MG tablet Take 1 tablet (5 mg total) by mouth daily. 90 tablet 1  . estradiol (ESTRACE VAGINAL) 0.1 MG/GM vaginal cream Place 1 Applicatorful vaginally at bedtime. For two weeks then twice a week 42.5 g 12  . losartan (COZAAR) 50 MG tablet Take 1 tablet (50 mg total) by mouth daily. 90 tablet 3  . Magnesium 250 MG TABS Take 1 tablet (250 mg total) by mouth daily.  0  . SUMAtriptan (IMITREX) 100 MG tablet Take 1 tablet (100 mg total) by mouth every 2 (two) hours as needed. For migraine 10 tablet 5  . VESICARE 10 MG tablet TAKE 1 TABLET EVERY DAY 90 tablet 1   No current facility-administered medications on file prior to visit.     Past Medical History:  Diagnosis Date  .  Arthritis    hips  . Bladder infection    frequent /with Macrodantin usage  PRN  . Hyperlipidemia    borderline  . Hypoglycemia    from DECREASED PROTEIN INTAKE  . Migraine headache    migraines  . Peripheral vascular disease ([place])    varicose veins/ with injections    Past Surgical History:  Procedure Laterality Date  . BREAST SURGERY     bilateral breast lumpectomy  . COLONOSCOPY    . TOE SURGERY     right great toe scrapping for arthritis  . TOTAL HIP ARTHROPLASTY  02/17/2012   Procedure: TOTAL HIP ARTHROPLASTY;  Surgeon: Gearlean Alf, MD;  Location: [facility];  Service: Orthopedics;  Laterality: Right;    Social History   Social History  . Marital status: Married    Spouse name: N/A  . Number of children: N/A  . Years of education: N/A   Social History Main Topics  . Smoking status: Never Smoker  . Smokeless tobacco: Never Used  . Alcohol use 1.2 oz/week    2 Glasses of wine per week  . Drug use: No  . Sexual activity: Yes   Other Topics Concern  . Not on file   Social History Narrative  . No narrative on file    Family History  Problem Relation Age of Onset  . Cancer Mother  lung  . Alzheimer's disease Mother   . Depression Mother   . Stroke Father   . Cancer Maternal Grandfather        lung  . Stroke Maternal Grandmother     Review of Systems  Constitutional: Negative for fever.  Respiratory: Negative for shortness of breath.   Cardiovascular: Positive for leg swelling. Negative for chest pain and palpitations.  Neurological: Positive for headaches (occ migraines). Negative for dizziness and light-headedness.       Objective:   Vitals:   08/03/17 0835  BP: 114/82  Pulse: 85  Resp: 16  Temp: 98.5 F (36.9 C)  SpO2: 97%   Wt Readings from Last 3 Encounters:  08/03/17 178 lb (80.7 kg)  05/24/17 184 lb (83.5 kg)  12/29/16 180 lb (81.6 kg)   Body mass index is 26.29 kg/m.   Physical Exam    Constitutional: Appears  well-developed and well-nourished. No distress.  HENT:  Head: Normocephalic and atraumatic.  Neck: Neck supple. No tracheal deviation present. No thyromegaly present.  No cervical lymphadenopathy Cardiovascular: Normal rate, regular rhythm and normal heart sounds.   No murmur heard. No carotid bruit .  No edema Pulmonary/Chest: Effort normal and breath sounds normal. No respiratory distress. No has no wheezes. No rales.  Skin: Skin is warm and dry. Not diaphoretic.  Psychiatric: Normal mood and affect. Behavior is normal.      Assessment & Plan:    See Problem List for Assessment and Plan of chronic medical problems.

## 2017-08-05 DIAGNOSIS — M48062 Spinal stenosis, lumbar region with neurogenic claudication: Secondary | ICD-10-CM | POA: Diagnosis not present

## 2017-08-05 DIAGNOSIS — M5136 Other intervertebral disc degeneration, lumbar region: Secondary | ICD-10-CM | POA: Diagnosis not present

## 2017-08-09 DIAGNOSIS — M5136 Other intervertebral disc degeneration, lumbar region: Secondary | ICD-10-CM | POA: Diagnosis not present

## 2017-08-09 DIAGNOSIS — M48062 Spinal stenosis, lumbar region with neurogenic claudication: Secondary | ICD-10-CM | POA: Diagnosis not present

## 2017-08-13 ENCOUNTER — Other Ambulatory Visit: Payer: Self-pay | Admitting: Internal Medicine

## 2017-08-13 DIAGNOSIS — M5136 Other intervertebral disc degeneration, lumbar region: Secondary | ICD-10-CM | POA: Diagnosis not present

## 2017-08-13 DIAGNOSIS — M48062 Spinal stenosis, lumbar region with neurogenic claudication: Secondary | ICD-10-CM | POA: Diagnosis not present

## 2017-08-16 DIAGNOSIS — M5136 Other intervertebral disc degeneration, lumbar region: Secondary | ICD-10-CM | POA: Diagnosis not present

## 2017-08-16 DIAGNOSIS — M48062 Spinal stenosis, lumbar region with neurogenic claudication: Secondary | ICD-10-CM | POA: Diagnosis not present

## 2017-09-01 ENCOUNTER — Telehealth: Payer: Self-pay | Admitting: Internal Medicine

## 2017-09-01 DIAGNOSIS — M1612 Unilateral primary osteoarthritis, left hip: Secondary | ICD-10-CM | POA: Diagnosis not present

## 2017-09-01 DIAGNOSIS — I10 Essential (primary) hypertension: Secondary | ICD-10-CM

## 2017-09-01 DIAGNOSIS — M25552 Pain in left hip: Secondary | ICD-10-CM | POA: Diagnosis not present

## 2017-09-01 NOTE — Telephone Encounter (Signed)
Patient states on her last OV she asked to be referred to [name] in Cardiology.  Patient requesting referral to be entered.

## 2017-09-01 NOTE — Telephone Encounter (Signed)
ordered

## 2017-09-15 DIAGNOSIS — R152 Fecal urgency: Secondary | ICD-10-CM | POA: Diagnosis not present

## 2017-09-15 DIAGNOSIS — R159 Full incontinence of feces: Secondary | ICD-10-CM | POA: Diagnosis not present

## 2017-09-15 DIAGNOSIS — N393 Stress incontinence (female) (male): Secondary | ICD-10-CM | POA: Diagnosis not present

## 2017-09-15 DIAGNOSIS — N390 Urinary tract infection, site not specified: Secondary | ICD-10-CM | POA: Diagnosis not present

## 2017-09-15 DIAGNOSIS — M1612 Unilateral primary osteoarthritis, left hip: Secondary | ICD-10-CM | POA: Diagnosis not present

## 2017-09-16 ENCOUNTER — Telehealth: Payer: Self-pay | Admitting: Internal Medicine

## 2017-09-16 DIAGNOSIS — I10 Essential (primary) hypertension: Secondary | ICD-10-CM

## 2017-09-16 MED ORDER — TRIAMTERENE-HCTZ 37.5-25 MG PO TABS: 1.0000 | ORAL_TABLET | Freq: Every day | ORAL | 3 refills | Status: DC

## 2017-09-16 NOTE — Telephone Encounter (Signed)
maxide is ok but it is a strong water pill and can cause side effects more than other medications.  We can try to decrease the losartan to 50 mg and had hctz which is one of the medications in the maxide - this may help with the leg swelling.  We will need to recheck her kidney function and potassium about one week after changing medication

## 2017-09-16 NOTE — Telephone Encounter (Signed)
Ok.  Stop losartan.  Start maxzide 1 tab daily.  Check blood work one week after starting medication.  Monitor BP at home.

## 2017-09-16 NOTE — Telephone Encounter (Signed)
Pt called in and said that she does not want to take her bp meds that she is taking right now.  She said that she has a swollen foot and leg.  She wants to take [place].  Do you prescribe this?

## 2017-09-16 NOTE — Telephone Encounter (Signed)
Left patient vm to call back.  °

## 2017-09-16 NOTE — Telephone Encounter (Signed)
Patient has called back.  States she got the opinion of a very trusted family friend who is an MD and has suggested the maxide stating that it does not lower potassium.  Patient would like Dr. Quay Burow to reconsider putting her on the maxide.  Patient also states that she has never increased her dosage of losartan.  States she is still on losartan 50mg .  Please advise.

## 2017-09-16 NOTE — Telephone Encounter (Signed)
Called pt no answer LMOM RTC.,../lmb

## 2017-09-17 DIAGNOSIS — H1851 Endothelial corneal dystrophy: Secondary | ICD-10-CM | POA: Diagnosis not present

## 2017-09-17 NOTE — Telephone Encounter (Signed)
Patient called back. She has been informed of the notes. She understood.

## 2017-09-20 DIAGNOSIS — L918 Other hypertrophic disorders of the skin: Secondary | ICD-10-CM | POA: Diagnosis not present

## 2017-09-20 DIAGNOSIS — D171 Benign lipomatous neoplasm of skin and subcutaneous tissue of trunk: Secondary | ICD-10-CM | POA: Diagnosis not present

## 2017-09-20 DIAGNOSIS — D1801 Hemangioma of skin and subcutaneous tissue: Secondary | ICD-10-CM | POA: Diagnosis not present

## 2017-09-20 DIAGNOSIS — L812 Freckles: Secondary | ICD-10-CM | POA: Diagnosis not present

## 2017-09-20 DIAGNOSIS — L820 Inflamed seborrheic keratosis: Secondary | ICD-10-CM | POA: Diagnosis not present

## 2017-09-20 DIAGNOSIS — L821 Other seborrheic keratosis: Secondary | ICD-10-CM | POA: Diagnosis not present

## 2017-09-20 DIAGNOSIS — L723 Sebaceous cyst: Secondary | ICD-10-CM | POA: Diagnosis not present

## 2017-09-20 DIAGNOSIS — D2271 Melanocytic nevi of right lower limb, including hip: Secondary | ICD-10-CM | POA: Diagnosis not present

## 2017-09-27 DIAGNOSIS — N393 Stress incontinence (female) (male): Secondary | ICD-10-CM | POA: Diagnosis not present

## 2017-09-29 ENCOUNTER — Other Ambulatory Visit (INDEPENDENT_AMBULATORY_CARE_PROVIDER_SITE_OTHER): Payer: Medicare Other

## 2017-09-29 DIAGNOSIS — N390 Urinary tract infection, site not specified: Secondary | ICD-10-CM | POA: Diagnosis not present

## 2017-09-29 DIAGNOSIS — I10 Essential (primary) hypertension: Secondary | ICD-10-CM | POA: Diagnosis not present

## 2017-09-29 DIAGNOSIS — N393 Stress incontinence (female) (male): Secondary | ICD-10-CM | POA: Diagnosis not present

## 2017-09-29 LAB — BASIC METABOLIC PANEL
BUN: 15 mg/dL (ref 6–23)
CO2: 32 mEq/L (ref 19–32)
Calcium: 9.7 mg/dL (ref 8.4–10.5)
Chloride: 100 mEq/L (ref 96–112)
Creatinine, Ser: 0.78 mg/dL (ref 0.40–1.20)
GFR: 77.52 mL/min (ref >60.00)
Glucose, Bld: 104 mg/dL — ABNORMAL HIGH (ref 70–99)
Potassium: 3.9 mEq/L (ref 3.5–5.1)
Sodium: 139 mEq/L (ref 135–145)

## 2017-10-14 NOTE — Progress Notes (Signed)
Referring-Stacy Burns MD Reason for referral-Lower ext edema  HPI: 71 year old female for evaluation of lower extremity edema at request of Billey Gosling M.D. Patient denies dyspnea on exertion, orthopa, PND, chest pain, palpitations or syncope. Recently she had bilateral lower extremity edema left greater than right. She was placed on a diuretic and this has now improved. Cardiology now asked to evaluate.  Current Outpatient Medications  Medication Sig Dispense Refill  . cephALEXin (KEFLEX) 500 MG capsule Take 500 mg by mouth 4 (four) times daily. Take 1 capsule daily as needed( after intercourse)    . estradiol (ESTRACE VAGINAL) 0.1 MG/GM vaginal cream Place 1 Applicatorful vaginally at bedtime. For two weeks then twice a week 42.5 g 12  . Magnesium 250 MG TABS Take 1 tablet (250 mg total) by mouth daily.  0  . SUMAtriptan (IMITREX) 100 MG tablet Take 1 tablet (100 mg total) by mouth every 2 (two) hours as needed. For migraine 10 tablet 5  . triamterene-hydrochlorothiazide (MAXZIDE-25) 37.5-25 MG tablet Take 1 tablet by mouth daily. 30 tablet 3  . VESICARE 10 MG tablet TAKE 1 TABLET EVERY DAY 90 tablet 1   No current facility-administered medications for this visit.     Allergies  Allergen Reactions  . Sulfa Antibiotics Itching     Past Medical History:  Diagnosis Date  . Arthritis    hips  . Back pain   . Bladder infection    frequent /with Macrodantin usage  PRN  . Hyperlipidemia    borderline  . Hypertension   . Hypoglycemia    from DECREASED PROTEIN INTAKE  . Migraine headache    migraines  . Peripheral vascular disease ([place])    varicose veins/ with injections    Past Surgical History:  Procedure Laterality Date  . BREAST SURGERY     bilateral breast lumpectomy  . COLONOSCOPY    . TOE SURGERY     right great toe scrapping for arthritis  . TOTAL HIP ARTHROPLASTY  02/17/2012   Procedure: TOTAL HIP ARTHROPLASTY;  Surgeon: Gearlean Alf, MD;  Location: [facility];   Service: Orthopedics;  Laterality: Right;    Social History   Socioeconomic History  . Marital status: Married    Spouse name: Not on file  . Number of children: 2  . Years of education: Not on file  . Highest education level: Not on file  Social Needs  . Financial resource strain: Not on file  . Food insecurity - worry: Not on file  . Food insecurity - inability: Not on file  . Transportation needs - medical: Not on file  . Transportation needs - non-medical: Not on file  Occupational History  . Not on file  Tobacco Use  . Smoking status: Never Smoker  . Smokeless tobacco: Never Used  Substance and Sexual Activity  . Alcohol use: Yes    Alcohol/week: 1.2 oz    Types: 2 Glasses of wine per week    Comment: Rare  . Drug use: No  . Sexual activity: Yes  Other Topics Concern  . Not on file  Social History Narrative  . Not on file    Family History  Problem Relation Age of Onset  . Cancer Mother        lung  . Alzheimer's disease Mother   . Depression Mother   . Stroke Father   . Cancer Maternal Grandfather        lung  . Stroke Maternal Grandmother  ROS: Back pain but no fevers or chills, productive cough, hemoptysis, dysphasia, odynophagia, melena, hematochezia, dysuria, hematuria, rash, seizure activity, orthopnea, PND, pedal edema, claudication. Remaining systems are negative.  Physical Exam:   Blood pressure 116/76, pulse 76, height 5\' 9"  (1.753 m), weight 178 lb (80.7 kg).  General:  Well developed/well nourished in NAD Skin warm/dry Patient not depressed No peripheral clubbing Back-normal HEENT-normal/normal eyelids Neck supple/normal carotid upstroke bilaterally; no bruits; no JVD; no thyromegaly chest - CTA/ normal expansion CV - RRR/normal S1 and S2; no murmurs, rubs or gallops;  PMI nondisplaced Abdomen -NT/ND, no HSM, no mass, + bowel sounds, no bruit 2+ femoral pulses, no bruits Ext-no edema, chords, 2+ DP Neuro-grossly nonfocal  ECG -  normal sinus rhythm at a rate of 76. Nonspecific ST changes.personally reviewed  A/P  1 lower extremity edema-resolved with addition of diuretic. No edema on examination today. We will arrange an echocardiogram to assess LV systolic function and RV function. Continue present dose of diuretic.  2 hypertension-blood pressure is controlled.Continue present medications.  3 history of hyperlipidemia-management per primary care.  Kirk Ruths, MD

## 2017-10-26 ENCOUNTER — Encounter: Payer: Self-pay | Admitting: Cardiology

## 2017-10-26 ENCOUNTER — Encounter (INDEPENDENT_AMBULATORY_CARE_PROVIDER_SITE_OTHER): Payer: Self-pay

## 2017-10-26 ENCOUNTER — Ambulatory Visit (INDEPENDENT_AMBULATORY_CARE_PROVIDER_SITE_OTHER): Payer: Medicare Other | Admitting: Cardiology

## 2017-10-26 VITALS — BP 116/76 | HR 76 | Ht 69.0 in | Wt 178.0 lb

## 2017-10-26 DIAGNOSIS — R601 Generalized edema: Secondary | ICD-10-CM | POA: Diagnosis not present

## 2017-10-26 DIAGNOSIS — I10 Essential (primary) hypertension: Secondary | ICD-10-CM

## 2017-10-26 DIAGNOSIS — E7800 Pure hypercholesterolemia, unspecified: Secondary | ICD-10-CM

## 2017-10-26 NOTE — Patient Instructions (Signed)
Medication Instructions:   NO CHANGE  Testing/Procedures:  Your physician has requested that you have an echocardiogram. Echocardiography is a painless test that uses sound waves to create images of your heart. It provides your doctor with information about the size and shape of your heart and how well your heart's chambers and valves are working. This procedure takes approximately one hour. There are no restrictions for this procedure.    Follow-Up:  Your physician recommends that you schedule a follow-up appointment in: AS NEEDED PENDING TEST RESULTS      

## 2017-10-28 DIAGNOSIS — M25552 Pain in left hip: Secondary | ICD-10-CM | POA: Diagnosis not present

## 2017-11-16 DIAGNOSIS — Z1231 Encounter for screening mammogram for malignant neoplasm of breast: Secondary | ICD-10-CM | POA: Diagnosis not present

## 2017-11-16 LAB — HM MAMMOGRAPHY

## 2017-12-01 ENCOUNTER — Other Ambulatory Visit (HOSPITAL_COMMUNITY): Payer: Medicare Other

## 2017-12-03 ENCOUNTER — Other Ambulatory Visit: Payer: Self-pay | Admitting: Internal Medicine

## 2017-12-03 ENCOUNTER — Other Ambulatory Visit: Payer: Self-pay

## 2017-12-03 ENCOUNTER — Ambulatory Visit (HOSPITAL_COMMUNITY): Payer: Medicare Other | Attending: Cardiovascular Disease

## 2017-12-03 DIAGNOSIS — R600 Localized edema: Secondary | ICD-10-CM | POA: Diagnosis not present

## 2017-12-03 DIAGNOSIS — M7989 Other specified soft tissue disorders: Secondary | ICD-10-CM | POA: Diagnosis not present

## 2017-12-03 DIAGNOSIS — I10 Essential (primary) hypertension: Secondary | ICD-10-CM | POA: Insufficient documentation

## 2017-12-03 DIAGNOSIS — R601 Generalized edema: Secondary | ICD-10-CM

## 2017-12-03 DIAGNOSIS — E785 Hyperlipidemia, unspecified: Secondary | ICD-10-CM | POA: Diagnosis not present

## 2017-12-03 LAB — ECHOCARDIOGRAM COMPLETE
Ao-asc: 34 cm
E decel time: 254 msec
E/e' ratio: 10.81
FS: 32 % (ref 28–44)
IVS/LV PW RATIO, ED: 1.15
LA ID, A-P, ES: 34 mm
LA diam end sys: 34 mm
LA diam index: 1.73 cm/m2
LA vol A4C: 22 ml
LA vol index: 14.7 mL/m2
LA vol: 29 mL
LV E/e' medial: 10.81
LV E/e'average: 10.81
LV PW d: 10.2 mm — AB (ref 0.6–1.1)
LV dias vol index: 23 mL/m2
LV dias vol: 46 mL (ref 46–106)
LV e' LATERAL: 6.53 cm/s
LV sys vol index: 10 mL/m2
LV sys vol: 20 mL (ref 14–42)
LVOT SV: 57 mL
LVOT VTI: 18.3 cm
LVOT area: 3.14 cm2
LVOT diameter: 20 mm
LVOT peak grad rest: 3 mmHg
LVOT peak vel: 81.2 cm/s
MV Dec: 254
MV pk A vel: 83.9 m/s
MV pk E vel: 70.6 m/s
S' Lateral: 16.4 cm/s
Simpson's disk: 57
Stroke v: 26 ml
TDI e' lateral: 6.53
TDI e' medial: 6.53

## 2017-12-06 DIAGNOSIS — T83711A Erosion of implanted vaginal mesh and other prosthetic materials to surrounding organ or tissue, initial encounter: Secondary | ICD-10-CM | POA: Diagnosis not present

## 2017-12-06 DIAGNOSIS — Z882 Allergy status to sulfonamides status: Secondary | ICD-10-CM | POA: Diagnosis not present

## 2017-12-06 DIAGNOSIS — N393 Stress incontinence (female) (male): Secondary | ICD-10-CM | POA: Diagnosis not present

## 2017-12-06 DIAGNOSIS — G43019 Migraine without aura, intractable, without status migrainosus: Secondary | ICD-10-CM | POA: Diagnosis not present

## 2017-12-06 DIAGNOSIS — E785 Hyperlipidemia, unspecified: Secondary | ICD-10-CM | POA: Diagnosis not present

## 2017-12-06 DIAGNOSIS — I10 Essential (primary) hypertension: Secondary | ICD-10-CM | POA: Diagnosis not present

## 2017-12-06 DIAGNOSIS — Z792 Long term (current) use of antibiotics: Secondary | ICD-10-CM | POA: Diagnosis not present

## 2017-12-06 DIAGNOSIS — Z79899 Other long term (current) drug therapy: Secondary | ICD-10-CM | POA: Diagnosis not present

## 2017-12-06 DIAGNOSIS — Z79890 Hormone replacement therapy (postmenopausal): Secondary | ICD-10-CM | POA: Diagnosis not present

## 2017-12-16 DIAGNOSIS — M5136 Other intervertebral disc degeneration, lumbar region: Secondary | ICD-10-CM | POA: Diagnosis not present

## 2017-12-21 DIAGNOSIS — L720 Epidermal cyst: Secondary | ICD-10-CM | POA: Diagnosis not present

## 2017-12-21 DIAGNOSIS — L02213 Cutaneous abscess of chest wall: Secondary | ICD-10-CM | POA: Diagnosis not present

## 2017-12-30 DIAGNOSIS — H1851 Endothelial corneal dystrophy: Secondary | ICD-10-CM | POA: Diagnosis not present

## 2018-01-05 ENCOUNTER — Other Ambulatory Visit: Payer: Self-pay | Admitting: Internal Medicine

## 2018-01-06 ENCOUNTER — Encounter: Payer: Self-pay | Admitting: Internal Medicine

## 2018-01-10 DIAGNOSIS — H1851 Endothelial corneal dystrophy: Secondary | ICD-10-CM | POA: Diagnosis not present

## 2018-01-11 DIAGNOSIS — H2513 Age-related nuclear cataract, bilateral: Secondary | ICD-10-CM | POA: Diagnosis not present

## 2018-01-23 NOTE — Progress Notes (Signed)
Subjective:    Patient ID: Deborah Hudson, female    DOB: 05-Dec-1945, 72 y.o.   MRN: [id number]  HPI The patient is here for follow up.  Hypertension: She is taking her medication daily. She is compliant with a low sodium diet.  She denies chest pain, palpitations, shortness of breath and regular headaches. She is not exercising regularly due to chronic back pain.  She does not monitor her blood pressure at home.    Left foot swelling:  She has been having left foot swelling.  She was on Vesicare in the past and it caused the side effect.  She feels her current swelling may also be related to her current medication-oxybutynin.  Migraine headaches:  She has occasional migraines.  In the past 3 months she has had one migraine.  She did take Imitrex and it worked well.  She denies any side effects.  Frequent UTI:  She takes keflex as needed after intercourse.  She has not had any urinary tract infections in medication seems to be working well.  Prediabetes:  Her last a1c was in the normal range.  She is compliant with a low sugar/carbohydrate diet.  She is not exercising regularly.  Cold symptoms:  It started 6 weeks.  There is nasal congestion, postnasal drip and sinus pressure.  She denies any ear pain, sore throat, fevers or chills.  She has not had any persistent headaches or lightheadedness.  Medications and allergies reviewed with patient and updated if appropriate.  Patient Active Problem List   Diagnosis Date Noted  . Lower back pain 05/24/2017  . Prediabetes 12/29/2016  . Gallstones 11/18/2015  . Essential hypertension, benign 11/18/2015  . Hypoglycemia 11/18/2015  . Urinary incontinence 11/18/2015  . Atrophy, Fuchs' 03/28/2014  . Osteoarthritis of hip 02/17/2012  . Frequent UTI 10/05/2011  . Arthritis   . Migraine headache     Current Outpatient Medications on File Prior to Visit  Medication Sig Dispense Refill  . cephALEXin (KEFLEX) 500 MG capsule Take 500 mg by  mouth 4 (four) times daily. Take 1 capsule daily as needed( after intercourse)    . estradiol (ESTRACE VAGINAL) 0.1 MG/GM vaginal cream Place 1 Applicatorful vaginally at bedtime. For two weeks then twice a week 42.5 g 12  . Influenza vac split quadrivalent PF (FLUZONE HIGH-DOSE) 0.5 ML injection Fluzone High-Dose 2018-2019 (PF) 180 mcg/0.5 mL intramuscular syringe  TO BE ADMINISTERED BY PHARMACIST FOR IMMUNIZATION    . Magnesium 250 MG TABS Take 1 tablet (250 mg total) by mouth daily.  0  . SUMAtriptan (IMITREX) 100 MG tablet Take 1 tablet (100 mg total) by mouth every 2 (two) hours as needed. For migraine 10 tablet 5  . triamterene-hydrochlorothiazide (MAXZIDE-25) 37.5-25 MG tablet Take 1 tablet by mouth daily. -- Office visit needed for further refills 30 tablet 0  . VESICARE 10 MG tablet TAKE 1 TABLET EVERY DAY 90 tablet 1   No current facility-administered medications on file prior to visit.     Past Medical History:  Diagnosis Date  . Arthritis    hips  . Back pain   . Bladder infection    frequent /with Macrodantin usage  PRN  . Hyperlipidemia    borderline  . Hypertension   . Hypoglycemia    from DECREASED PROTEIN INTAKE  . Migraine headache    migraines  . Peripheral vascular disease ([place])    varicose veins/ with injections    Past Surgical History:  Procedure Laterality Date  .  BREAST SURGERY     bilateral breast lumpectomy  . COLONOSCOPY    . TOE SURGERY     right great toe scrapping for arthritis  . TOTAL HIP ARTHROPLASTY  02/17/2012   Procedure: TOTAL HIP ARTHROPLASTY;  Surgeon: Gearlean Alf, MD;  Location: [facility];  Service: Orthopedics;  Laterality: Right;    Social History   Socioeconomic History  . Marital status: Married    Spouse name: None  . Number of children: 2  . Years of education: None  . Highest education level: None  Social Needs  . Financial resource strain: None  . Food insecurity - worry: None  . Food insecurity - inability: None  .  Transportation needs - medical: None  . Transportation needs - non-medical: None  Occupational History  . None  Tobacco Use  . Smoking status: Never Smoker  . Smokeless tobacco: Never Used  Substance and Sexual Activity  . Alcohol use: Yes    Alcohol/week: 1.2 oz    Types: 2 Glasses of wine per week    Comment: Rare  . Drug use: No  . Sexual activity: Yes  Other Topics Concern  . None  Social History Narrative  . None    Family History  Problem Relation Age of Onset  . Cancer Mother        lung  . Alzheimer's disease Mother   . Depression Mother   . Stroke Father   . Cancer Maternal Grandfather        lung  . Stroke Maternal Grandmother     Review of Systems  Constitutional: Negative for chills and fever.  HENT: Positive for congestion, postnasal drip and sinus pressure. Negative for ear pain and sore throat.   Respiratory: Negative for cough, shortness of breath and wheezing.   Cardiovascular: Positive for leg swelling (left foot). Negative for chest pain and palpitations.  Neurological: Negative for light-headedness and headaches.       Objective:   Vitals:   01/24/18 1006  BP: 122/82  Pulse: 84  Resp: 16  Temp: 97.8 F (36.6 C)  SpO2: 96%   Wt Readings from Last 3 Encounters:  01/24/18 186 lb (84.4 kg)  10/26/17 178 lb (80.7 kg)  08/03/17 178 lb (80.7 kg)   Body mass index is 27.47 kg/m.   Physical Exam    Constitutional: Appears well-developed and well-nourished. No distress.  HENT:  Head: Normocephalic and atraumatic.  Neck: b/l ear canals and TMs normal, no oropharynx normal.  Neck supple. No tracheal deviation present. No thyromegaly present.  No cervical lymphadenopathy Cardiovascular: Normal rate, regular rhythm and normal heart sounds.   No murmur heard. No carotid bruit .  Trace left lower foot edema Pulmonary/Chest: Effort normal and breath sounds normal. No respiratory distress. No has no wheezes. No rales.  Skin: Skin is warm and dry.  Not diaphoretic.  Psychiatric: Normal mood and affect. Behavior is normal.      Assessment & Plan:    See Problem List for Assessment and Plan of chronic medical problems.

## 2018-01-23 NOTE — Patient Instructions (Addendum)
  Test(s) ordered today. Your results will be released to [facility] (or called to you) after review, usually within 72hours after test completion. If any changes need to be made, you will be notified at that same time.  All other Health Maintenance issues reviewed.   All recommended immunizations and age-appropriate screenings are up-to-date or discussed.  No immunizations administered today.   Medications reviewed and updated.  No changes recommended at this time.   Please followup in 1 year

## 2018-01-24 ENCOUNTER — Encounter: Payer: Self-pay | Admitting: Internal Medicine

## 2018-01-24 ENCOUNTER — Ambulatory Visit (INDEPENDENT_AMBULATORY_CARE_PROVIDER_SITE_OTHER): Payer: Medicare Other | Admitting: Internal Medicine

## 2018-01-24 ENCOUNTER — Other Ambulatory Visit (INDEPENDENT_AMBULATORY_CARE_PROVIDER_SITE_OTHER): Payer: Medicare Other

## 2018-01-24 VITALS — BP 122/82 | HR 84 | Temp 97.8°F | Resp 16 | Ht 69.0 in | Wt 186.0 lb

## 2018-01-24 DIAGNOSIS — M545 Low back pain, unspecified: Secondary | ICD-10-CM

## 2018-01-24 DIAGNOSIS — N390 Urinary tract infection, site not specified: Secondary | ICD-10-CM

## 2018-01-24 DIAGNOSIS — J320 Chronic maxillary sinusitis: Secondary | ICD-10-CM | POA: Diagnosis not present

## 2018-01-24 DIAGNOSIS — I10 Essential (primary) hypertension: Secondary | ICD-10-CM | POA: Diagnosis not present

## 2018-01-24 DIAGNOSIS — G43009 Migraine without aura, not intractable, without status migrainosus: Secondary | ICD-10-CM | POA: Diagnosis not present

## 2018-01-24 DIAGNOSIS — J329 Chronic sinusitis, unspecified: Secondary | ICD-10-CM | POA: Insufficient documentation

## 2018-01-24 DIAGNOSIS — R7303 Prediabetes: Secondary | ICD-10-CM

## 2018-01-24 LAB — COMPREHENSIVE METABOLIC PANEL
ALT: 12 U/L (ref 0–35)
AST: 17 U/L (ref 0–37)
Albumin: 4.2 g/dL (ref 3.5–5.2)
Alkaline Phosphatase: 62 U/L (ref 39–117)
BUN: 17 mg/dL (ref 6–23)
CO2: 30 mEq/L (ref 19–32)
Calcium: 10.1 mg/dL (ref 8.4–10.5)
Chloride: 102 mEq/L (ref 96–112)
Creatinine, Ser: 0.89 mg/dL (ref 0.40–1.20)
GFR: 66.51 mL/min (ref >60.00)
Glucose, Bld: 94 mg/dL (ref 70–99)
Potassium: 3.6 mEq/L (ref 3.5–5.1)
Sodium: 142 mEq/L (ref 135–145)
Total Bilirubin: 0.5 mg/dL (ref 0.2–1.2)
Total Protein: 7 g/dL (ref 6.0–8.3)

## 2018-01-24 LAB — HEMOGLOBIN A1C: Hgb A1c MFr Bld: 5.5 % (ref 4.6–6.5)

## 2018-01-24 MED ORDER — AMOXICILLIN-POT CLAVULANATE 875-125 MG PO TABS: 1.0000 | ORAL_TABLET | Freq: Two times a day (BID) | ORAL | 0 refills | Status: DC

## 2018-01-24 NOTE — Assessment & Plan Note (Addendum)
Chronic, affecting sleep Will see neurosurgery soon-we will also get a second opinion Will consider surgery since she has tried everything else and nothing has helped

## 2018-01-24 NOTE — Assessment & Plan Note (Signed)
Symptoms persistent for 6 weeks mild chronic sinus infection We will try Augmentin twice daily times 10 days Continue allergy medications and symptomatic treatment

## 2018-01-24 NOTE — Assessment & Plan Note (Signed)
Check a1c Low sugar / carb diet Stressed regular exercise   

## 2018-01-24 NOTE — Assessment & Plan Note (Signed)
Taking Keflex post intercourse only Not experiencing frequent urinary tract infections Following with gynecology Continue same

## 2018-01-24 NOTE — Assessment & Plan Note (Signed)
1 migraine in past 3 months continue imitrex as needed

## 2018-01-24 NOTE — Assessment & Plan Note (Signed)
BP well controlled Current regimen effective and well tolerated Continue current medications at current doses cmp  

## 2018-01-25 NOTE — Addendum Note (Signed)
Addended by: Terence Lux B on: 01/25/2018 07:42 AM   Modules accepted: Orders

## 2018-01-27 ENCOUNTER — Telehealth: Payer: Self-pay | Admitting: *Deleted

## 2018-01-27 NOTE — Telephone Encounter (Signed)
Patient requested medical records from from [facility] arrival records will be sent to [facility].

## 2018-02-02 DIAGNOSIS — I10 Essential (primary) hypertension: Secondary | ICD-10-CM | POA: Diagnosis not present

## 2018-02-02 DIAGNOSIS — M4316 Spondylolisthesis, lumbar region: Secondary | ICD-10-CM | POA: Diagnosis not present

## 2018-02-02 DIAGNOSIS — Z6827 Body mass index (BMI) 27.0-27.9, adult: Secondary | ICD-10-CM | POA: Diagnosis not present

## 2018-02-02 DIAGNOSIS — M5126 Other intervertebral disc displacement, lumbar region: Secondary | ICD-10-CM | POA: Diagnosis not present

## 2018-02-02 DIAGNOSIS — M48061 Spinal stenosis, lumbar region without neurogenic claudication: Secondary | ICD-10-CM | POA: Diagnosis not present

## 2018-02-03 ENCOUNTER — Other Ambulatory Visit: Payer: Self-pay | Admitting: Internal Medicine

## 2018-02-03 ENCOUNTER — Other Ambulatory Visit: Payer: Self-pay | Admitting: Neurological Surgery

## 2018-02-03 ENCOUNTER — Encounter: Payer: Self-pay | Admitting: Internal Medicine

## 2018-02-03 DIAGNOSIS — M48061 Spinal stenosis, lumbar region without neurogenic claudication: Secondary | ICD-10-CM

## 2018-02-04 ENCOUNTER — Ambulatory Visit
Admission: RE | Admit: 2018-02-04 | Discharge: 2018-02-04 | Disposition: A | Discharge status: Home / Self Care | Payer: Medicare Other | Source: Ambulatory Visit | Attending: Neurological Surgery | Admitting: Neurological Surgery

## 2018-02-04 DIAGNOSIS — M545 Low back pain: Secondary | ICD-10-CM | POA: Diagnosis not present

## 2018-02-04 DIAGNOSIS — M48061 Spinal stenosis, lumbar region without neurogenic claudication: Secondary | ICD-10-CM

## 2018-02-08 DIAGNOSIS — S23101A Dislocation of unspecified thoracic vertebra, initial encounter: Secondary | ICD-10-CM | POA: Diagnosis not present

## 2018-02-08 DIAGNOSIS — M9904 Segmental and somatic dysfunction of sacral region: Secondary | ICD-10-CM | POA: Diagnosis not present

## 2018-02-08 DIAGNOSIS — S3339XA Dislocation of other parts of lumbar spine and pelvis, initial encounter: Secondary | ICD-10-CM | POA: Diagnosis not present

## 2018-02-08 DIAGNOSIS — S33101A Dislocation of unspecified lumbar vertebra, initial encounter: Secondary | ICD-10-CM | POA: Diagnosis not present

## 2018-02-08 DIAGNOSIS — S332XXA Dislocation of sacroiliac and sacrococcygeal joint, initial encounter: Secondary | ICD-10-CM | POA: Diagnosis not present

## 2018-02-22 DIAGNOSIS — M48061 Spinal stenosis, lumbar region without neurogenic claudication: Secondary | ICD-10-CM | POA: Diagnosis not present

## 2018-03-14 NOTE — Progress Notes (Addendum)
Subjective:   Deborah Hudson is a 72 y.o. female who presents for Medicare Annual (Subsequent) preventive examination.  Review of Systems:  No ROS.  Medicare Wellness Visit. Additional risk factors are reflected in the social history.   Cardiac Risk Factors include: advanced age (>72men, >56 women);hypertension Sleep patterns: gets up 1-2 times nightly to void and sleeps 6-7 hours nightly.    Home Safety/Smoke Alarms: Feels safe in home. Smoke alarms in place.  Living environment; residence and Firearm Safety: 2-story house, no firearms., Lives with husband, no needs for DME, good support system Seat Belt Safety/Bike Helmet: Wears seat belt.     Objective:     Vitals: BP 136/76   Pulse 74   Resp 18   Ht 5\' 9"  (1.753 m)   Wt 183 lb (83 kg)   SpO2 99%   BMI 27.02 kg/m   Body mass index is 27.02 kg/m.  Advanced Directives 03/15/2018 02/17/2012 02/12/2012  Does Patient Have a Medical Advance Directive? Yes Patient has advance directive, copy in chart Patient has advance directive, copy in chart  Type of Advance Directive [place];Living will Living will;Healthcare Power of Attorney Living will;Healthcare Power of [place] in Chart? No - copy requested - -  Pre-existing out of facility DNR order (yellow form or pink MOST form) - No -    Tobacco Social History   Tobacco Use  Smoking Status Never Smoker  Smokeless Tobacco Never Used     Counseling given: Not Answered   Past Medical History:  Diagnosis Date  . Arthritis    hips  . Back pain   . Bladder infection    frequent /with Macrodantin usage  PRN  . Hyperlipidemia    borderline  . Hypertension   . Hypoglycemia    from DECREASED PROTEIN INTAKE  . Migraine headache    migraines  . Peripheral vascular disease ([place])    varicose veins/ with injections   Past Surgical History:  Procedure Laterality Date  . BREAST SURGERY     bilateral breast  lumpectomy  . COLONOSCOPY    . TOE SURGERY     right great toe scrapping for arthritis  . TOTAL HIP ARTHROPLASTY  02/17/2012   Procedure: TOTAL HIP ARTHROPLASTY;  Surgeon: Gearlean Alf, MD;  Location: [facility];  Service: Orthopedics;  Laterality: Right;   Family History  Problem Relation Age of Onset  . Cancer Mother        lung  . Alzheimer's disease Mother   . Depression Mother   . Stroke Father   . Cancer Maternal Grandfather        lung  . Stroke Maternal Grandmother    Social History   Socioeconomic History  . Marital status: Married    Spouse name: Not on file  . Number of children: 2  . Years of education: Not on file  . Highest education level: Not on file  Occupational History  . Not on file  Social Needs  . Financial resource strain: Not hard at all  . Food insecurity:    Worry: Never true    Inability: Never true  . Transportation needs:    Medical: No    Non-medical: No  Tobacco Use  . Smoking status: Never Smoker  . Smokeless tobacco: Never Used  Substance and Sexual Activity  . Alcohol use: Yes    Alcohol/week: 1.2 oz    Types: 2 Glasses of Yoshika Vensel per week  Comment: Rare  . Drug use: No  . Sexual activity: Yes  Lifestyle  . Physical activity:    Days per week: 3 days    Minutes per session: 30 min  . Stress: Not at all  Relationships  . Social connections:    Talks on phone: More than three times a week    Gets together: More than three times a week    Attends religious service: More than 4 times per year    Active member of club or organization: Yes    Attends meetings of clubs or organizations: More than 4 times per year    Relationship status: Married  Other Topics Concern  . Not on file  Social History Narrative  . Not on file    Outpatient Encounter Medications as of 03/15/2018  Medication Sig  . Aluminum & Magnesium Hydroxide (MAGNESIUM-ALUMINUM PO) Take 1 tablet by mouth.  . Cholecalciferol (VITAMIN D PO) Take 1 tablet by mouth.    . estradiol (ESTRACE VAGINAL) 0.1 MG/GM vaginal cream Place 1 Applicatorful vaginally at bedtime. For two weeks then twice a week  . POTASSIUM BICARBONATE PO Take 1 tablet by mouth.  . Probiotic Product (PROBIOTIC ADVANCED PO) Take by mouth.  . SUMAtriptan (IMITREX) 100 MG tablet Take 1 tablet (100 mg total) by mouth every 2 (two) hours as needed. For migraine  . triamterene-hydrochlorothiazide (MAXZIDE-25) 37.5-25 MG tablet Take 1 tablet by mouth daily.  . [DISCONTINUED] loteprednol (LOTEMAX) 0.5 % ophthalmic suspension Lotemax 0.5 % eye drops,suspension  PLACE 1 DROP INTO THE RIGHT EYE ONCE DAILY.  . [DISCONTINUED] amoxicillin-clavulanate (AUGMENTIN) 875-125 MG tablet Take 1 tablet by mouth 2 (two) times daily. (Patient not taking: Reported on 03/15/2018)  . [DISCONTINUED] cephALEXin (KEFLEX) 500 MG capsule Take 500 mg by mouth 4 (four) times daily. Take 1 capsule daily as needed( after intercourse)  . [DISCONTINUED] Influenza vac split quadrivalent PF (FLUZONE HIGH-DOSE) 0.5 ML injection Fluzone High-Dose 2018-2019 (PF) 180 mcg/0.5 mL intramuscular syringe  TO BE ADMINISTERED BY PHARMACIST FOR IMMUNIZATION  . [DISCONTINUED] oxybutynin (DITROPAN-XL) 10 MG 24 hr tablet oxybutynin chloride ER 10 mg tablet,extended release 24 hr   No facility-administered encounter medications on file as of 03/15/2018.     Activities of Daily Living In your present state of health, do you have any difficulty performing the following activities: 03/15/2018  Hearing? N  Vision? N  Difficulty concentrating or making decisions? N  Walking or climbing stairs? N  Dressing or bathing? N  Doing errands, shopping? N  Preparing Food and eating ? N  Using the Toilet? N  In the past six months, have you accidently leaked urine? Y  Comment Dr. Zigmund Daniel  Do you have problems with loss of bowel control? N  Managing your Medications? N  Managing your Finances? N  Housekeeping or managing your Housekeeping? N  Some  recent data might be hidden    Patient Care Team: Binnie Rail, MD as PCP - General (Internal Medicine)    Assessment:   This is a routine wellness examination for Deborah Hudson. Physical assessment deferred to PCP.   Exercise Activities and Dietary recommendations Current Exercise Habits: Structured exercise class, Type of exercise: walking, Time (Minutes): 40, Frequency (Times/Week): 3, Weekly Exercise (Minutes/Week): 120, Intensity: Mild Diet (meal preparation, eat out, water intake, caffeinated beverages, dairy products, fruits and vegetables): in general, a "healthy" diet  , well balanced eats a variety of fruits and vegetables daily, limits salt, fat/cholesterol, sugar,carbohydrates,caffeine, drinks 6-8 glasses of water daily.  Goals    . Patient Stated     Continue to walk, eat healthy, enjoy life, family and worship God.       Fall Risk Fall Risk  03/15/2018 05/24/2017  Falls in the past year? No No    Depression Screen PHQ 2/9 Scores 03/15/2018 05/24/2017  PHQ - 2 Score 1 0  PHQ- 9 Score 3 -     Cognitive Function MMSE - Mini Mental State Exam 03/15/2018  Orientation to time 5  Orientation to Place 5  Registration 3  Attention/ Calculation 5  Recall 2  Language- name 2 objects 2  Language- repeat 1  Language- follow 3 step command 3  Language- read & follow direction 1  Write a sentence 1  Copy design 1  Total score 29        Immunization History  Administered Date(s) Administered  . Influenza, High Dose Seasonal PF 08/06/2015  . Influenza-Unspecified 09/04/2014, 09/01/2015, 09/19/2016, 09/30/2017  . Pneumococcal Conjugate-13 11/19/2015  . Pneumococcal Polysaccharide-23 12/29/2016  . Tdap 03/13/2009    Screening Tests Health Maintenance  Topic Date Due  . INFLUENZA VACCINE  06/30/2018  . TETANUS/TDAP  03/14/2019  . MAMMOGRAM  11/17/2019  . COLONOSCOPY  06/10/2020  . DEXA SCAN  11/10/2021  . Hepatitis C Screening  Completed  . PNA vac Low Risk Adult   Completed      Plan:    Continue doing brain stimulating activities (puzzles, reading, adult coloring books, staying active) to keep memory sharp.   Continue to eat heart healthy diet (full of fruits, vegetables, whole grains, lean protein, water--limit salt, fat, and sugar intake) and increase physical activity as tolerated.  I have personally reviewed and noted the following in the patient's chart:   . Medical and social history . Use of alcohol, tobacco or illicit drugs  . Current medications and supplements . Functional ability and status . Nutritional status . Physical activity . Advanced directives . List of other physicians . Vitals . Screenings to include cognitive, depression, and falls . Referrals and appointments  In addition, I have reviewed and discussed with patient certain preventive protocols, quality metrics, and best practice recommendations. A written personalized care plan for preventive services as well as general preventive health recommendations were provided to patient.     Michiel Cowboy, RN  03/15/2018    Medical screening examination/treatment/procedure(s) were performed by non-physician practitioner and as supervising physician I was immediately available for consultation/collaboration. I agree with above. Binnie Rail, MD

## 2018-03-15 ENCOUNTER — Ambulatory Visit (INDEPENDENT_AMBULATORY_CARE_PROVIDER_SITE_OTHER): Payer: Medicare Other | Admitting: *Deleted

## 2018-03-15 VITALS — BP 136/76 | HR 74 | Resp 18 | Ht 69.0 in | Wt 183.0 lb

## 2018-03-15 DIAGNOSIS — Z0000 Encounter for general adult medical examination without abnormal findings: Secondary | ICD-10-CM | POA: Diagnosis not present

## 2018-03-15 NOTE — Patient Instructions (Addendum)
Doterra natural oils: Lavendar to help sleep.  Continue doing brain stimulating activities (puzzles, reading, adult coloring books, staying active) to keep memory sharp.   Continue to eat heart healthy diet (full of fruits, vegetables, whole grains, lean protein, water--limit salt, fat, and sugar intake) and increase physical activity as tolerated.  Deborah Hudson , Thank you for taking time to come for your Medicare Wellness Visit. I appreciate your ongoing commitment to your health goals. Please review the following plan we discussed and let me know if I can assist you in the future.   These are the goals we discussed: Goals    . Patient Stated     Continue to walk, eat healthy, enjoy life, family and worship God.       This is a list of the screening recommended for you and due dates:  Health Maintenance  Topic Date Due  . Flu Shot  06/30/2018  . Tetanus Vaccine  03/14/2019  . Mammogram  11/17/2019  . Colon Cancer Screening  06/10/2020  . DEXA scan (bone density measurement)  11/10/2021  .  Hepatitis C: One time screening is recommended by Center for Disease Control  (CDC) for  adults born from [birth year] through 1964.   Completed  . Pneumonia vaccines  Completed

## 2018-03-22 DIAGNOSIS — M5136 Other intervertebral disc degeneration, lumbar region: Secondary | ICD-10-CM | POA: Diagnosis not present

## 2018-03-23 DIAGNOSIS — H40023 Open angle with borderline findings, high risk, bilateral: Secondary | ICD-10-CM | POA: Diagnosis not present

## 2018-03-23 DIAGNOSIS — Z961 Presence of intraocular lens: Secondary | ICD-10-CM | POA: Diagnosis not present

## 2018-03-23 DIAGNOSIS — H1851 Endothelial corneal dystrophy: Secondary | ICD-10-CM | POA: Diagnosis not present

## 2018-03-31 ENCOUNTER — Other Ambulatory Visit: Payer: Self-pay | Admitting: Neurological Surgery

## 2018-04-07 DIAGNOSIS — B078 Other viral warts: Secondary | ICD-10-CM | POA: Diagnosis not present

## 2018-04-08 NOTE — Pre-Procedure Instructions (Signed)
Deborah Hudson  04/08/2018      [id number] - [place], [place] - [address] [place] [address] Phone: [phone] Fax: [phone]    Your procedure is scheduled on Mon. May 20  Report to [facility] Admitting at 10:40 A.M.  Call this number if you have problems the morning of surgery:  [phone]   Remember:  Do not eat food or drink liquids after midnight on Sun. May 19   Take these medicines the morning of surgery with A SIP OF WATER : imitrex if needed,eye drops              7 days prior to surgery STOP taking any Aspirin(unless otherwise instructed by your surgeon), Aleve, Naproxen, Ibuprofen, Motrin, Advil, Goody's, BC's, all herbal medications, fish oil, and all vitamins   Do not wear jewelry, make-up or nail polish.  Do not wear lotions, powders, or perfumes, or deodorant.  Do not shave 48 hours prior to surgery.  Men may shave face and neck.  Do not bring valuables to the hospital.  [facility] is not responsible for any belongings or valuables.  Contacts, dentures or bridgework may not be worn into surgery.  Leave your suitcase in the car.  After surgery it may be brought to your room.  For patients admitted to the hospital, discharge time will be determined by your treatment team.  Patients discharged the day of surgery will not be allowed to drive home.    Special instructions:   [facility]- Preparing For Surgery  Before surgery, you can play an important role. Because skin is not sterile, your skin needs to be as free of germs as possible. You can reduce the number of germs on your skin by washing with CHG (chlorahexidine gluconate) Soap before surgery.  CHG is an antiseptic cleaner which kills germs and bonds with the skin to continue killing germs even after washing.  Oral Hygiene is also important to reduce your risk of infection.  Remember - BRUSH YOUR TEETH THE MORNING  OF SURGERY  Please do not use if you have an allergy to CHG or antibacterial soaps. If your skin becomes reddened/irritated stop using the CHG.  Do not shave (including legs and underarms) for at least 48 hours prior to first CHG shower. It is OK to shave your face.  Please follow these instructions carefully.   1. Shower the NIGHT BEFORE SURGERY and the MORNING OF SURGERY with CHG.   2. If you chose to wash your hair, wash your hair first as usual with your normal shampoo.  3. After you shampoo, rinse your hair and body thoroughly to remove the shampoo.  4. Use CHG as you would any other liquid soap. You can apply CHG directly to the skin and wash gently with a scrungie or a clean washcloth.   5. Apply the CHG Soap to your body ONLY FROM THE NECK DOWN.  Do not use on open wounds or open sores. Avoid contact with your eyes, ears, mouth and genitals (private parts). Wash Face and genitals (private parts)  with your normal soap.  6. Wash thoroughly, paying special attention to the area where your surgery will be performed.  7. Thoroughly rinse your body with warm water from the neck down.  8. DO NOT shower/wash with your normal soap after using and rinsing off the CHG Soap.  9. Pat yourself dry with  a CLEAN TOWEL.  10. Wear CLEAN PAJAMAS to bed the night before surgery, wear comfortable clothes the morning of surgery  11. Place CLEAN SHEETS on your bed the night of your first shower and DO NOT SLEEP WITH PETS.    Day of Surgery: Do not apply any deodorants/lotions. Please wear clean clothes to the hospital/surgery center.  Remember to brush your teeth.      Please read over the following fact sheets that you were given. Coughing and Deep Breathing, MRSA Information and Surgical Site Infection Prevention

## 2018-04-11 ENCOUNTER — Encounter (HOSPITAL_COMMUNITY): Payer: Self-pay

## 2018-04-11 ENCOUNTER — Other Ambulatory Visit: Payer: Self-pay

## 2018-04-11 ENCOUNTER — Encounter (HOSPITAL_COMMUNITY)
Admission: RE | Admit: 2018-04-11 | Discharge: 2018-04-11 | Disposition: A | Discharge status: Home / Self Care | Payer: Medicare Other | Source: Ambulatory Visit | Attending: Neurological Surgery | Admitting: Neurological Surgery

## 2018-04-11 DIAGNOSIS — Z01818 Encounter for other preprocedural examination: Secondary | ICD-10-CM | POA: Diagnosis not present

## 2018-04-11 DIAGNOSIS — I10 Essential (primary) hypertension: Secondary | ICD-10-CM | POA: Insufficient documentation

## 2018-04-11 DIAGNOSIS — Z79899 Other long term (current) drug therapy: Secondary | ICD-10-CM | POA: Insufficient documentation

## 2018-04-11 DIAGNOSIS — M48061 Spinal stenosis, lumbar region without neurogenic claudication: Secondary | ICD-10-CM | POA: Diagnosis not present

## 2018-04-11 DIAGNOSIS — E785 Hyperlipidemia, unspecified: Secondary | ICD-10-CM | POA: Insufficient documentation

## 2018-04-11 DIAGNOSIS — Z96641 Presence of right artificial hip joint: Secondary | ICD-10-CM | POA: Diagnosis not present

## 2018-04-11 DIAGNOSIS — Z9841 Cataract extraction status, right eye: Secondary | ICD-10-CM | POA: Diagnosis not present

## 2018-04-11 DIAGNOSIS — M1610 Unilateral primary osteoarthritis, unspecified hip: Secondary | ICD-10-CM | POA: Insufficient documentation

## 2018-04-11 DIAGNOSIS — I739 Peripheral vascular disease, unspecified: Secondary | ICD-10-CM | POA: Diagnosis not present

## 2018-04-11 DIAGNOSIS — Z01812 Encounter for preprocedural laboratory examination: Secondary | ICD-10-CM | POA: Diagnosis not present

## 2018-04-11 LAB — BASIC METABOLIC PANEL
Anion gap: 7 (ref 5–15)
BUN: 11 mg/dL (ref 6–20)
CO2: 29 mmol/L (ref 22–32)
Calcium: 9.3 mg/dL (ref 8.9–10.3)
Chloride: 102 mmol/L (ref 101–111)
Creatinine, Ser: 0.88 mg/dL (ref 0.44–1.00)
GFR calc Af Amer: >60 mL/min (ref >60)
GFR calc non Af Amer: >60 mL/min (ref >60)
Glucose, Bld: 94 mg/dL (ref 65–99)
Potassium: 3.7 mmol/L (ref 3.5–5.1)
Sodium: 138 mmol/L (ref 135–145)

## 2018-04-11 LAB — CBC
HCT: 42.4 % (ref 36.0–46.0)
Hemoglobin: 14.7 g/dL (ref 12.0–15.0)
MCH: 32 pg (ref 26.0–34.0)
MCHC: 34.7 g/dL (ref 30.0–36.0)
MCV: 92.4 fL (ref 78.0–100.0)
Platelets: 301 10*3/uL (ref 150–400)
RBC: 4.59 MIL/uL (ref 3.87–5.11)
RDW: 13.2 % (ref 11.5–15.5)
WBC: 6 10*3/uL (ref 4.0–10.5)

## 2018-04-11 LAB — SURGICAL PCR SCREEN
MRSA, PCR: NEGATIVE
Staphylococcus aureus: NEGATIVE

## 2018-04-11 NOTE — Progress Notes (Signed)
Anesthesia Chart Review:   Case:  [id number] Date/Time:  04/18/18 1224   Procedure:  Bilateral laminotonies L3-4 (Bilateral Back)   Anesthesia type:  General   Pre-op diagnosis:  Stenosis   Location:  [facility] OR ROOM 21 / [place] [facility]   Surgeon:  Kristeen Miss, MD      DISCUSSION: Pt is a 72 year old female with hx HTN.    VS: BP 111/78   Pulse 69   Temp 36.7 C   Resp 20   Ht 5\' 9"  (1.753 m)   Wt 184 lb 1.6 oz (83.5 kg)   SpO2 96%   BMI 27.19 kg/m    PROVIDERS:  PCP is Binnie Rail, MD Saw cardiologist Kirk Ruths, MD 10/26/17 for lower extremity edema. Echo ordered, but it appears it wasn't done until 12/03/17 (ordered by PCP). Pt does not routinely follow up with cardiology.    LABS: Labs reviewed: Acceptable for surgery. (all labs ordered are listed, but only abnormal results are displayed)  Labs Reviewed  SURGICAL PCR SCREEN  BASIC METABOLIC PANEL  CBC     EKG 10/26/17: NSR. Nonspecific ST changes   CV:  Echo 12/03/17:  - Left ventricle: The cavity size was normal. Systolic function was normal. The estimated ejection fraction was in the range of 55% to 60%. Wall motion was normal; there were no regional wall motion abnormalities. - Inferior vena cava: The vessel was normal in size. The respirophasic diameter changes were in the normal range (>= 50%), consistent with normal central venous pressure.   Past Medical History:  Diagnosis Date  . Arthritis    hips  . Back pain   . Bladder infection    frequent /with Macrodantin usage  PRN  . Hyperlipidemia    borderline  . Hypertension   . Hypoglycemia    from DECREASED PROTEIN INTAKE  . Migraine headache    migraines  . Peripheral vascular disease ([place])    varicose veins/ with injections    Past Surgical History:  Procedure Laterality Date  . BREAST SURGERY     bilateral breast lumpectomy  . CATARACT EXTRACTION Right 2011  . COLONOSCOPY    . CORNEAL TRANSPLANT Right 2011  . EYE SURGERY    . SHOULDER SURGERY  Right 2015  . TOE SURGERY     right great toe scrapping for arthritis  . TOTAL HIP ARTHROPLASTY  02/17/2012   Procedure: TOTAL HIP ARTHROPLASTY;  Surgeon: Gearlean Alf, MD;  Location: [facility] ORS;  Service: Orthopedics;  Laterality: Right;    MEDICATIONS: . Boswellia Serrata (BOSWELLIA PO)  . Carboxymethylcellul-Glycerin (LUBRICATING EYE DROPS OP)  . Cranberry (ELLURA PO)  . estradiol (ESTRACE VAGINAL) 0.1 MG/GM vaginal cream  . GINKGO BILOBA PO  . ibuprofen (ADVIL,MOTRIN) 200 MG tablet  . loteprednol (LOTEMAX) 0.5 % ophthalmic suspension  . OVER THE COUNTER MEDICATION  . oxybutynin (DITROPAN-XL) 10 MG 24 hr tablet  . SUMAtriptan (IMITREX) 100 MG tablet  . triamterene-hydrochlorothiazide (MAXZIDE-25) 37.5-25 MG tablet  . TURMERIC CURCUMIN PO   No current facility-administered medications for this encounter.     If no changes, I anticipate pt can proceed with surgery as scheduled.   Willeen Cass, FNP-BC [facility]/Anesthesiology Phone: [phone] 04/11/2018 4:33 PM

## 2018-04-11 NOTE — Progress Notes (Signed)
PCP: Dr. Billey Gosling Cardiology: Dr. Kerry Kass as needed basis  Pt. Will sign consent DOS. States she needs to discuss with Dr. Ellene Route the procedure.

## 2018-04-13 ENCOUNTER — Other Ambulatory Visit (HOSPITAL_COMMUNITY): Payer: Self-pay | Admitting: Neurological Surgery

## 2018-04-13 ENCOUNTER — Other Ambulatory Visit: Payer: Self-pay | Admitting: Neurological Surgery

## 2018-04-13 ENCOUNTER — Other Ambulatory Visit: Payer: Self-pay | Admitting: Internal Medicine

## 2018-04-13 DIAGNOSIS — M5126 Other intervertebral disc displacement, lumbar region: Secondary | ICD-10-CM

## 2018-04-14 ENCOUNTER — Ambulatory Visit (HOSPITAL_COMMUNITY): Payer: Medicare Other

## 2018-04-17 NOTE — Anesthesia Preprocedure Evaluation (Addendum)
Anesthesia Evaluation  Patient identified by MRN, date of birth, ID band Patient awake    Reviewed: Allergy & Precautions, NPO status , Patient's Chart, lab work & pertinent test results  Airway Mallampati: III  TM Distance: >3 FB Neck ROM: Full    Dental no notable dental hx.    Pulmonary neg pulmonary ROS,    Pulmonary exam normal breath sounds clear to auscultation       Cardiovascular hypertension, Pt. on medications + Peripheral Vascular Disease  Normal cardiovascular exam Rhythm:Regular Rate:Normal  ECG: NSR, rate 76  ECHO: Left ventricle: The cavity size was normal. Systolic function was normal. The estimated ejection fraction was in the range of 55% to 60%. Wall motion was normal; there were no regional wall motion abnormalities. Inferior vena cava: The vessel was normal in size. The respirophasic diameter changes were in the normal range (>= 50%), consistent with normal central venous pressure.   Neuro/Psych  Headaches, negative psych ROS   GI/Hepatic negative GI ROS, Neg liver ROS,   Endo/Other  negative endocrine ROS  Renal/GU negative Renal ROS     Musculoskeletal negative musculoskeletal ROS (+)   Abdominal   Peds  Hematology HLD   Anesthesia Other Findings   Reproductive/Obstetrics                            Anesthesia Physical Anesthesia Plan  ASA: II  Anesthesia Plan: General   Post-op Pain Management:    Induction: Intravenous  PONV Risk Score and Plan: 3 and Midazolam, Dexamethasone, Ondansetron and Treatment may vary due to age or medical condition  Airway Management Planned: Oral ETT  Additional Equipment:   Intra-op Plan:   Post-operative Plan: Extubation in OR  Informed Consent: I have reviewed the patients History and Physical, chart, labs and discussed the procedure including the risks, benefits and alternatives for the proposed anesthesia with the  patient or authorized representative who has indicated his/her understanding and acceptance.   Dental advisory given  Plan Discussed with: CRNA  Anesthesia Plan Comments:        Anesthesia Quick Evaluation

## 2018-04-18 ENCOUNTER — Ambulatory Visit (HOSPITAL_COMMUNITY): Payer: Medicare Other | Admitting: Anesthesiology

## 2018-04-18 ENCOUNTER — Encounter (HOSPITAL_COMMUNITY)
Admission: RE | Disposition: A | Discharge status: Home / Self Care | Payer: Self-pay | Source: Ambulatory Visit | Attending: Neurological Surgery

## 2018-04-18 ENCOUNTER — Encounter (HOSPITAL_COMMUNITY): Payer: Self-pay | Admitting: Certified Registered"

## 2018-04-18 ENCOUNTER — Ambulatory Visit (HOSPITAL_COMMUNITY): Payer: Medicare Other | Admitting: Emergency Medicine

## 2018-04-18 ENCOUNTER — Ambulatory Visit (HOSPITAL_COMMUNITY): Payer: Medicare Other

## 2018-04-18 ENCOUNTER — Observation Stay (HOSPITAL_COMMUNITY)
Admission: RE | Admit: 2018-04-18 | Discharge: 2018-04-20 | Disposition: A | Discharge status: Home / Self Care | Payer: Medicare Other | Source: Ambulatory Visit | Attending: Neurological Surgery | Admitting: Neurological Surgery

## 2018-04-18 ENCOUNTER — Other Ambulatory Visit: Payer: Self-pay

## 2018-04-18 DIAGNOSIS — M4326 Fusion of spine, lumbar region: Secondary | ICD-10-CM | POA: Diagnosis not present

## 2018-04-18 DIAGNOSIS — I739 Peripheral vascular disease, unspecified: Secondary | ICD-10-CM | POA: Diagnosis not present

## 2018-04-18 DIAGNOSIS — M4316 Spondylolisthesis, lumbar region: Secondary | ICD-10-CM | POA: Diagnosis not present

## 2018-04-18 DIAGNOSIS — Z419 Encounter for procedure for purposes other than remedying health state, unspecified: Secondary | ICD-10-CM

## 2018-04-18 DIAGNOSIS — M545 Low back pain: Secondary | ICD-10-CM | POA: Diagnosis present

## 2018-04-18 DIAGNOSIS — M48062 Spinal stenosis, lumbar region with neurogenic claudication: Secondary | ICD-10-CM | POA: Diagnosis present

## 2018-04-18 DIAGNOSIS — M5116 Intervertebral disc disorders with radiculopathy, lumbar region: Secondary | ICD-10-CM | POA: Diagnosis not present

## 2018-04-18 DIAGNOSIS — I10 Essential (primary) hypertension: Secondary | ICD-10-CM | POA: Diagnosis not present

## 2018-04-18 DIAGNOSIS — M5416 Radiculopathy, lumbar region: Secondary | ICD-10-CM | POA: Diagnosis not present

## 2018-04-18 DIAGNOSIS — Z96641 Presence of right artificial hip joint: Secondary | ICD-10-CM | POA: Diagnosis not present

## 2018-04-18 DIAGNOSIS — M48061 Spinal stenosis, lumbar region without neurogenic claudication: Secondary | ICD-10-CM | POA: Diagnosis not present

## 2018-04-18 DIAGNOSIS — Z947 Corneal transplant status: Secondary | ICD-10-CM | POA: Diagnosis not present

## 2018-04-18 DIAGNOSIS — M5126 Other intervertebral disc displacement, lumbar region: Secondary | ICD-10-CM | POA: Diagnosis not present

## 2018-04-18 HISTORY — PX: ANTERIOR LAT LUMBAR FUSION: SHX1168

## 2018-04-18 LAB — ABO/RH: ABO/RH(D): B POS

## 2018-04-18 LAB — TYPE AND SCREEN
ABO/RH(D): B POS
Antibody Screen: NEGATIVE

## 2018-04-18 SURGERY — ANTERIOR LATERAL LUMBAR FUSION 1 LEVEL
Anesthesia: General

## 2018-04-18 MED ORDER — SODIUM CHLORIDE 0.9 % IV SOLN
INTRAVENOUS | Status: DC | PRN
  Administered 2018-04-18: 13:00:00

## 2018-04-18 MED ORDER — HYDROCODONE-ACETAMINOPHEN 5-325 MG PO TABS
1.0000 | ORAL_TABLET | ORAL | Status: DC | PRN
  Administered 2018-04-19 – 2018-04-20 (×7): 2 via ORAL
  Filled 2018-04-18 (×7): qty 2

## 2018-04-18 MED ORDER — HYDROMORPHONE HCL 2 MG/ML IJ SOLN
INTRAMUSCULAR | Status: AC
  Filled 2018-04-18: qty 1

## 2018-04-18 MED ORDER — CEFAZOLIN SODIUM-DEXTROSE 2-4 GM/100ML-% IV SOLN
2.0000 g | Freq: Three times a day (TID) | INTRAVENOUS | Status: AC
  Administered 2018-04-18 – 2018-04-19 (×2): 2 g via INTRAVENOUS
  Filled 2018-04-18 (×2): qty 100

## 2018-04-18 MED ORDER — SUCCINYLCHOLINE CHLORIDE 20 MG/ML IJ SOLN
INTRAMUSCULAR | Status: DC | PRN
  Administered 2018-04-18: 80 mg via INTRAVENOUS

## 2018-04-18 MED ORDER — ONDANSETRON HCL 4 MG/2ML IJ SOLN
4.0000 mg | Freq: Four times a day (QID) | INTRAMUSCULAR | Status: DC | PRN
  Administered 2018-04-18 – 2018-04-19 (×3): 4 mg via INTRAVENOUS
  Filled 2018-04-18 (×3): qty 2

## 2018-04-18 MED ORDER — SODIUM CHLORIDE 0.9 % IV SOLN: 250.0000 mL | INTRAVENOUS | Status: DC

## 2018-04-18 MED ORDER — FENTANYL CITRATE (PF) 100 MCG/2ML IJ SOLN
INTRAMUSCULAR | Status: DC | PRN
  Administered 2018-04-18 (×3): 50 ug via INTRAVENOUS
  Administered 2018-04-18: 100 ug via INTRAVENOUS

## 2018-04-18 MED ORDER — OXYBUTYNIN CHLORIDE ER 10 MG PO TB24
10.0000 mg | ORAL_TABLET | Freq: Every day | ORAL | Status: DC
  Administered 2018-04-19 (×2): 10 mg via ORAL
  Filled 2018-04-18 (×2): qty 1

## 2018-04-18 MED ORDER — MIDAZOLAM HCL 5 MG/5ML IJ SOLN
INTRAMUSCULAR | Status: DC | PRN
  Administered 2018-04-18: 1 mg via INTRAVENOUS

## 2018-04-18 MED ORDER — OXYCODONE HCL 5 MG/5ML PO SOLN: 5.0000 mg | Freq: Once | ORAL | Status: DC | PRN

## 2018-04-18 MED ORDER — SUMATRIPTAN SUCCINATE 100 MG PO TABS: 100.0000 mg | ORAL_TABLET | ORAL | Status: DC | PRN

## 2018-04-18 MED ORDER — MIDAZOLAM HCL 2 MG/2ML IJ SOLN
INTRAMUSCULAR | Status: AC
  Filled 2018-04-18: qty 2

## 2018-04-18 MED ORDER — DEXAMETHASONE SODIUM PHOSPHATE 10 MG/ML IJ SOLN
INTRAMUSCULAR | Status: DC | PRN
  Administered 2018-04-18: 10 mg via INTRAVENOUS

## 2018-04-18 MED ORDER — OXYCODONE HCL 5 MG PO TABS: 5.0000 mg | ORAL_TABLET | Freq: Once | ORAL | Status: DC | PRN

## 2018-04-18 MED ORDER — CHLORHEXIDINE GLUCONATE CLOTH 2 % EX PADS: 6.0000 | MEDICATED_PAD | Freq: Once | CUTANEOUS | Status: DC

## 2018-04-18 MED ORDER — DEXAMETHASONE SODIUM PHOSPHATE 10 MG/ML IJ SOLN
10.0000 mg | INTRAMUSCULAR | Status: AC
  Administered 2018-04-18: 10 mg via INTRAVENOUS
  Filled 2018-04-18: qty 1

## 2018-04-18 MED ORDER — BISACODYL 10 MG RE SUPP: 10.0000 mg | Freq: Every day | RECTAL | Status: DC | PRN

## 2018-04-18 MED ORDER — CARBOXYMETHYLCELLUL-GLYCERIN 0.5-0.9 % OP SOLN: 1.0000 [drp] | OPHTHALMIC | Status: DC | PRN

## 2018-04-18 MED ORDER — FLEET ENEMA 7-19 GM/118ML RE ENEM: 1.0000 | ENEMA | Freq: Once | RECTAL | Status: DC | PRN

## 2018-04-18 MED ORDER — LIDOCAINE-EPINEPHRINE 1 %-1:100000 IJ SOLN
INTRAMUSCULAR | Status: DC | PRN
  Administered 2018-04-18: 4.5 mL

## 2018-04-18 MED ORDER — 0.9 % SODIUM CHLORIDE (POUR BTL) OPTIME
TOPICAL | Status: DC | PRN
  Administered 2018-04-18: 1000 mL

## 2018-04-18 MED ORDER — LACTATED RINGERS IV SOLN
INTRAVENOUS | Status: DC
  Administered 2018-04-18: 10:00:00 via INTRAVENOUS

## 2018-04-18 MED ORDER — ACETAMINOPHEN 650 MG RE SUPP: 650.0000 mg | RECTAL | Status: DC | PRN

## 2018-04-18 MED ORDER — PROPOFOL 10 MG/ML IV BOLUS
INTRAVENOUS | Status: AC
  Filled 2018-04-18: qty 20

## 2018-04-18 MED ORDER — LACTATED RINGERS IV SOLN: INTRAVENOUS | Status: DC

## 2018-04-18 MED ORDER — ONDANSETRON HCL 4 MG PO TABS: 4.0000 mg | ORAL_TABLET | Freq: Four times a day (QID) | ORAL | Status: DC | PRN

## 2018-04-18 MED ORDER — LOTEPREDNOL ETABONATE 0.5 % OP SUSP
1.0000 [drp] | Freq: Every day | OPHTHALMIC | Status: DC
  Administered 2018-04-19 – 2018-04-20 (×2): 1 [drp] via OPHTHALMIC
  Filled 2018-04-18: qty 5

## 2018-04-18 MED ORDER — HYDROMORPHONE HCL 2 MG/ML IJ SOLN
0.2500 mg | INTRAMUSCULAR | Status: DC | PRN
  Administered 2018-04-18: 0.5 mg via INTRAVENOUS

## 2018-04-18 MED ORDER — CEFAZOLIN SODIUM-DEXTROSE 2-4 GM/100ML-% IV SOLN
2.0000 g | INTRAVENOUS | Status: AC
  Administered 2018-04-18: 2 g via INTRAVENOUS
  Filled 2018-04-18: qty 100

## 2018-04-18 MED ORDER — CEFAZOLIN SODIUM-DEXTROSE 2-4 GM/100ML-% IV SOLN: 2.0000 g | INTRAVENOUS | Status: DC

## 2018-04-18 MED ORDER — PROPOFOL 10 MG/ML IV BOLUS
INTRAVENOUS | Status: DC | PRN
  Administered 2018-04-18: 150 mg via INTRAVENOUS

## 2018-04-18 MED ORDER — OXYCODONE-ACETAMINOPHEN 5-325 MG PO TABS
1.0000 | ORAL_TABLET | ORAL | Status: DC | PRN
  Administered 2018-04-18 (×2): 2 via ORAL
  Filled 2018-04-18 (×2): qty 2

## 2018-04-18 MED ORDER — TRIAMTERENE-HCTZ 37.5-25 MG PO TABS
1.0000 | ORAL_TABLET | Freq: Every day | ORAL | Status: DC
  Administered 2018-04-18 – 2018-04-19 (×2): 1 via ORAL
  Filled 2018-04-18 (×2): qty 1

## 2018-04-18 MED ORDER — ACETAMINOPHEN 325 MG PO TABS: 650.0000 mg | ORAL_TABLET | ORAL | Status: DC | PRN

## 2018-04-18 MED ORDER — THROMBIN 5000 UNITS EX SOLR
CUTANEOUS | Status: AC
  Filled 2018-04-18: qty 5000

## 2018-04-18 MED ORDER — PHENOL 1.4 % MT LIQD: 1.0000 | OROMUCOSAL | Status: DC | PRN

## 2018-04-18 MED ORDER — POLYETHYLENE GLYCOL 3350 17 G PO PACK: 17.0000 g | PACK | Freq: Every day | ORAL | Status: DC | PRN

## 2018-04-18 MED ORDER — LACTATED RINGERS IV SOLN
INTRAVENOUS | Status: DC | PRN
  Administered 2018-04-18 (×2): via INTRAVENOUS

## 2018-04-18 MED ORDER — MENTHOL 3 MG MT LOZG: 1.0000 | LOZENGE | OROMUCOSAL | Status: DC | PRN

## 2018-04-18 MED ORDER — DOCUSATE SODIUM 100 MG PO CAPS
100.0000 mg | ORAL_CAPSULE | Freq: Two times a day (BID) | ORAL | Status: DC
  Administered 2018-04-18 – 2018-04-20 (×4): 100 mg via ORAL
  Filled 2018-04-18 (×5): qty 1

## 2018-04-18 MED ORDER — FENTANYL CITRATE (PF) 250 MCG/5ML IJ SOLN
INTRAMUSCULAR | Status: AC
  Filled 2018-04-18: qty 5

## 2018-04-18 MED ORDER — SENNA 8.6 MG PO TABS
1.0000 | ORAL_TABLET | Freq: Two times a day (BID) | ORAL | Status: DC
  Administered 2018-04-18 – 2018-04-20 (×4): 8.6 mg via ORAL
  Filled 2018-04-18 (×4): qty 1

## 2018-04-18 MED ORDER — LIDOCAINE HCL (CARDIAC) PF 100 MG/5ML IV SOSY
PREFILLED_SYRINGE | INTRAVENOUS | Status: DC | PRN
  Administered 2018-04-18: 50 mg via INTRAVENOUS

## 2018-04-18 MED ORDER — METHOCARBAMOL 500 MG PO TABS
500.0000 mg | ORAL_TABLET | Freq: Four times a day (QID) | ORAL | Status: DC | PRN
  Administered 2018-04-18 – 2018-04-20 (×5): 500 mg via ORAL
  Filled 2018-04-18 (×5): qty 1

## 2018-04-18 MED ORDER — BUPIVACAINE HCL (PF) 0.25 % IJ SOLN
INTRAMUSCULAR | Status: AC
  Filled 2018-04-18: qty 30

## 2018-04-18 MED ORDER — PROMETHAZINE HCL 25 MG/ML IJ SOLN: 6.2500 mg | INTRAMUSCULAR | Status: DC | PRN

## 2018-04-18 MED ORDER — PHENYLEPHRINE HCL 10 MG/ML IJ SOLN
INTRAVENOUS | Status: DC | PRN
  Administered 2018-04-18: 25 ug/min via INTRAVENOUS

## 2018-04-18 MED ORDER — LIDOCAINE-EPINEPHRINE 1 %-1:100000 IJ SOLN
INTRAMUSCULAR | Status: AC
  Filled 2018-04-18: qty 1

## 2018-04-18 MED ORDER — ALUM & MAG HYDROXIDE-SIMETH 200-200-20 MG/5ML PO SUSP: 30.0000 mL | Freq: Four times a day (QID) | ORAL | Status: DC | PRN

## 2018-04-18 MED ORDER — THROMBIN (RECOMBINANT) 5000 UNITS EX SOLR
CUTANEOUS | Status: DC | PRN
  Administered 2018-04-18: 13:00:00 via TOPICAL

## 2018-04-18 MED ORDER — SODIUM CHLORIDE 0.9% FLUSH: 3.0000 mL | Freq: Two times a day (BID) | INTRAVENOUS | Status: DC

## 2018-04-18 MED ORDER — SODIUM CHLORIDE 0.9% FLUSH: 3.0000 mL | INTRAVENOUS | Status: DC | PRN

## 2018-04-18 MED ORDER — EPHEDRINE SULFATE 50 MG/ML IJ SOLN
INTRAMUSCULAR | Status: DC | PRN
  Administered 2018-04-18 (×2): 5 mg via INTRAVENOUS

## 2018-04-18 MED ORDER — METHOCARBAMOL 1000 MG/10ML IJ SOLN: 500.0000 mg | Freq: Four times a day (QID) | INTRAVENOUS | Status: DC | PRN

## 2018-04-18 MED ORDER — MORPHINE SULFATE (PF) 4 MG/ML IV SOLN
2.0000 mg | INTRAVENOUS | Status: DC | PRN
  Administered 2018-04-18: 2 mg via INTRAVENOUS
  Filled 2018-04-18: qty 1

## 2018-04-18 MED ORDER — PROPOFOL 500 MG/50ML IV EMUL
INTRAVENOUS | Status: DC | PRN
  Administered 2018-04-18: 50 ug/kg/min via INTRAVENOUS

## 2018-04-18 MED ORDER — BUPIVACAINE HCL (PF) 0.25 % IJ SOLN
INTRAMUSCULAR | Status: DC | PRN
Start: 2018-04-18 — End: 2018-04-18
  Administered 2018-04-18: 4.5 mL

## 2018-04-18 MED ORDER — ONDANSETRON HCL 4 MG/2ML IJ SOLN
INTRAMUSCULAR | Status: DC | PRN
  Administered 2018-04-18: 4 mg via INTRAVENOUS

## 2018-04-18 SURGICAL SUPPLY — 66 items
ADH SKN CLS APL DERMABOND .7 (GAUZE/BANDAGES/DRESSINGS) ×1
APL SKNCLS STERI-STRIP NONHPOA (GAUZE/BANDAGES/DRESSINGS) ×1
BAG DECANTER FOR FLEXI CONT (MISCELLANEOUS) ×2 IMPLANT
BENZOIN TINCTURE PRP APPL 2/3 (GAUZE/BANDAGES/DRESSINGS) ×2 IMPLANT
BLADE CLIPPER SURG (BLADE) IMPLANT
BOLT SPNL LRG 45X5.5XPLAT NS (Screw) IMPLANT
BONE MATRIX OSTEOCEL PRO MED (Bone Implant) ×1 IMPLANT
CAGE MODULUS XLW 12X22X45 - 10 (Cage) ×1 IMPLANT
CARTRIDGE OIL MAESTRO DRILL (MISCELLANEOUS) ×1 IMPLANT
CONT SPEC 4OZ CLIKSEAL STRL BL (MISCELLANEOUS) ×2 IMPLANT
COVER BACK TABLE 24X17X13 BIG (DRAPES) IMPLANT
COVER BACK TABLE 60X90IN (DRAPES) ×2 IMPLANT
DERMABOND ADVANCED (GAUZE/BANDAGES/DRESSINGS) ×1
DERMABOND ADVANCED .7 DNX12 (GAUZE/BANDAGES/DRESSINGS) ×3 IMPLANT
DIFFUSER DRILL AIR PNEUMATIC (MISCELLANEOUS) ×1 IMPLANT
DRAPE C-ARM 42X72 X-RAY (DRAPES) ×3 IMPLANT
DRAPE C-ARMOR (DRAPES) ×3 IMPLANT
DRAPE LAPAROTOMY 100X72X124 (DRAPES) ×3 IMPLANT
DRAPE POUCH INSTRU U-SHP 10X18 (DRAPES) ×2 IMPLANT
DRAPE SURG 17X23 STRL (DRAPES) ×2 IMPLANT
DRSG OPSITE POSTOP 3X4 (GAUZE/BANDAGES/DRESSINGS) ×2 IMPLANT
DURAPREP 26ML APPLICATOR (WOUND CARE) ×3 IMPLANT
ELECT REM PT RETURN 9FT ADLT (ELECTROSURGICAL) ×2
ELECTRODE REM PT RTRN 9FT ADLT (ELECTROSURGICAL) ×2 IMPLANT
GAUZE SPONGE 4X4 12PLY STRL (GAUZE/BANDAGES/DRESSINGS) ×1 IMPLANT
GAUZE SPONGE 4X4 16PLY XRAY LF (GAUZE/BANDAGES/DRESSINGS) IMPLANT
GLOVE BIOGEL PI IND STRL 8.5 (GLOVE) ×3 IMPLANT
GLOVE BIOGEL PI INDICATOR 8.5 (GLOVE) ×2
GLOVE ECLIPSE 8.5 STRL (GLOVE) ×5 IMPLANT
GLOVE EXAM NITRILE LRG STRL (GLOVE) IMPLANT
GLOVE EXAM NITRILE XL STR (GLOVE) IMPLANT
GLOVE EXAM NITRILE XS STR PU (GLOVE) IMPLANT
GOWN STRL REUS W/ TWL LRG LVL3 (GOWN DISPOSABLE) IMPLANT
GOWN STRL REUS W/ TWL XL LVL3 (GOWN DISPOSABLE) ×3 IMPLANT
GOWN STRL REUS W/TWL 2XL LVL3 (GOWN DISPOSABLE) ×6 IMPLANT
GOWN STRL REUS W/TWL LRG LVL3 (GOWN DISPOSABLE)
GOWN STRL REUS W/TWL XL LVL3 (GOWN DISPOSABLE) ×4
KIT BASIN OR (CUSTOM PROCEDURE TRAY) ×3 IMPLANT
KIT DILATOR XLIF 5 (KITS) ×1 IMPLANT
KIT SURGICAL ACCESS MAXCESS 4 (KITS) ×1 IMPLANT
KIT TURNOVER KIT B (KITS) ×3 IMPLANT
MARKER SKIN DUAL TIP RULER LAB (MISCELLANEOUS) ×3 IMPLANT
MODULE EMG NDL SSEP NVM5 (NEEDLE) IMPLANT
MODULE EMG NEEDLE SSEP NVM5 (NEEDLE) ×2 IMPLANT
MODULE NVM5 NEXT GEN EMG (NEEDLE) ×1 IMPLANT
NDL HYPO 25X1 1.5 SAFETY (NEEDLE) ×2 IMPLANT
NEEDLE HYPO 25X1 1.5 SAFETY (NEEDLE) ×2 IMPLANT
NS IRRIG 1000ML POUR BTL (IV SOLUTION) ×3 IMPLANT
OIL CARTRIDGE MAESTRO DRILL (MISCELLANEOUS)
PACK LAMINECTOMY NEURO (CUSTOM PROCEDURE TRAY) ×3 IMPLANT
PAD ARMBOARD 7.5X6 YLW CONV (MISCELLANEOUS) ×7 IMPLANT
PATTIES SURGICAL .5 X.5 (GAUZE/BANDAGES/DRESSINGS) IMPLANT
PATTIES SURGICAL .5 X1 (DISPOSABLE) IMPLANT
PATTIES SURGICAL 1X1 (DISPOSABLE) IMPLANT
PLATE DECADE XLIP 2H SZ12 (Plate) ×1 IMPLANT
SCREW 45MM (Screw) ×4 IMPLANT
SPONGE LAP 4X18 X RAY DECT (DISPOSABLE) IMPLANT
STRIP CLOSURE SKIN 1/2X4 (GAUZE/BANDAGES/DRESSINGS) ×2 IMPLANT
SUT VIC AB 1 CT1 18XBRD ANBCTR (SUTURE) ×2 IMPLANT
SUT VIC AB 1 CT1 8-18 (SUTURE) ×2
SUT VIC AB 2-0 CP2 18 (SUTURE) ×4 IMPLANT
SUT VIC AB 3-0 SH 8-18 (SUTURE) ×4 IMPLANT
TOWEL GREEN STERILE (TOWEL DISPOSABLE) ×3 IMPLANT
TOWEL GREEN STERILE FF (TOWEL DISPOSABLE) ×3 IMPLANT
TRAY FOLEY MTR SLVR 16FR STAT (SET/KITS/TRAYS/PACK) ×4 IMPLANT
WATER STERILE IRR 1000ML POUR (IV SOLUTION) ×3 IMPLANT

## 2018-04-18 NOTE — Progress Notes (Signed)
Orthopedic Tech Progress Note Patient Details:  [name] 1946-06-13 [id number] Brace completed by bio-tech. Patient ID: Deborah Hudson, female   DOB: Sep 21, 1946, 72 y.o.   MRN: [id number]   Braulio Bosch 04/18/2018, 4:15 PM

## 2018-04-18 NOTE — Evaluation (Signed)
Physical Therapy Evaluation Patient Details Name: Deborah Hudson MRN: [id number] DOB: 1946-11-15 Today's Date: 04/18/2018   History of Present Illness  Patient is a 72 yo female s/p Lumbar Three and Lumbar Four anterior lateral decompression with lateral plate fixation  Clinical Impression  Patient seen for mobility assessment s/p spinal surgery.  Patient demonstrates deficits in functional mobility as indicated below. Will benefit from continued skilled PT to address deficits and maximize function. Will see as indicated to address car transfers and stairs next session.    Follow Up Recommendations No PT follow up    Equipment Recommendations  None recommended by PT    Recommendations for Other Services       Precautions / Restrictions Precautions Precautions: Back Precaution Booklet Issued: Yes (comment) Precaution Comments: verbally reviewed Required Braces or Orthoses: Spinal Brace Spinal Brace: Lumbar corset Restrictions Weight Bearing Restrictions: No      Mobility  Bed Mobility Overal bed mobility: Needs Assistance Bed Mobility: Sidelying to Sit   Sidelying to sit: Min assist       General bed mobility comments: VCs for technique  Transfers Overall transfer level: Needs assistance Equipment used: 1 person hand held assist Transfers: Sit to/from Stand Sit to Stand: Supervision         General transfer comment: Able to stand from bed and from toilet with increased time  Ambulation/Gait Ambulation/Gait assistance: Min assist Ambulation Distance (Feet): 160 Feet Assistive device: 1 person hand held assist Gait Pattern/deviations: Step-through pattern;Decreased stride length Gait velocity: decreased Gait velocity interpretation: <1.8 ft/sec, indicate of risk for recurrent falls General Gait Details: guarded and slow with gait, modest instbaility. VCs for increased cadence  Stairs            Wheelchair Mobility    Modified Rankin (Stroke Patients  Only)       Balance Overall balance assessment: Mild deficits observed, not formally tested                                           Pertinent Vitals/Pain Pain Assessment: 0-10 Pain Score: 5  Pain Location: back and surgical site Pain Descriptors / Indicators: Sore Pain Intervention(s): Monitored during session    Home Living Family/patient expects to be discharged to:: Private residence Living Arrangements: Spouse/significant other   Type of Home: House Home Access: Stairs to enter Entrance Stairs-Rails: Can reach both Technical brewer of Steps: 3 Home Layout: One level        Prior Function Level of Independence: Independent               Hand Dominance   Dominant Hand: Right    Extremity/Trunk Assessment        Lower Extremity Assessment Lower Extremity Assessment: Overall WFL for tasks assessed    Cervical / Trunk Assessment Cervical / Trunk Assessment: (s/p spinal surgery)  Communication   Communication: No difficulties  Cognition Arousal/Alertness: Awake/alert Behavior During Therapy: WFL for tasks assessed/performed Overall Cognitive Status: Within Functional Limits for tasks assessed                                        General Comments      Exercises     Assessment/Plan    PT Assessment Patient needs continued PT services  PT Problem List Decreased strength;Decreased  activity tolerance;Decreased balance;Decreased mobility       PT Treatment Interventions Gait training;Stair training;Functional mobility training;Therapeutic activities;Therapeutic exercise;Balance training;Patient/family education    PT Goals (Current goals can be found in the Care Plan section)  Acute Rehab PT Goals Patient Stated Goal: to go home PT Goal Formulation: With patient Time For Goal Achievement: 05/02/18 Potential to Achieve Goals: Good    Frequency Min 5X/week   Barriers to discharge         Co-evaluation               AM-PAC PT "6 Clicks" Daily Activity  Outcome Measure Difficulty turning over in bed (including adjusting bedclothes, sheets and blankets)?: A Little Difficulty moving from lying on back to sitting on the side of the bed? : A Little Difficulty sitting down on and standing up from a chair with arms (e.g., wheelchair, bedside commode, etc,.)?: A Little Help needed moving to and from a bed to chair (including a wheelchair)?: A Little Help needed walking in hospital room?: A Little Help needed climbing 3-5 steps with a railing? : A Little 6 Click Score: 18    End of Session Equipment Utilized During Treatment: Gait belt;Back brace Activity Tolerance: Patient tolerated treatment well Patient left: in chair;with call bell/phone within reach;with family/visitor present Nurse Communication: Mobility status PT Visit Diagnosis: Difficulty in walking, not elsewhere classified (R26.2)    Time: 1650-1710 PT Time Calculation (min) (ACUTE ONLY): 20 min   Charges:   PT Evaluation $PT Eval Moderate Complexity: 1 Mod     PT G Codes:        Alben Deeds, PT DPT  Board Certified Neurologic Specialist [place] 04/18/2018, 5:19 PM

## 2018-04-18 NOTE — Op Note (Signed)
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  Stabilization with x-lif spacer and ostieocell allograft.  EMG and somatosensory monitoring Surgeon: Kristeen Miss First assistant: Newman Pies, MD Anesthesia: General endotracheal Indications: Deborah Hudson is a 72 year old individual who is had significant back and lower extremity pain discomfort for prolonged period of time.  She has a disc herniation at the level of L3-L4 along with advanced degenerative changes and a spondylolisthesis creating a moderately severe central stenosis.  She is failed all manner of conservative management and was advised that she undergo anterior decompression with an anterolateral fixation using an ex lift technique.  Procedure: The patient was brought to the operating room supine on the stretcher.  After the smooth induction of general endotracheal anesthesia she was turned into the right lateral decubitus position and the iliac crest was secured to the bed with tape after checking orthogonal allergy with radiographic confirmation.  EMG and somatosensory neural monitoring was performed during this case.  Then the L3-4 area was localized radiographically and a direct lateral approach was marked on the skin.  The skin was prepped with alcohol DuraPrep and draped in a sterile fashion.  A vertical incision was made in the chosen area of entry and a second posterior incision was used to identify the retroperitoneal space positively.  Then a lateral probe was placed through the lateral incision and was palpated with the opposite hand through the posterior incision and guided to the anterior border of the psoas muscle.  He was then placed onto the interspace at L3-L4.  This was all confirmed radiographically.  The K wire was placed into the  outer probe after EMG stimulation was performed to make sure that no nerves were entrapped in this area.  Once we were assured of this K wire was placed in the series of dilators were placed checking to make sure that there was no nearby nerves that were in position to be damaged.  A lateral retractor was then placed over the 15 mm dilator.  Then under radiographic verification Shim was placed into the interspace at L3-L4.  Its position was checked after doing several rounds of monitoring to make sure that there was no neural compromise.  The lateral aspect of the disc space was then cleared and 15 blade was used to enter it.  A series of disc shavers were then used to remove a market amount of degenerated disc material from L3-L4.  The endplates were also decorticated with use of respiratory's in addition to rongeurs.  A series of dilators were then placed into the interspace and ultimately was felt that a 12 mm tall 10 degree lordotic 22 mm wide spacer would fit best into this interval.  The endplates were again checked for adequate decortication when this was verified the spacer was filled with Oxycel and placed into the interspace.  A 45 mm wide spacer was chosen.  Lateral plate was then affixed with 2 long 45 mm screws checking radiographic confirmation of birth [place].  When this was all verified ultimately the retractor was removed and the area was inspected good hemostasis was noted the lateral incisions were closed with 2-0 and 3-0 Vicryl suture posterior incision was closed similarly.  Blood loss was estimated 20 cc patient was returned to recovery room in stable condition.

## 2018-04-18 NOTE — H&P (Signed)
Deborah Hudson is an 72 y.o. female.   Chief Complaint: Buttock and low back pain HPI: Patient is a 72 year old individuals had chronic low back and buttock pain.  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  She is failed all manner of conservative effort and I advised that surgical decompression of L3-L4 which also has a grade 1 spondylolisthesis may give her best relief of the worst of her symptoms.  After questioning it is apparent that she has symptoms of neurogenic claudication.  And I have advised an anterolateral decompression at L3-L4.  Past Medical History:  Diagnosis Date  . Arthritis    hips  . Back pain   . Bladder infection    frequent /with Macrodantin usage  PRN  . Hyperlipidemia    borderline  . Hypertension   . Hypoglycemia    from DECREASED PROTEIN INTAKE  . Migraine headache    migraines  . Peripheral vascular disease ([place])    varicose veins/ with injections    Past Surgical History:  Procedure Laterality Date  . BREAST SURGERY     bilateral breast lumpectomy  . CATARACT EXTRACTION Right 2011  . COLONOSCOPY    . CORNEAL TRANSPLANT Right 2011  . EYE SURGERY    . SHOULDER SURGERY Right 2015  . TOE SURGERY     right great toe scrapping for arthritis  . TOTAL HIP ARTHROPLASTY  02/17/2012   Procedure: TOTAL HIP ARTHROPLASTY;  Surgeon: Gearlean Alf, MD;  Location: [facility];  Service: Orthopedics;  Laterality: Right;    Family History  Problem Relation Age of Onset  . Cancer Mother        lung  . Alzheimer's disease Mother   . Depression Mother   . Stroke Father   . Cancer Maternal Grandfather        lung  . Stroke Maternal Grandmother    Social History:  reports that she has never smoked. She has never used smokeless tobacco. She reports that she drinks about 1.2 oz of alcohol per week. She reports that she does not use  drugs.  Allergies:  Allergies  Allergen Reactions  . Adhesive [Tape]     Rash at the site  . Sulfa Antibiotics Itching    No medications prior to admission.    No results found for this or any previous visit (from the past 48 hour(s)). No results found.  Review of Systems  Constitutional: Negative.   HENT: Negative.   Respiratory: Negative.   Musculoskeletal: Positive for back pain.  Skin: Negative.   Neurological:       Symptoms of neurogenic claudication   Psychiatric/Behavioral: Negative.     There were no vitals taken for this visit. Physical Exam  Constitutional: She is oriented to person, place, and time. She appears well-developed and well-nourished.  HENT:  Head: Normocephalic and atraumatic.  Eyes: Pupils are equal, round, and reactive to light. Conjunctivae and EOM are normal.  Neck: Normal range of motion. Neck supple.  Cardiovascular: Normal rate and regular rhythm.  Respiratory: Effort normal and breath sounds normal.  GI: Soft. Bowel sounds are normal.  Musculoskeletal: Normal range of motion.  Neurological: She is alert and oriented to person, place, and time.  Weakness in the quadriceps bilaterally.  Absent patellar reflexes.  Skin: Skin is warm and dry.  Psychiatric: She has a  normal mood and affect. Her behavior is normal. Judgment and thought content normal.     Assessment/Plan Spondylolisthesis L3-L4 with severe stenosis.  Plan anterolateral decompression L3-L4 with lateral fixation.  Deborah Newport, MD 04/18/2018, 7:33 AM

## 2018-04-18 NOTE — Progress Notes (Signed)
Patient ID: Deborah Hudson, female   DOB: [birth year], 72 y.o.   MRN: [id number] Vital signs are stable Patient is awake and alert Comfortable postop No complaints of back or leg pain

## 2018-04-18 NOTE — Plan of Care (Signed)
  Problem: Safety: Goal: Ability to remain free from injury will improve Outcome: Progressing   Problem: Skin Integrity: Goal: Signs of wound healing will improve Outcome: Progressing   Problem: Activity: Goal: Ability to avoid complications of mobility impairment will improve Outcome: Progressing Goal: Ability to tolerate increased activity will improve Outcome: Progressing Goal: Will remain free from falls Outcome: Progressing   Problem: Bowel/Gastric: Goal: Gastrointestinal status for postoperative course will improve Outcome: Progressing   Problem: Education: Goal: Ability to verbalize activity precautions or restrictions will improve Outcome: Progressing Goal: Knowledge of the prescribed therapeutic regimen will improve Outcome: Progressing Goal: Understanding of discharge needs will improve Outcome: Progressing   Problem: Physical Regulation: Goal: Ability to maintain clinical measurements within normal limits will improve Outcome: Progressing Goal: Postoperative complications will be avoided or minimized Outcome: Progressing Goal: Diagnostic test results will improve Outcome: Progressing   Problem: Pain Management: Goal: Pain level will decrease Outcome: Progressing   Problem: Skin Integrity: Goal: Signs of wound healing will improve Outcome: Progressing   Problem: Health Behavior/Discharge Planning: Goal: Identification of resources available to assist in meeting health care needs will improve Outcome: Progressing   Problem: Bladder/Genitourinary: Goal: Urinary functional status for postoperative course will improve Outcome: Progressing

## 2018-04-18 NOTE — Transfer of Care (Signed)
Immediate Anesthesia Transfer of Care Note  Patient: Deborah Hudson  Procedure(s) Performed: Lumbar Three and Lumbar Four anterior lateral decompression with lateral plate fixation (N/A )  Patient Location: PACU  Anesthesia Type:General  Level of Consciousness: awake and drowsy  Airway & Oxygen Therapy: Patient Spontanous Breathing and Patient connected to nasal cannula oxygen  Post-op Assessment: Report given to RN, Post -op Vital signs reviewed and stable and Patient moving all extremities X 4  Post vital signs: Reviewed and stable  Last Vitals:  Vitals Value Taken Time  BP 118/75 04/18/2018  1:59 PM  Temp 36.5 C 04/18/2018  1:58 PM  Pulse 73 04/18/2018  2:01 PM  Resp 8 04/18/2018  2:01 PM  SpO2 99 % 04/18/2018  2:01 PM  Vitals shown include unvalidated device data.  Last Pain:  Vitals:   04/18/18 1358  TempSrc:   PainSc: 6       Patients Stated Pain Goal: 3 (41/96/22 2979)  Complications: No apparent anesthesia complications

## 2018-04-18 NOTE — Anesthesia Procedure Notes (Signed)
Procedure Name: Intubation Date/Time: 04/18/2018 11:41 AM Performed by: Gaylene Brooks, CRNA Pre-anesthesia Checklist: Patient identified, Emergency Drugs available, Suction available and Patient being monitored Patient Re-evaluated:Patient Re-evaluated prior to induction Oxygen Delivery Method: Circle System Utilized Preoxygenation: Pre-oxygenation with 100% oxygen Induction Type: IV induction Ventilation: Mask ventilation without difficulty Laryngoscope Size: Miller and 2 Grade View: Grade II Tube type: Oral Number of attempts: 1 Airway Equipment and Method: Stylet and Oral airway Placement Confirmation: ETT inserted through vocal cords under direct vision,  positive ETCO2 and breath sounds checked- equal and bilateral Secured at: 22 cm Tube secured with: Tape Dental Injury: Teeth and Oropharynx as per pre-operative assessment

## 2018-04-18 NOTE — Anesthesia Postprocedure Evaluation (Signed)
Anesthesia Post Note  Patient: Deborah Hudson  Procedure(s) Performed: Lumbar Three and Lumbar Four anterior lateral decompression with lateral plate fixation (N/A )     Patient location during evaluation: PACU Anesthesia Type: General Level of consciousness: awake and alert Pain management: pain level controlled Vital Signs Assessment: post-procedure vital signs reviewed and stable Respiratory status: spontaneous breathing, nonlabored ventilation, respiratory function stable and patient connected to nasal cannula oxygen Cardiovascular status: blood pressure returned to baseline and stable Postop Assessment: no apparent nausea or vomiting Anesthetic complications: no    Last Vitals:  Vitals:   04/18/18 1446 04/18/18 1508  BP:  112/70  Pulse:  66  Resp:  16  Temp: (P) 36.5 C 36.4 C  SpO2:  97%    Last Pain:  Vitals:   04/18/18 1539  TempSrc:   PainSc: 7                  Charlie Seda P Loni Abdon

## 2018-04-19 ENCOUNTER — Encounter (HOSPITAL_COMMUNITY): Payer: Self-pay | Admitting: Neurological Surgery

## 2018-04-19 DIAGNOSIS — M48061 Spinal stenosis, lumbar region without neurogenic claudication: Secondary | ICD-10-CM | POA: Diagnosis not present

## 2018-04-19 NOTE — Care Management Obs Status (Signed)
[facility] NOTIFICATION   Patient Details  Name: [name] MRN: [id number] Date of Birth: 13-May-1946   Medicare Observation Status Notification Given:  Yes    Carles Collet, RN 04/19/2018, 2:08 PM

## 2018-04-19 NOTE — Plan of Care (Signed)
  Problem: Pain Management: Goal: Pain level will decrease Outcome: Completed/Met   Problem: Bladder/Genitourinary: Goal: Urinary functional status for postoperative course will improve Outcome: Completed/Met

## 2018-04-19 NOTE — Evaluation (Signed)
Occupational Therapy Evaluation and Discharge Patient Details Name: Deborah Hudson MRN: [id number] DOB: [birth year] Today's Date: 04/19/2018    History of Present Illness Patient is a 72 yo female s/p Lumbar Three and Lumbar Four anterior lateral decompression with lateral plate fixation   Clinical Impression   PTA, pt was independent with ADL and functional mobility. Pt currently requiring min assist for LB ADL and toileting hygiene. Her husband demonstrates the ability to provide the necessary assistance. Discussed use of AE for LB ADL and pt does have reacher and plans to use this but prefers to utilize assistance from husband. They verbalize how to obtain this if at a later time she discovers that she needs AE. Pt educated concerning compensatory ADL strategies, safe shower and toilet transfers, and safe use of shower seat. Additionally educated concerning brace wear schedule, never scrub over incision, pat dry with clean towel once cleared for showering, and car transfers. Pt also asking about body mechanics to maximize back safety during and after recovery and OT provided education. Pt and husband report understanding of all topics and report no further questions or concerns. At this time, no further OT services necessary and OT will sign off. Thank you for this referral.    Follow Up Recommendations  No OT follow up;Supervision/Assistance - 24 hour    Equipment Recommendations  None recommended by OT(has needs met)    Recommendations for Other Services       Precautions / Restrictions Precautions Precautions: Back Precaution Booklet Issued: Yes (comment) Precaution Comments: Reviewed back precautions related to ADL in detail.  Required Braces or Orthoses: Spinal Brace Spinal Brace: Lumbar corset;Applied in sitting position Restrictions Weight Bearing Restrictions: No      Mobility Bed Mobility               General bed mobility comments: Pt sitting up in chair upon OT  arrival.   Transfers Overall transfer level: Needs assistance Equipment used: None Transfers: Sit to/from Stand Sit to Stand: Supervision         General transfer comment: Supervision for safety.     Balance Overall balance assessment: Mild deficits observed, not formally tested                                         ADL either performed or assessed with clinical judgement   ADL Overall ADL's : Needs assistance/impaired Eating/Feeding: Set up;Sitting   Grooming: Supervision/safety;Sitting   Upper Body Bathing: Supervision/ safety;Sitting   Lower Body Bathing: Minimal assistance;Sit to/from stand;With caregiver independent assisting   Upper Body Dressing : Supervision/safety;Sitting   Lower Body Dressing: Minimal assistance;Sit to/from stand;With caregiver independent assisting Lower Body Dressing Details (indicate cue type and reason): Pt has reacher and educated concerning use with LB dressing tasks. Pt educated concerning other AE and reports preference for assistance from husband.  Toilet Transfer: Supervision/safety;Ambulation Toilet Transfer Details (indicate cue type and reason): simulated with sit<>stand followed by ambulation in room Toileting- Clothing Manipulation and Hygiene: Minimal assistance;Sit to/from stand;With caregiver independent assisting   Tub/ Shower Transfer: Supervision/safety;Ambulation;Shower seat;Walk-in shower   Functional mobility during ADLs: Supervision/safety General ADL Comments: Pt and husband educated concerning compensatory ADL strategies to adhere to back precautions during LB ADL, toileting tasks, and grooming tasks. Pt with many questions and all answered satisfactorily. Pt educated concerning car transfers as well.      Vision Patient Visual Report: No  change from baseline Vision Assessment?: No apparent visual deficits     Perception     Praxis      Pertinent Vitals/Pain Pain Assessment: 0-10 Pain Score:  5  Faces Pain Scale: Hurts a little bit Pain Location: back and surgical site Pain Descriptors / Indicators: Sore Pain Intervention(s): Limited activity within patient's tolerance;Monitored during session;Repositioned     Hand Dominance Right   Extremity/Trunk Assessment     Lower Extremity Assessment Lower Extremity Assessment: Overall WFL for tasks assessed   Cervical / Trunk Assessment Cervical / Trunk Assessment: Other exceptions Cervical / Trunk Exceptions: s/p lumbar surgery   Communication Communication Communication: No difficulties   Cognition Arousal/Alertness: Awake/alert Behavior During Therapy: WFL for tasks assessed/performed Overall Cognitive Status: Within Functional Limits for tasks assessed                                     General Comments  Husband present and engaged in session.     Exercises     Shoulder Instructions      Home Living Family/patient expects to be discharged to:: Private residence Living Arrangements: Spouse/significant other Available Help at Discharge: Family Type of Home: House Home Access: Stairs to enter Technical brewer of Steps: 3 Entrance Stairs-Rails: Can reach both [place]: One level     Bathroom Shower/Tub: Occupational psychologist: Handicapped height     Home Equipment: [place] in;Toilet riser          Prior Functioning/Environment Level of Independence: Independent                 OT Problem List: Decreased activity tolerance;Impaired balance (sitting and/or standing);Decreased safety awareness;Decreased knowledge of use of DME or AE;Decreased knowledge of precautions;Pain      OT Treatment/Interventions:      OT Goals(Current goals can be found in the care plan section) Acute Rehab OT Goals Patient Stated Goal: to go home OT Goal Formulation: With patient  OT Frequency:     Barriers to D/C:            Co-evaluation               AM-PAC PT "6 Clicks" Daily Activity     Outcome Measure Help from another person eating meals?: None Help from another person taking care of personal grooming?: None Help from another person toileting, which includes using toliet, bedpan, or urinal?: A Little Help from another person bathing (including washing, rinsing, drying)?: A Little Help from another person to put on and taking off regular upper body clothing?: None Help from another person to put on and taking off regular lower body clothing?: A Little 6 Click Score: 21   End of Session Equipment Utilized During Treatment: Back brace Nurse Communication: Mobility status  Activity Tolerance: Patient tolerated treatment well Patient left: in chair;with call bell/phone within reach;with family/visitor present  OT Visit Diagnosis: Other abnormalities of gait and mobility (R26.89);Pain Pain - part of body: (lumbar spine)                Time: 4696-2952 OT Time Calculation (min): 46 min Charges:  OT General Charges $OT Visit: 1 Visit OT Evaluation $OT Eval Moderate Complexity: 1 Mod OT Treatments $Self Care/Home Management : 23-37 mins G-Codes:     Norman Herrlich, MS OTR/L  Pager: Deborah Hudson 04/19/2018, 10:28 AM

## 2018-04-19 NOTE — Progress Notes (Signed)
Physical Therapy Treatment and Discharge Patient Details Name: Deborah Hudson MRN: [id number] DOB: Mar 04, 1946 Today's Date: 04/19/2018    History of Present Illness Patient is a 72 yo female s/p Lumbar Three and Lumbar Four anterior lateral decompression with lateral plate fixation    PT Comments    Pt progressing well with post-op mobility. She was able to ambulate in hall with modified independence by the end of gait training and negotiated a flight of stairs with light supervision. Pt was educated on brace application/wearing schedule, car transfer, precautions, and activity progression. Will sign off at this time as pt has met all acute PT goals. If needs change, please reconsult.    Follow Up Recommendations  No PT follow up     Equipment Recommendations  None recommended by PT    Recommendations for Other Services       Precautions / Restrictions Precautions Precautions: Back Precaution Booklet Issued: Yes (comment) Precaution Comments: verbally reviewed Required Braces or Orthoses: Spinal Brace Spinal Brace: Lumbar corset;Applied in sitting position Restrictions Weight Bearing Restrictions: No    Mobility  Bed Mobility               General bed mobility comments: Pt sitting up in chair upon PT arrival.   Transfers Overall transfer level: Needs assistance Equipment used: None Transfers: Sit to/from Stand Sit to Stand: Modified independent (Device/Increase time)         General transfer comment: No assist required; no unsteadiness noted. Pt was cued for wide BOS when performing stand>sit to maintain upright posture.   Ambulation/Gait Ambulation/Gait assistance: Modified independent (Device/Increase time) Ambulation Distance (Feet): 300 Feet Assistive device: 1 person hand held assist Gait Pattern/deviations: Step-through pattern;Decreased stride length Gait velocity: decreased Gait velocity interpretation: 1.31 - 2.62 ft/sec, indicative of limited  community ambulator General Gait Details: Initially pt appeared guarded but progressed to mod I by end of gait training. No overt LOB noted.    Stairs Stairs: Yes Stairs assistance: Supervision Stair Management: Alternating pattern;Forwards Number of Stairs: 10 General stair comments: Pt was able to negotiate a flight of stairs without any apparent difficulty. VC's for sequencing and general safety.    Wheelchair Mobility    Modified Rankin (Stroke Patients Only)       Balance Overall balance assessment: Mild deficits observed, not formally tested                                          Cognition Arousal/Alertness: Awake/alert Behavior During Therapy: WFL for tasks assessed/performed Overall Cognitive Status: Within Functional Limits for tasks assessed                                        Exercises      General Comments        Pertinent Vitals/Pain Pain Assessment: Faces Faces Pain Scale: Hurts a little bit Pain Location: back and surgical site Pain Descriptors / Indicators: Sore Pain Intervention(s): Monitored during session    Home Living                      Prior Function            PT Goals (current goals can now be found in the care plan section) Acute Rehab PT Goals Patient Stated Goal:  to go home PT Goal Formulation: With patient Time For Goal Achievement: 05/02/18 Potential to Achieve Goals: Good Progress towards PT goals: Goals met/education completed, patient discharged from PT    Frequency    Min 5X/week      PT Plan Current plan remains appropriate    Co-evaluation              AM-PAC PT "6 Clicks" Daily Activity  Outcome Measure  Difficulty turning over in bed (including adjusting bedclothes, sheets and blankets)?: None Difficulty moving from lying on back to sitting on the side of the bed? : A Little Difficulty sitting down on and standing up from a chair with arms (e.g.,  wheelchair, bedside commode, etc,.)?: None Help needed moving to and from a bed to chair (including a wheelchair)?: None Help needed walking in hospital room?: None Help needed climbing 3-5 steps with a railing? : A Little 6 Click Score: 22    End of Session Equipment Utilized During Treatment: Gait belt;Back brace Activity Tolerance: Patient tolerated treatment well Patient left: in chair;with call bell/phone within reach;with family/visitor present Nurse Communication: Mobility status PT Visit Diagnosis: Difficulty in walking, not elsewhere classified (R26.2)     Time: 3692-2300 PT Time Calculation (min) (ACUTE ONLY): 20 min  Charges:  $Gait Training: 8-22 mins                    G Codes:       Rolinda Roan, PT, DPT Acute Rehabilitation Services Pager: [place] 04/19/2018, 9:28 AM

## 2018-04-19 NOTE — Progress Notes (Signed)
Patient ID: Deborah Hudson, female   DOB: Aug 10, 1946, 72 y.o.   MRN: [id number]  Vital signs are stable Incisions are clean and dry Patient had some significant episodes of nausea postoperatively Also had some bladder control issue briefly Motor function appears to be doing well Patient wants to mobilize today and stay in hospital an additional day which I believe is completely reasonable giving the scope of her surgery. We will reevaluate this afternoon Doing well postop

## 2018-04-20 ENCOUNTER — Telehealth: Payer: Self-pay | Admitting: *Deleted

## 2018-04-20 DIAGNOSIS — M48061 Spinal stenosis, lumbar region without neurogenic claudication: Secondary | ICD-10-CM | POA: Diagnosis not present

## 2018-04-20 MED ORDER — HYDROCODONE-ACETAMINOPHEN 5-325 MG PO TABS: 1.0000 | ORAL_TABLET | ORAL | 0 refills | Status: DC | PRN

## 2018-04-20 MED ORDER — METHOCARBAMOL 500 MG PO TABS: 500.0000 mg | ORAL_TABLET | Freq: Four times a day (QID) | ORAL | 3 refills | Status: DC | PRN

## 2018-04-20 NOTE — Telephone Encounter (Signed)
Pt was on TCM report was admitted 04/18/18 to undergo surgical decompression using an anterolateral technique at L3-L4.  She tolerated surgery well.Pt D/C 04/20/18, and will f/u w/surgeon.Marland KitchenJohny Chess

## 2018-04-20 NOTE — Discharge Summary (Signed)
Physician Discharge Summary  Patient ID: Deborah Hudson MRN: [id number] DOB/AGE: 1946-02-22 72 y.o.  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  She tolerated surgery well.  Consults: None  Significant Diagnostic Studies: None  Treatments: surgery: Anterolateral decompression L3-L4 using ex lift technique  Discharge Exam: Blood pressure 113/80, pulse 80, temperature 98.7 F (37.1 C), temperature source Oral, resp. rate 18, height 5\' 9"  (1.753 m), weight 83.5 kg (184 lb 1.6 oz), SpO2 98 %. Incision is clean and dry motor function is intact Station and gait are intact  Disposition: Discharge disposition: 01-Home or Self Care       Discharge Instructions    Call MD for:  redness, tenderness, or signs of infection (pain, swelling, redness, odor or green/yellow discharge around incision site)   Complete by:  As directed    Call MD for:  severe uncontrolled pain   Complete by:  As directed    Call MD for:  temperature >100.4   Complete by:  As directed    Diet - low sodium heart healthy   Complete by:  As directed    Discharge instructions   Complete by:  As directed    Okay to shower. Do not apply salves or appointments to incision. No heavy lifting with the upper extremities greater than 15 pounds. May resume driving when not requiring pain medication and patient feels comfortable with doing so.   Incentive spirometry RT   Complete by:  As directed    Increase activity slowly   Complete by:  As directed      Allergies as of 04/20/2018      Reactions   Adhesive [tape]    Rash at the site   Sulfa  Antibiotics Itching      Medication List    TAKE these medications   BOSWELLIA PO Take 3 tablets by mouth at bedtime.   ELLURA PO Take 1 tablet by mouth at bedtime.   estradiol 0.1 MG/GM vaginal cream Commonly known as:  ESTRACE VAGINAL Place 1 Applicatorful vaginally at bedtime. For two weeks then twice a week What changed:  additional instructions   GINKGO BILOBA PO Take 1 tablet by mouth daily.   HYDROcodone-acetaminophen 5-325 MG tablet Commonly known as:  NORCO/VICODIN Take 1-2 tablets by mouth every 4 (four) hours as needed for severe pain ((score 7 to 10)).   ibuprofen 200 MG tablet Commonly known as:  ADVIL,MOTRIN Take 400 mg by mouth 2 (two) times daily as needed for moderate pain.   loteprednol 0.5 % ophthalmic suspension Commonly known as:  LOTEMAX Place 1 drop into the right eye daily.   LUBRICATING EYE DROPS OP Place 1 drop into both eyes as needed (dry eyes).   methocarbamol 500 MG tablet Commonly known as:  ROBAXIN Take 1 tablet (500 mg total) by mouth every 6 (six) hours as needed for muscle spasms.   OVER THE COUNTER MEDICATION Take 1 tablet by mouth at bedtime. Mannose B otc supplement   oxybutynin 10 MG 24 hr tablet Commonly known as:  DITROPAN-XL Take 10 mg by mouth at bedtime.   SUMAtriptan 100 MG tablet Commonly known as:  IMITREX Take 1 tablet (100 mg total) by mouth every 2 (two) hours  as needed. For migraine   triamterene-hydrochlorothiazide 37.5-25 MG tablet Commonly known as:  MAXZIDE-25 Take 1 tablet by mouth daily. What changed:  when to take this   TURMERIC CURCUMIN PO Take 1 tablet by mouth at bedtime.        SignedEarleen Newport 04/20/2018, 8:59 AM

## 2018-04-20 NOTE — Discharge Instructions (Signed)

## 2018-04-20 NOTE — Progress Notes (Signed)
Pt and husband given D/C instructions with Rx's, verbal understanding was provided. Pt's incisions are clean and dry with no sign of infection. Pt's IV was removed prior to D/C. Pt D/C'd home via wheelchair @ 1020 per MD order. Pt is stable @ D/C and has no other needs at this time. Holli Humbles, RN

## 2018-05-12 ENCOUNTER — Other Ambulatory Visit (HOSPITAL_COMMUNITY): Payer: Self-pay | Admitting: Neurological Surgery

## 2018-05-12 DIAGNOSIS — M25473 Effusion, unspecified ankle: Secondary | ICD-10-CM | POA: Diagnosis not present

## 2018-05-12 DIAGNOSIS — M25476 Effusion, unspecified foot: Secondary | ICD-10-CM | POA: Diagnosis not present

## 2018-05-12 DIAGNOSIS — R52 Pain, unspecified: Secondary | ICD-10-CM

## 2018-05-12 DIAGNOSIS — M48061 Spinal stenosis, lumbar region without neurogenic claudication: Secondary | ICD-10-CM | POA: Diagnosis not present

## 2018-05-13 ENCOUNTER — Ambulatory Visit (HOSPITAL_COMMUNITY)
Admission: RE | Admit: 2018-05-13 | Discharge: 2018-05-13 | Disposition: A | Discharge status: Home / Self Care | Payer: Medicare Other | Source: Ambulatory Visit | Attending: Neurological Surgery | Admitting: Neurological Surgery

## 2018-05-13 DIAGNOSIS — R52 Pain, unspecified: Secondary | ICD-10-CM

## 2018-05-13 DIAGNOSIS — M7989 Other specified soft tissue disorders: Secondary | ICD-10-CM | POA: Diagnosis not present

## 2018-05-13 NOTE — Progress Notes (Signed)
LE venous duplex prelim: negative for DVT.  Landry Mellow, RDMS, RVT   Called results to [facility].

## 2018-06-03 DIAGNOSIS — H1851 Endothelial corneal dystrophy: Secondary | ICD-10-CM | POA: Diagnosis not present

## 2018-06-15 DIAGNOSIS — M48061 Spinal stenosis, lumbar region without neurogenic claudication: Secondary | ICD-10-CM | POA: Diagnosis not present

## 2018-07-08 ENCOUNTER — Other Ambulatory Visit: Payer: Self-pay | Admitting: Internal Medicine

## 2018-07-11 DIAGNOSIS — H04123 Dry eye syndrome of bilateral lacrimal glands: Secondary | ICD-10-CM | POA: Diagnosis not present

## 2018-07-11 DIAGNOSIS — H2512 Age-related nuclear cataract, left eye: Secondary | ICD-10-CM | POA: Diagnosis not present

## 2018-07-11 DIAGNOSIS — H1851 Endothelial corneal dystrophy: Secondary | ICD-10-CM | POA: Diagnosis not present

## 2018-07-11 DIAGNOSIS — Z961 Presence of intraocular lens: Secondary | ICD-10-CM | POA: Diagnosis not present

## 2018-07-11 DIAGNOSIS — Z947 Corneal transplant status: Secondary | ICD-10-CM | POA: Diagnosis not present

## 2018-07-30 ENCOUNTER — Encounter (HOSPITAL_COMMUNITY): Payer: Self-pay | Admitting: Emergency Medicine

## 2018-07-30 ENCOUNTER — Emergency Department (HOSPITAL_COMMUNITY)
Admission: EM | Admit: 2018-07-30 | Discharge: 2018-07-30 | Disposition: A | Discharge status: Home / Self Care | Payer: Medicare Other | Attending: Emergency Medicine | Admitting: Emergency Medicine

## 2018-07-30 DIAGNOSIS — I10 Essential (primary) hypertension: Secondary | ICD-10-CM | POA: Insufficient documentation

## 2018-07-30 DIAGNOSIS — N3000 Acute cystitis without hematuria: Secondary | ICD-10-CM | POA: Diagnosis not present

## 2018-07-30 DIAGNOSIS — Z96641 Presence of right artificial hip joint: Secondary | ICD-10-CM | POA: Diagnosis not present

## 2018-07-30 DIAGNOSIS — M791 Myalgia, unspecified site: Secondary | ICD-10-CM | POA: Diagnosis not present

## 2018-07-30 DIAGNOSIS — R5383 Other fatigue: Secondary | ICD-10-CM | POA: Diagnosis not present

## 2018-07-30 DIAGNOSIS — Z79899 Other long term (current) drug therapy: Secondary | ICD-10-CM | POA: Insufficient documentation

## 2018-07-30 DIAGNOSIS — N3001 Acute cystitis with hematuria: Secondary | ICD-10-CM | POA: Diagnosis not present

## 2018-07-30 DIAGNOSIS — R000 Tachycardia, unspecified: Secondary | ICD-10-CM | POA: Diagnosis not present

## 2018-07-30 DIAGNOSIS — R509 Fever, unspecified: Secondary | ICD-10-CM | POA: Diagnosis not present

## 2018-07-30 LAB — COMPREHENSIVE METABOLIC PANEL
ALT: 14 U/L (ref 0–44)
AST: 18 U/L (ref 15–41)
Albumin: 3.7 g/dL (ref 3.5–5.0)
Alkaline Phosphatase: 57 U/L (ref 38–126)
Anion gap: 11 (ref 5–15)
BUN: 9 mg/dL (ref 8–23)
CO2: 26 mmol/L (ref 22–32)
Calcium: 8.9 mg/dL (ref 8.9–10.3)
Chloride: 95 mmol/L — ABNORMAL LOW (ref 98–111)
Creatinine, Ser: 0.88 mg/dL (ref 0.44–1.00)
GFR calc Af Amer: >60 mL/min (ref >60)
GFR calc non Af Amer: >60 mL/min (ref >60)
Glucose, Bld: 135 mg/dL — ABNORMAL HIGH (ref 70–99)
Potassium: 3.4 mmol/L — ABNORMAL LOW (ref 3.5–5.1)
Sodium: 132 mmol/L — ABNORMAL LOW (ref 135–145)
Total Bilirubin: 0.9 mg/dL (ref 0.3–1.2)
Total Protein: 6.7 g/dL (ref 6.5–8.1)

## 2018-07-30 LAB — URINALYSIS, ROUTINE W REFLEX MICROSCOPIC
Bilirubin Urine: NEGATIVE
Glucose, UA: NEGATIVE mg/dL
Ketones, ur: NEGATIVE mg/dL
Nitrite: POSITIVE — AB
Protein, ur: NEGATIVE mg/dL
Specific Gravity, Urine: 1.008 (ref 1.005–1.030)
WBC, UA: >50 WBC/hpf — ABNORMAL HIGH (ref 0–5)
pH: 6 (ref 5.0–8.0)

## 2018-07-30 LAB — CBC WITH DIFFERENTIAL/PLATELET
Abs Immature Granulocytes: 0 10*3/uL (ref 0.0–0.1)
Basophils Absolute: 0 10*3/uL (ref 0.0–0.1)
Basophils Relative: 1 %
Eosinophils Absolute: 0 10*3/uL (ref 0.0–0.7)
Eosinophils Relative: 0 %
HCT: 41.1 % (ref 36.0–46.0)
Hemoglobin: 13.9 g/dL (ref 12.0–15.0)
Immature Granulocytes: 0 %
Lymphocytes Relative: 13 %
Lymphs Abs: 1.1 10*3/uL (ref 0.7–4.0)
MCH: 31.2 pg (ref 26.0–34.0)
MCHC: 33.8 g/dL (ref 30.0–36.0)
MCV: 92.4 fL (ref 78.0–100.0)
Monocytes Absolute: 1.3 10*3/uL — ABNORMAL HIGH (ref 0.1–1.0)
Monocytes Relative: 16 %
Neutro Abs: 6 10*3/uL (ref 1.7–7.7)
Neutrophils Relative %: 70 %
Platelets: 234 10*3/uL (ref 150–400)
RBC: 4.45 MIL/uL (ref 3.87–5.11)
RDW: 12 % (ref 11.5–15.5)
WBC: 8.5 10*3/uL (ref 4.0–10.5)

## 2018-07-30 LAB — I-STAT CG4 LACTIC ACID, ED: Lactic Acid, Venous: 1.39 mmol/L (ref 0.5–1.9)

## 2018-07-30 LAB — PROTIME-INR
INR: 1.1
Prothrombin Time: 14.1 seconds (ref 11.4–15.2)

## 2018-07-30 MED ORDER — CEFPODOXIME PROXETIL 100 MG PO TABS: 100.0000 mg | ORAL_TABLET | Freq: Two times a day (BID) | ORAL | 0 refills | Status: DC

## 2018-07-30 MED ORDER — SODIUM CHLORIDE 0.9 % IV SOLN
1.0000 g | INTRAVENOUS | Status: DC
  Administered 2018-07-30: 1 g via INTRAVENOUS
  Filled 2018-07-30: qty 10

## 2018-07-30 MED ORDER — ACETAMINOPHEN 325 MG PO TABS
650.0000 mg | ORAL_TABLET | Freq: Once | ORAL | Status: AC
  Administered 2018-07-30: 650 mg via ORAL
  Filled 2018-07-30: qty 2

## 2018-07-30 NOTE — ED Triage Notes (Signed)
Pt c/o headache, chills and body aches that began yesterday, [facility] and was told that she has severe uti with questionable kidney involvement. 102 temp at clinic, was given ibuprofen, temp now 99.6. Pt denies any frequency or painful urination. Vss. resp e/u, nad.

## 2018-07-30 NOTE — ED Notes (Signed)
Patient verbalizes understanding of discharge instructions. Opportunity for questioning and answers were provided. Armband removed by staff, pt discharged from ED ambulatory.   

## 2018-07-30 NOTE — ED Notes (Signed)
Called lab and they are adding urine culture

## 2018-07-30 NOTE — Discharge Instructions (Signed)
Contact a health care provider if: °You have back pain. °You have a fever. °You feel nauseous or vomit. °Your symptoms do not get better after 3 days. °Your symptoms go away and then return. °Get help right away if: °You have severe back pain or lower abdominal pain. °You are vomiting and cannot keep down any medicines or water. °

## 2018-07-30 NOTE — ED Provider Notes (Signed)
[facility] EMERGENCY DEPARTMENT Provider Note   CSN: [id number] Arrival date & time: 07/30/18  1118     History   Chief Complaint Chief Complaint  Patient presents with  . Fever  . Urinary Tract Infection    HPI Deborah Hudson is a 72 y.o. female who presents [facility] for evaluation of urinary tract infection.  Patient states that for the past 4 days she has had generalized malaise and body achiness.  Yesterday she began running a fever.  She presented at the [facility] today and was told she had a urinary tract infection asked to present to the emergency department to rule out pyelonephritis.  She has a past history of recurrent urinary tract infections and is followed by urology.  She denies any symptoms of UTI including dysuria urgency or hematuria.  She did feel she might have been urinating a bit more frequently at night.  She denies abdominal pain or flank pain.  She denies nausea vomiting.  She denies cough, chest pain, shortness of breath.  HPI  Past Medical History:  Diagnosis Date  . Arthritis    hips  . Back pain   . Bladder infection    frequent /with Macrodantin usage  PRN  . Hyperlipidemia    borderline  . Hypertension   . Hypoglycemia    from DECREASED PROTEIN INTAKE  . Migraine headache    migraines  . Peripheral vascular disease ([place])    varicose veins/ with injections    Patient Active Problem List   Diagnosis Date Noted  . Lumbar stenosis with neurogenic claudication 04/18/2018  . Chronic sinus infection 01/24/2018  . Lower back pain 05/24/2017  . Prediabetes 12/29/2016  . Gallstones 11/18/2015  . Essential hypertension, benign 11/18/2015  . Hypoglycemia 11/18/2015  . Urinary incontinence 11/18/2015  . Atrophy, Fuchs' 03/28/2014  . Osteoarthritis of hip 02/17/2012  . Frequent UTI 10/05/2011  . Arthritis   . Migraine headache     Past Surgical History:  Procedure Laterality Date  . ANTERIOR LAT  LUMBAR FUSION N/A 04/18/2018   Procedure: Lumbar Three and Lumbar Four anterior lateral decompression with lateral plate fixation;  Surgeon: Kristeen Miss, MD;  Location: [facility];  Service: Neurosurgery;  Laterality: N/A;  Lumbar Three and Lumbar Four anterior lateral decompression with lateral plate fixation  . BREAST SURGERY     bilateral breast lumpectomy  . CATARACT EXTRACTION Right 2011  . COLONOSCOPY    . CORNEAL TRANSPLANT Right 2011  . EYE SURGERY    . SHOULDER SURGERY Right 2015  . TOE SURGERY     right great toe scrapping for arthritis  . TOTAL HIP ARTHROPLASTY  02/17/2012   Procedure: TOTAL HIP ARTHROPLASTY;  Surgeon: Gearlean Alf, MD;  Location: [facility];  Service: Orthopedics;  Laterality: Right;     OB History   None      Home Medications    Prior to Admission medications   Medication Sig Start Date End Date Taking? Authorizing Provider  Boswellia Serrata (BOSWELLIA PO) Take 3 tablets by mouth at bedtime.    [provider]  Carboxymethylcellul-Glycerin (LUBRICATING EYE DROPS OP) Place 1 drop into both eyes as needed (dry eyes).    [provider]  cefpodoxime (VANTIN) 100 MG tablet Take 1 tablet (100 mg total) by mouth 2 (two) times daily. 07/30/18   Jimy Gates, PA-C  Cranberry (ELLURA PO) Take 1 tablet by mouth at bedtime.    [provider]  estradiol (ESTRACE VAGINAL) 0.1 MG/GM vaginal cream Place 1 Applicatorful vaginally at bedtime. For two weeks then twice a week Patient taking differently: Place 1 Applicatorful vaginally at bedtime.  12/29/16   Binnie Rail, MD  GINKGO BILOBA PO Take 1 tablet by mouth daily.    [provider]  HYDROcodone-acetaminophen (NORCO/VICODIN) 5-325 MG tablet Take 1-2 tablets by mouth every 4 (four) hours as needed for severe pain ((score 7 to 10)). 04/20/18   Kristeen Miss, MD  ibuprofen (ADVIL,MOTRIN) 200 MG tablet Take 400 mg by mouth 2 (two) times daily as needed for moderate pain.    [provider]  loteprednol (LOTEMAX) 0.5 % ophthalmic suspension Place 1 drop into the right eye daily.    [provider]  methocarbamol (ROBAXIN) 500 MG tablet Take 1 tablet (500 mg total) by mouth every 6 (six) hours as needed for muscle spasms. 04/20/18   Kristeen Miss, MD  OVER THE COUNTER MEDICATION Take 1 tablet by mouth at bedtime. Mannose B otc supplement    [provider]  oxybutynin (DITROPAN-XL) 10 MG 24 hr tablet Take 10 mg by mouth at bedtime.    [provider]  SUMAtriptan (IMITREX) 100 MG tablet Take 1 tablet (100 mg total) by mouth every 2 (two) hours as needed. For migraine 12/29/16   Binnie Rail, MD  triamterene-hydrochlorothiazide (MAXZIDE-25) 37.5-25 MG tablet TAKE 1 TABLET BY MOUTH EVERY DAY 07/08/18   Binnie Rail, MD  TURMERIC CURCUMIN PO Take 1 tablet by mouth at bedtime.    [provider]    Family History Family History  Problem Relation Age of Onset  . Cancer Mother        lung  . Alzheimer's disease Mother   . Depression Mother   . Stroke Father   . Cancer Maternal Grandfather        lung  . Stroke Maternal Grandmother     Social History Social History   Tobacco Use  . Smoking status: Never Smoker  . Smokeless tobacco: Never Used  Substance Use Topics  . Alcohol use: Yes    Alcohol/week: 2.0 standard drinks    Types: 2 Glasses of wine per week    Comment: Rare  . Drug use: No     Allergies   Adhesive [tape] and Sulfa antibiotics   Review of Systems Review of Systems  Ten systems reviewed and are negative for acute change, except as noted in the HPI.  Physical Exam Updated Vital Signs BP 116/70   Pulse 76   Temp 99.6 F (37.6 C) (Oral)   Resp 16   Ht 5\' 9"  (1.753 m)   Wt 81.6 kg   SpO2 100%   BMI 26.58 kg/m   Physical Exam  Nursing note and vitals reviewed. Constitutional: She is oriented to person, place, and time. She appears well-developed and well-nourished. No distress.   Non-toxic. HENT:  Head: Normocephalic and atraumatic.  Eyes: Conjunctivae normal and EOM are normal. Pupils are equal, round, and reactive to light. No scleral icterus.  Neck: Normal range of motion.  Cardiovascular: Normal rate, regular rhythm and normal heart sounds.  Exam reveals no gallop and no friction rub.   No murmur heard. Pulmonary/Chest: Effort normal and breath sounds normal. No respiratory distress.  Abdominal: Soft. Bowel sounds are normal. She exhibits no distension and no mass. There is no tenderness. There is no guarding. No CVA tenderness Neurological: She is alert and oriented to person, place, and time.  Skin: Skin is warm and dry. She is not diaphoretic.     ED Treatments / Results  Labs (all labs ordered are listed, but only abnormal results are displayed) Labs Reviewed  COMPREHENSIVE METABOLIC PANEL - Abnormal; Notable for the following components:      Result Value   Sodium 132 (*)    Potassium 3.4 (*)    Chloride 95 (*)    Glucose, Bld 135 (*)    All other components within normal limits  CBC WITH DIFFERENTIAL/PLATELET - Abnormal; Notable for the following components:   Monocytes Absolute 1.3 (*)    All other components within normal limits  URINALYSIS, ROUTINE W REFLEX MICROSCOPIC - Abnormal; Notable for the following components:   APPearance HAZY (*)    Hgb urine dipstick MODERATE (*)    Nitrite POSITIVE (*)    Leukocytes, UA MODERATE (*)    WBC, UA >50 (*)    Bacteria, UA RARE (*)    All other components within normal limits  CULTURE, BLOOD (ROUTINE X 2)  URINE CULTURE  PROTIME-INR  I-STAT CG4 LACTIC ACID, ED    EKG None  Radiology No results found.  Procedures Procedures (including critical care time)  Medications Ordered in ED Medications  acetaminophen (TYLENOL) tablet 650 mg (650 mg Oral Given 07/30/18 1342)     Initial Impression / Assessment and Plan / ED Course  I have reviewed the triage vital signs and the nursing  notes.  Pertinent labs & imaging results that were available during my care of the patient were reviewed by me and considered in my medical decision making (see chart for details).  Clinical Course as of Jul 30 2226  Sat Jul 30, 2018  1304 WBC: 8.5 [AH]  1335 She is complaining of a fever since yesterday and went to urgent care today where she was found to have a UTI.  Sounds like she gets frequent UTIs but does not usually get the typical symptoms anymore.  She is otherwise nontoxic appearing.  She is getting IV doses of antibiotics and will likely be discharged on oral antibiotics and follow-up with her primary care doctor and urologist.   [MB]    Clinical Course User Index [AH] Margarita Mail, PA-C [MB] Hayden Rasmussen, MD    Patient with acute cystitis.  No evidence of pyelonephritis.  Urine sent for culture.  I reviewed her microbiology reports which show previous growth of E. coli which was pansensitive to antibiotics.  She was given Rocephin here and will be discharged on Vantin.  The patient appears appropriate for discharge at this time.  I discussed return precautions. Patient seen and shared visit with Dr. Melina Copa Final Clinical Impressions(s) / ED Diagnoses   Final diagnoses:  Acute cystitis without hematuria    ED Discharge Orders         Ordered    cefpodoxime (VANTIN) 100 MG tablet  2 times daily     07/30/18 1504           Margarita Mail, PA-C 07/30/18 2228    Hayden Rasmussen, MD 07/31/18 1011

## 2018-08-01 LAB — URINE CULTURE: Culture: >=100000 — AB

## 2018-08-02 ENCOUNTER — Encounter: Payer: Self-pay | Admitting: Internal Medicine

## 2018-08-02 ENCOUNTER — Telehealth: Payer: Self-pay | Admitting: Internal Medicine

## 2018-08-02 ENCOUNTER — Ambulatory Visit (INDEPENDENT_AMBULATORY_CARE_PROVIDER_SITE_OTHER): Payer: Medicare Other | Admitting: Internal Medicine

## 2018-08-02 ENCOUNTER — Telehealth: Payer: Self-pay | Admitting: Emergency Medicine

## 2018-08-02 DIAGNOSIS — K219 Gastro-esophageal reflux disease without esophagitis: Secondary | ICD-10-CM | POA: Diagnosis not present

## 2018-08-02 DIAGNOSIS — N3000 Acute cystitis without hematuria: Secondary | ICD-10-CM

## 2018-08-02 MED ORDER — DICLOFENAC SODIUM 1 % TD GEL: TRANSDERMAL | 5 refills | Status: DC

## 2018-08-02 NOTE — Telephone Encounter (Signed)
Pt seen by Dr. Quay Burow today.

## 2018-08-02 NOTE — Patient Instructions (Addendum)
Complete the entire course of your antibiotic.   Try taking zantac 75 mg twice daily.  If your heartburn does not improve follow up.       Gastroesophageal Reflux Disease, Adult Normally, food travels down the esophagus and stays in the stomach to be digested. However, when a person has gastroesophageal reflux disease (GERD), food and stomach acid move back up into the esophagus. When this happens, the esophagus becomes sore and inflamed. Over time, GERD can create small holes (ulcers) in the lining of the esophagus. What are the causes? This condition is caused by a problem with the muscle between the esophagus and the stomach (lower esophageal sphincter, or LES). Normally, the LES muscle closes after food passes through the esophagus to the stomach. When the LES is weakened or abnormal, it does not close properly, and that allows food and stomach acid to go back up into the esophagus. The LES can be weakened by certain dietary substances, medicines, and medical conditions, including:  Tobacco use.  Pregnancy.  Having a hiatal hernia.  Heavy alcohol use.  Certain foods and beverages, such as coffee, chocolate, onions, and peppermint.  What increases the risk? This condition is more likely to develop in:  People who have an increased body weight.  People who have connective tissue disorders.  People who use NSAID medicines.  What are the signs or symptoms? Symptoms of this condition include:  Heartburn.  Difficult or painful swallowing.  The feeling of having a lump in the throat.  Abitter taste in the mouth.  Bad breath.  Having a large amount of saliva.  Having an upset or bloated stomach.  Belching.  Chest pain.  Shortness of breath or wheezing.  Ongoing (chronic) cough or a night-time cough.  Wearing away of tooth enamel.  Weight loss.  Different conditions can cause chest pain. Make sure to see your health care provider if you experience chest  pain. How is this diagnosed? Your health care provider will take a medical history and perform a physical exam. To determine if you have mild or severe GERD, your health care provider may also monitor how you respond to treatment. You may also have other tests, including:  An endoscopy toexamine your stomach and esophagus with a small camera.  A test thatmeasures the acidity level in your esophagus.  A test thatmeasures how much pressure is on your esophagus.  A barium swallow or modified barium swallow to show the shape, size, and functioning of your esophagus.  How is this treated? The goal of treatment is to help relieve your symptoms and to prevent complications. Treatment for this condition may vary depending on how severe your symptoms are. Your health care provider may recommend:  Changes to your diet.  Medicine.  Surgery.  Follow these instructions at home: Diet  Follow a diet as recommended by your health care provider. This may involve avoiding foods and drinks such as: ? Coffee and tea (with or without caffeine). ? Drinks that containalcohol. ? Energy drinks and sports drinks. ? Carbonated drinks or sodas. ? Chocolate and cocoa. ? Peppermint and mint flavorings. ? Garlic and onions. ? Horseradish. ? Spicy and acidic foods, including peppers, chili powder, curry powder, vinegar, hot sauces, and barbecue sauce. ? Citrus fruit juices and citrus fruits, such as oranges, lemons, and limes. ? Tomato-based foods, such as red sauce, chili, salsa, and pizza with red sauce. ? Fried and fatty foods, such as donuts, french fries, potato chips, and high-fat dressings. ?  High-fat meats, such as hot dogs and fatty cuts of red and white meats, such as rib eye steak, sausage, ham, and bacon. ? High-fat dairy items, such as whole milk, butter, and cream cheese.  Eat small, frequent meals instead of large meals.  Avoid drinking large amounts of liquid with your meals.  Avoid  eating meals during the 2-3 hours before bedtime.  Avoid lying down right after you eat.  Do not exercise right after you eat. General instructions  Pay attention to any changes in your symptoms.  Take over-the-counter and prescription medicines only as told by your health care provider. Do not take aspirin, ibuprofen, or other NSAIDs unless your health care provider told you to do so.  Do not use any tobacco products, including cigarettes, chewing tobacco, and e-cigarettes. If you need help quitting, ask your health care provider.  Wear loose-fitting clothing. Do not wear anything tight around your waist that causes pressure on your abdomen.  Raise (elevate) the head of your bed 6 inches (15cm).  Try to reduce your stress, such as with yoga or meditation. If you need help reducing stress, ask your health care provider.  If you are overweight, reduce your weight to an amount that is healthy for you. Ask your health care provider for guidance about a safe weight loss goal.  Keep all follow-up visits as told by your health care provider. This is important. Contact a health care provider if:  You have new symptoms.  You have unexplained weight loss.  You have difficulty swallowing, or it hurts to swallow.  You have wheezing or a persistent cough.  Your symptoms do not improve with treatment.  You have a hoarse voice. Get help right away if:  You have pain in your arms, neck, jaw, teeth, or back.  You feel sweaty, dizzy, or light-headed.  You have chest pain or shortness of breath.  You vomit and your vomit looks like blood or coffee grounds.  You faint.  Your stool is bloody or black.  You cannot swallow, drink, or eat. This information is not intended to replace advice given to you by your health care provider. Make sure you discuss any questions you have with your health care provider. Document Released: 08/26/2005 Document Revised: 04/15/2016 Document Reviewed:  03/13/2015 Elsevier Interactive Patient Education  [employer].

## 2018-08-02 NOTE — Telephone Encounter (Signed)
Patient was triaged by [facility] on 9/1 at 12:27pm.  Patient states she was treated in the ED on 8/31.  States she was diagnosised with a UTI.  States she still has a fever of 102.  States she aches all over.  States she was given antibiotics.  Patient was advised on how to treat this at home.  Patient has an appt scheduled for 9/3 at 1pm.

## 2018-08-02 NOTE — Telephone Encounter (Signed)
Post ED Visit - Positive Culture Follow-up  Culture report reviewed by antimicrobial stewardship pharmacist:  []  Elenor Quinones, Pharm.D. [x]  Heide Guile, Pharm.D., BCPS AQ-ID []  Parks Neptune, Pharm.D., BCPS []  Alycia Rossetti, Pharm.D., BCPS []  [place], [place].D., BCPS, AAHIVP []  Legrand Como, Pharm.D., BCPS, AAHIVP []  Salome Arnt, PharmD, BCPS []  Johnnette Gourd, PharmD, BCPS []  Hughes Better, PharmD, BCPS []  Leeroy Cha, PharmD  Positive urine culture Treated with cefpodoxime, organism sensitive to the same and no further patient follow-up is required at this time.  Hazle Nordmann 08/02/2018, 11:27 AM

## 2018-08-02 NOTE — Progress Notes (Signed)
Subjective:    Patient ID: Deborah Hudson, female    DOB: 05/10/1946, 72 y.o.   MRN: [id number]  HPI The patient is here for follow up from the hospital.  07/30/2018: She went to urgent care for 4 days of fatigue and myalgias.  She also stated fever, chills and headaches that started 1 day prior.  She denied any urinary tract symptoms, but did want to be checked for a UTI.  She knew that she could have a UTI without having typical symptoms.  She had taken aspirin the day prior and it did help with some of her muscle aches, but not her headache.  She was diagnosed with acute cystitis with hematuria.  She was referred to the emergency room to rule out pyelonephritis.  She does have a history of urinary tract infections and follows with urology.  She denied abdominal pain, flank pain, nausea, dysuria, hematuria, urinary urgency, chest pain and shortness of breath.  She states there may have been slight increase in urinary frequency at night.  She had no abdominal pain on exam.  She had a low-grade fever, but her other vital signs were all within normal limits.  Urinalysis confirms UTI.  WBC was 8.5.  There is no evidence of pyelonephritis.  Urine was sent for culture.  She was given a dose of Rocephin and discharged home on Vantin.  The urine culture results showed Klebsiella pneumoniae that was sensitive to all cephalosporins.  Blood cultures were negative.  She is taking the antibiotic as prescribed.  She denies any urinary frequency, urinary urgency, dysuria or hematuria.  She denies any fevers or chills.  Her myalgias have resolved.  Burning in esophagus for one month:  She has been taking 99.6% aloe vera 2-4 oz a day.  It is still burning in her upper esophagus.  She did not think it was GERD because it was only in the upper throat.  For two weeks she took a 24 hr antacid - zantac, but did not take it consistently.  She takes advil about twice a day for headaches or back pain since her surgery.       Medications and allergies reviewed with patient and updated if appropriate.  Patient Active Problem List   Diagnosis Date Noted  . Acute cystitis 08/02/2018  . Lumbar stenosis with neurogenic claudication 04/18/2018  . Chronic sinus infection 01/24/2018  . Lower back pain 05/24/2017  . Prediabetes 12/29/2016  . Gallstones 11/18/2015  . Essential hypertension, benign 11/18/2015  . Hypoglycemia 11/18/2015  . Urinary incontinence 11/18/2015  . Atrophy, Fuchs' 03/28/2014  . Osteoarthritis of hip 02/17/2012  . Frequent UTI 10/05/2011  . Arthritis   . Migraine headache     Current Outpatient Medications on File Prior to Visit  Medication Sig Dispense Refill  . Boswellia Serrata (BOSWELLIA PO) Take 3 tablets by mouth at bedtime.    . Carboxymethylcellul-Glycerin (LUBRICATING EYE DROPS OP) Place 1 drop into both eyes as needed (dry eyes).    . cefpodoxime (VANTIN) 100 MG tablet Take 1 tablet (100 mg total) by mouth 2 (two) times daily. 14 tablet 0  . Cranberry (ELLURA PO) Take 1 tablet by mouth at bedtime.    . diclofenac sodium (VOLTAREN) 1 % GEL Apply topically 4 (four) times daily.    Marland Kitchen estradiol (ESTRACE VAGINAL) 0.1 MG/GM vaginal cream Place 1 Applicatorful vaginally at bedtime. For two weeks then twice a week (Patient taking differently: Place 1 Applicatorful vaginally at bedtime. ) 42.5 g  12  . GINKGO BILOBA PO Take 1 tablet by mouth daily.    Marland Kitchen HYDROcodone-acetaminophen (NORCO/VICODIN) 5-325 MG tablet Take 1-2 tablets by mouth every 4 (four) hours as needed for severe pain ((score 7 to 10)). 60 tablet 0  . ibuprofen (ADVIL,MOTRIN) 200 MG tablet Take 400 mg by mouth 2 (two) times daily as needed for moderate pain.    Marland Kitchen loteprednol (LOTEMAX) 0.5 % ophthalmic suspension Place 1 drop into the right eye daily.    Marland Kitchen OVER THE COUNTER MEDICATION Take 1 tablet by mouth at bedtime. Mannose B otc supplement    . oxybutynin (DITROPAN-XL) 10 MG 24 hr tablet Take 10 mg by mouth at bedtime.     . SUMAtriptan (IMITREX) 100 MG tablet Take 1 tablet (100 mg total) by mouth every 2 (two) hours as needed. For migraine 10 tablet 5  . triamterene-hydrochlorothiazide (MAXZIDE-25) 37.5-25 MG tablet TAKE 1 TABLET BY MOUTH EVERY DAY 90 tablet 1  . TURMERIC CURCUMIN PO Take 1 tablet by mouth at bedtime.     No current facility-administered medications on file prior to visit.     Past Medical History:  Diagnosis Date  . Arthritis    hips  . Back pain   . Bladder infection    frequent /with Macrodantin usage  PRN  . Hyperlipidemia    borderline  . Hypertension   . Hypoglycemia    from DECREASED PROTEIN INTAKE  . Migraine headache    migraines  . Peripheral vascular disease ([place])    varicose veins/ with injections    Past Surgical History:  Procedure Laterality Date  . ANTERIOR LAT LUMBAR FUSION N/A 04/18/2018   Procedure: Lumbar Three and Lumbar Four anterior lateral decompression with lateral plate fixation;  Surgeon: Kristeen Miss, MD;  Location: [place];  Service: Neurosurgery;  Laterality: N/A;  Lumbar Three and Lumbar Four anterior lateral decompression with lateral plate fixation  . BREAST SURGERY     bilateral breast lumpectomy  . CATARACT EXTRACTION Right 2011  . COLONOSCOPY    . CORNEAL TRANSPLANT Right 2011  . EYE SURGERY    . SHOULDER SURGERY Right 2015  . TOE SURGERY     right great toe scrapping for arthritis  . TOTAL HIP ARTHROPLASTY  02/17/2012   Procedure: TOTAL HIP ARTHROPLASTY;  Surgeon: Gearlean Alf, MD;  Location: [facility];  Service: Orthopedics;  Laterality: Right;    Social History   Socioeconomic History  . Marital status: Married    Spouse name: Not on file  . Number of children: 2  . Years of education: Not on file  . Highest education level: Not on file  Occupational History  . Not on file  Social Needs  . Financial resource strain: Not hard at all  . Food insecurity:    Worry: Never true    Inability: Never true  . Transportation needs:     Medical: No    Non-medical: No  Tobacco Use  . Smoking status: Never Smoker  . Smokeless tobacco: Never Used  Substance and Sexual Activity  . Alcohol use: Yes    Alcohol/week: 2.0 standard drinks    Types: 2 Glasses of wine per week    Comment: Rare  . Drug use: No  . Sexual activity: Yes  Lifestyle  . Physical activity:    Days per week: 3 days    Minutes per session: 30 min  . Stress: Not at all  Relationships  . Social connections:    Talks  on phone: More than three times a week    Gets together: More than three times a week    Attends religious service: More than 4 times per year    Active member of club or organization: Yes    Attends meetings of clubs or organizations: More than 4 times per year    Relationship status: Married  Other Topics Concern  . Not on file  Social History Narrative  . Not on file    Family History  Problem Relation Age of Onset  . Cancer Mother        lung  . Alzheimer's disease Mother   . Depression Mother   . Stroke Father   . Cancer Maternal Grandfather        lung  . Stroke Maternal Grandmother     Review of Systems  Constitutional: Negative for chills and fever.  HENT: Negative for sore throat and trouble swallowing.   Gastrointestinal: Negative for abdominal pain and nausea.       Gerd  Genitourinary: Negative for dysuria, frequency, hematuria and urgency.  Neurological: Positive for headaches (occasional).       Objective:   Vitals:   08/02/18 1302  BP: 122/78  Pulse: 67  Resp: 16  Temp: 97.7 F (36.5 C)  SpO2: 98%   BP Readings from Last 3 Encounters:  08/02/18 122/78  07/30/18 116/70  04/20/18 113/80   Wt Readings from Last 3 Encounters:  08/02/18 184 lb 6.4 oz (83.6 kg)  07/30/18 180 lb (81.6 kg)  04/18/18 184 lb 1.6 oz (83.5 kg)   Body mass index is 27.23 kg/m.   Physical Exam  Constitutional: She appears well-developed and well-nourished. No distress.  HENT:  Head: Normocephalic and atraumatic.   Abdominal: Soft. She exhibits no distension. There is no tenderness. There is no rebound and no guarding.  Genitourinary:  Genitourinary Comments: No cva tenderness  Musculoskeletal: She exhibits edema (mild foot and ankle).  Skin: She is not diaphoretic.        Lab Results  Component Value Date   WBC 8.5 07/30/2018   HGB 13.9 07/30/2018   HCT 41.1 07/30/2018   PLT 234 07/30/2018   GLUCOSE 135 (H) 07/30/2018   CHOL 166 01/07/2017   TRIG 79.0 01/07/2017   HDL 41.90 01/07/2017   LDLCALC 109 (H) 01/07/2017   ALT 14 07/30/2018   AST 18 07/30/2018   NA 132 (L) 07/30/2018   K 3.4 (L) 07/30/2018   CL 95 (L) 07/30/2018   CREATININE 0.88 07/30/2018   BUN 9 07/30/2018   CO2 26 07/30/2018   TSH 1.74 01/07/2017   INR 1.10 07/30/2018   HGBA1C 5.5 01/24/2018    Urinalysis    Component Value Date/Time   COLORURINE YELLOW 07/30/2018 1220   APPEARANCEUR HAZY (A) 07/30/2018 1220   LABSPEC 1.008 07/30/2018 1220   PHURINE 6.0 07/30/2018 1220   GLUCOSEU NEGATIVE 07/30/2018 1220   GLUCOSEU NEGATIVE 05/18/2017 1406   HGBUR MODERATE (A) 07/30/2018 1220   BILIRUBINUR NEGATIVE 07/30/2018 1220   KETONESUR NEGATIVE 07/30/2018 1220   PROTEINUR NEGATIVE 07/30/2018 1220   UROBILINOGEN 0.2 05/18/2017 1406   NITRITE POSITIVE (A) 07/30/2018 1220   LEUKOCYTESUR MODERATE (A) 07/30/2018 1220      Assessment & Plan:    See Problem List for Assessment and Plan of chronic medical problems.

## 2018-08-02 NOTE — Assessment & Plan Note (Addendum)
Treated in the ED-had Rocephin and then discharged home on Vantin Has a history of recurrent urinary tract infections and follows up with urology Complete Vantin Urine cups given - if she feels like she has an infection she will bring in a sample

## 2018-08-02 NOTE — Assessment & Plan Note (Signed)
Burning in upper esophagus c/w GERD She is taking advil regularly which is likely the cause Start otc zantac 75 mg BID Follow up in 1 month if no improvement

## 2018-08-04 ENCOUNTER — Telehealth: Payer: Self-pay

## 2018-08-04 LAB — CULTURE, BLOOD (ROUTINE X 2)
Culture: NO GROWTH
Special Requests: ADEQUATE

## 2018-08-04 NOTE — Telephone Encounter (Signed)
PA for diclofenac sodium initiated.   IFX:[id number]

## 2018-08-10 ENCOUNTER — Other Ambulatory Visit (INDEPENDENT_AMBULATORY_CARE_PROVIDER_SITE_OTHER): Payer: Medicare Other

## 2018-08-10 ENCOUNTER — Other Ambulatory Visit: Payer: Self-pay

## 2018-08-10 DIAGNOSIS — N3001 Acute cystitis with hematuria: Secondary | ICD-10-CM | POA: Diagnosis not present

## 2018-08-10 LAB — URINALYSIS
Bilirubin Urine: NEGATIVE
Hgb urine dipstick: NEGATIVE
Ketones, ur: NEGATIVE
Leukocytes, UA: NEGATIVE
Nitrite: NEGATIVE
Specific Gravity, Urine: <=1.005 — AB (ref 1.000–1.030)
Total Protein, Urine: NEGATIVE
Urine Glucose: NEGATIVE
Urobilinogen, UA: 0.2 (ref 0.0–1.0)
pH: 7 (ref 5.0–8.0)

## 2018-08-11 LAB — URINE CULTURE
MICRO NUMBER:: 91087938
SPECIMEN QUALITY:: ADEQUATE

## 2018-08-18 NOTE — Telephone Encounter (Signed)
PA for diclofenac has been approved. Pharmacy is aware.

## 2018-09-13 NOTE — Progress Notes (Signed)
Subjective:    Patient ID: Deborah Hudson, female    DOB: [birth year], 72 y.o.   MRN: [id number]  HPI The patient is here for follow up.  GERD:  She was here one month ago and was taking advil regularly.  I advised taking Zantac or omeprazole, which she did start over-the-counter omeprazole.  She can took it daily for 2 weeks.  She states after that she has not had any reflux symptoms, but she still has a sensation in her esophagus-it feels irritated.  She denies any true burning sensation or difficulty swallowing.  For the past 3 days she is also taking generic Benadryl for postnasal drip because we did discuss that that could also be causing some of her symptoms.  She wondered if she should continue that or take something different.     Medications and allergies reviewed with patient and updated if appropriate.  Patient Active Problem List   Diagnosis Date Noted  . Acute cystitis 08/02/2018  . GERD (gastroesophageal reflux disease) 08/02/2018  . Lumbar stenosis with neurogenic claudication 04/18/2018  . Chronic sinus infection 01/24/2018  . Lower back pain 05/24/2017  . Prediabetes 12/29/2016  . Gallstones 11/18/2015  . Essential hypertension, benign 11/18/2015  . Hypoglycemia 11/18/2015  . Urinary incontinence 11/18/2015  . Atrophy, Fuchs' 03/28/2014  . Osteoarthritis of hip 02/17/2012  . Frequent UTI 10/05/2011  . Arthritis   . Migraine headache     Current Outpatient Medications on File Prior to Visit  Medication Sig Dispense Refill  . Boswellia Serrata (BOSWELLIA PO) Take 3 tablets by mouth at bedtime.    . Carboxymethylcellul-Glycerin (LUBRICATING EYE DROPS OP) Place 1 drop into both eyes as needed (dry eyes).    . cefpodoxime (VANTIN) 100 MG tablet Take 1 tablet (100 mg total) by mouth 2 (two) times daily. 14 tablet 0  . Cranberry (ELLURA PO) Take 1 tablet by mouth at bedtime.    . diclofenac sodium (VOLTAREN) 1 % GEL Apply topically 4 (four) times daily. 100 g 5  .  estradiol (ESTRACE VAGINAL) 0.1 MG/GM vaginal cream Place 1 Applicatorful vaginally at bedtime. For two weeks then twice a week (Patient taking differently: Place 1 Applicatorful vaginally at bedtime. ) 42.5 g 12  . GINKGO BILOBA PO Take 1 tablet by mouth daily.    Marland Kitchen HYDROcodone-acetaminophen (NORCO/VICODIN) 5-325 MG tablet Take 1-2 tablets by mouth every 4 (four) hours as needed for severe pain ((score 7 to 10)). 60 tablet 0  . ibuprofen (ADVIL,MOTRIN) 200 MG tablet Take 400 mg by mouth 2 (two) times daily as needed for moderate pain.    Marland Kitchen loteprednol (LOTEMAX) 0.5 % ophthalmic suspension Place 1 drop into the right eye daily.    Marland Kitchen OVER THE COUNTER MEDICATION Take 1 tablet by mouth at bedtime. Mannose B otc supplement    . oxybutynin (DITROPAN-XL) 10 MG 24 hr tablet Take 10 mg by mouth at bedtime.    . SUMAtriptan (IMITREX) 100 MG tablet Take 1 tablet (100 mg total) by mouth every 2 (two) hours as needed. For migraine 10 tablet 5  . triamterene-hydrochlorothiazide (MAXZIDE-25) 37.5-25 MG tablet TAKE 1 TABLET BY MOUTH EVERY DAY 90 tablet 1  . TURMERIC CURCUMIN PO Take 1 tablet by mouth at bedtime.     No current facility-administered medications on file prior to visit.     Past Medical History:  Diagnosis Date  . Arthritis    hips  . Back pain   . Bladder infection  frequent /with Macrodantin usage  PRN  . Hyperlipidemia    borderline  . Hypertension   . Hypoglycemia    from DECREASED PROTEIN INTAKE  . Migraine headache    migraines  . Peripheral vascular disease ([place])    varicose veins/ with injections    Past Surgical History:  Procedure Laterality Date  . ANTERIOR LAT LUMBAR FUSION N/A 04/18/2018   Procedure: Lumbar Three and Lumbar Four anterior lateral decompression with lateral plate fixation;  Surgeon: Kristeen Miss, MD;  Location: [place];  Service: Neurosurgery;  Laterality: N/A;  Lumbar Three and Lumbar Four anterior lateral decompression with lateral plate fixation  .  BREAST SURGERY     bilateral breast lumpectomy  . CATARACT EXTRACTION Right 2011  . COLONOSCOPY    . CORNEAL TRANSPLANT Right 2011  . EYE SURGERY    . SHOULDER SURGERY Right 2015  . TOE SURGERY     right great toe scrapping for arthritis  . TOTAL HIP ARTHROPLASTY  02/17/2012   Procedure: TOTAL HIP ARTHROPLASTY;  Surgeon: Gearlean Alf, MD;  Location: [facility];  Service: Orthopedics;  Laterality: Right;    Social History   Socioeconomic History  . Marital status: Married    Spouse name: Not on file  . Number of children: 2  . Years of education: Not on file  . Highest education level: Not on file  Occupational History  . Not on file  Social Needs  . Financial resource strain: Not hard at all  . Food insecurity:    Worry: Never true    Inability: Never true  . Transportation needs:    Medical: No    Non-medical: No  Tobacco Use  . Smoking status: Never Smoker  . Smokeless tobacco: Never Used  Substance and Sexual Activity  . Alcohol use: Yes    Alcohol/week: 2.0 standard drinks    Types: 2 Glasses of wine per week    Comment: Rare  . Drug use: No  . Sexual activity: Yes  Lifestyle  . Physical activity:    Days per week: 3 days    Minutes per session: 30 min  . Stress: Not at all  Relationships  . Social connections:    Talks on phone: More than three times a week    Gets together: More than three times a week    Attends religious service: More than 4 times per year    Active member of club or organization: Yes    Attends meetings of clubs or organizations: More than 4 times per year    Relationship status: Married  Other Topics Concern  . Not on file  Social History Narrative  . Not on file    Family History  Problem Relation Age of Onset  . Cancer Mother        lung  . Alzheimer's disease Mother   . Depression Mother   . Stroke Father   . Cancer Maternal Grandfather        lung  . Stroke Maternal Grandmother     Review of Systems  HENT: Negative  for sore throat, trouble swallowing and voice change.   Respiratory: Negative for cough, shortness of breath and wheezing.   Gastrointestinal: Negative for abdominal pain and nausea.       Objective:   Vitals:   09/14/18 1315  BP: 122/76  Pulse: 84  Resp: 16  Temp: 98.6 F (37 C)  SpO2: 96%   BP Readings from Last 3 Encounters:  09/14/18  122/76  08/02/18 122/78  07/30/18 116/70   Wt Readings from Last 3 Encounters:  09/14/18 184 lb (83.5 kg)  08/02/18 184 lb 6.4 oz (83.6 kg)  07/30/18 180 lb (81.6 kg)   Body mass index is 27.17 kg/m.   Physical Exam    Constitutional: Appears well-developed and well-nourished. No distress.       Assessment & Plan:    15 minutes were spent face-to-face with the patient, over 50% of which was spent counseling regarding GERD including causes, diet and treatment.    See Problem List for Assessment and Plan of chronic medical problems.

## 2018-09-14 ENCOUNTER — Ambulatory Visit (INDEPENDENT_AMBULATORY_CARE_PROVIDER_SITE_OTHER): Payer: Medicare Other | Admitting: Internal Medicine

## 2018-09-14 ENCOUNTER — Encounter: Payer: Self-pay | Admitting: Internal Medicine

## 2018-09-14 VITALS — BP 122/76 | HR 84 | Temp 98.6°F | Resp 16 | Ht 69.0 in | Wt 184.0 lb

## 2018-09-14 DIAGNOSIS — R0982 Postnasal drip: Secondary | ICD-10-CM

## 2018-09-14 DIAGNOSIS — K219 Gastro-esophageal reflux disease without esophagitis: Secondary | ICD-10-CM | POA: Diagnosis not present

## 2018-09-14 NOTE — Assessment & Plan Note (Signed)
We spent most of the visit discussing heartburn symptoms, causes of heartburn, dietary causes and treatment options I believe her symptoms are consistent with reflux Discussed that she needs to take the omeprazole daily but may need to take it a couple of months her symptoms completely resolve.  Once they resolve she should taper off medication slowly Revise diet and lifestyle If her symptoms do not resolve or improve and she will need to see GI

## 2018-09-14 NOTE — Assessment & Plan Note (Signed)
May be contributing to some of her symptoms Advised a trial of an antihistamine-Claritin or Zyrtec daily to see if that helps

## 2018-09-14 NOTE — Patient Instructions (Addendum)
Take claritin or zyrtec daily for the postnasal drip.    Take the generic omeprazole for 2 months.  If you are still having symptoms call me.  If your symptoms go away taper off the medication.     Gastroesophageal Reflux Disease, Adult Normally, food travels down the esophagus and stays in the stomach to be digested. However, when a person has gastroesophageal reflux disease (GERD), food and stomach acid move back up into the esophagus. When this happens, the esophagus becomes sore and inflamed. Over time, GERD can create small holes (ulcers) in the lining of the esophagus. What are the causes? This condition is caused by a problem with the muscle between the esophagus and the stomach (lower esophageal sphincter, or LES). Normally, the LES muscle closes after food passes through the esophagus to the stomach. When the LES is weakened or abnormal, it does not close properly, and that allows food and stomach acid to go back up into the esophagus. The LES can be weakened by certain dietary substances, medicines, and medical conditions, including:  Tobacco use.  Pregnancy.  Having a hiatal hernia.  Heavy alcohol use.  Certain foods and beverages, such as coffee, chocolate, onions, and peppermint.  What increases the risk? This condition is more likely to develop in:  People who have an increased body weight.  People who have connective tissue disorders.  People who use NSAID medicines.  What are the signs or symptoms? Symptoms of this condition include:  Heartburn.  Difficult or painful swallowing.  The feeling of having a lump in the throat.  Abitter taste in the mouth.  Bad breath.  Having a large amount of saliva.  Having an upset or bloated stomach.  Belching.  Chest pain.  Shortness of breath or wheezing.  Ongoing (chronic) cough or a night-time cough.  Wearing away of tooth enamel.  Weight loss.  Different conditions can cause chest pain. Make sure to  see your health care provider if you experience chest pain. How is this diagnosed? Your health care provider will take a medical history and perform a physical exam. To determine if you have mild or severe GERD, your health care provider may also monitor how you respond to treatment. You may also have other tests, including:  An endoscopy toexamine your stomach and esophagus with a small camera.  A test thatmeasures the acidity level in your esophagus.  A test thatmeasures how much pressure is on your esophagus.  A barium swallow or modified barium swallow to show the shape, size, and functioning of your esophagus.  How is this treated? The goal of treatment is to help relieve your symptoms and to prevent complications. Treatment for this condition may vary depending on how severe your symptoms are. Your health care provider may recommend:  Changes to your diet.  Medicine.  Surgery.  Follow these instructions at home: Diet  Follow a diet as recommended by your health care provider. This may involve avoiding foods and drinks such as: ? Coffee and tea (with or without caffeine). ? Drinks that containalcohol. ? Energy drinks and sports drinks. ? Carbonated drinks or sodas. ? Chocolate and cocoa. ? Peppermint and mint flavorings. ? Garlic and onions. ? Horseradish. ? Spicy and acidic foods, including peppers, chili powder, curry powder, vinegar, hot sauces, and barbecue sauce. ? Citrus fruit juices and citrus fruits, such as oranges, lemons, and limes. ? Tomato-based foods, such as red sauce, chili, salsa, and pizza with red sauce. ? Fried and fatty foods,  such as donuts, french fries, potato chips, and high-fat dressings. ? High-fat meats, such as hot dogs and fatty cuts of red and white meats, such as rib eye steak, sausage, ham, and bacon. ? High-fat dairy items, such as whole milk, butter, and cream cheese.  Eat small, frequent meals instead of large meals.  Avoid  drinking large amounts of liquid with your meals.  Avoid eating meals during the 2-3 hours before bedtime.  Avoid lying down right after you eat.  Do not exercise right after you eat. General instructions  Pay attention to any changes in your symptoms.  Take over-the-counter and prescription medicines only as told by your health care provider. Do not take aspirin, ibuprofen, or other NSAIDs unless your health care provider told you to do so.  Do not use any tobacco products, including cigarettes, chewing tobacco, and e-cigarettes. If you need help quitting, ask your health care provider.  Wear loose-fitting clothing. Do not wear anything tight around your waist that causes pressure on your abdomen.  Raise (elevate) the head of your bed 6 inches (15cm).  Try to reduce your stress, such as with yoga or meditation. If you need help reducing stress, ask your health care provider.  If you are overweight, reduce your weight to an amount that is healthy for you. Ask your health care provider for guidance about a safe weight loss goal.  Keep all follow-up visits as told by your health care provider. This is important. Contact a health care provider if:  You have new symptoms.  You have unexplained weight loss.  You have difficulty swallowing, or it hurts to swallow.  You have wheezing or a persistent cough.  Your symptoms do not improve with treatment.  You have a hoarse voice. Get help right away if:  You have pain in your arms, neck, jaw, teeth, or back.  You feel sweaty, dizzy, or light-headed.  You have chest pain or shortness of breath.  You vomit and your vomit looks like blood or coffee grounds.  You faint.  Your stool is bloody or black.  You cannot swallow, drink, or eat. This information is not intended to replace advice given to you by your health care provider. Make sure you discuss any questions you have with your health care provider. Document Released:  08/26/2005 Document Revised: 04/15/2016 Document Reviewed: 03/13/2015 Elsevier Interactive Patient Education  [employer].

## 2018-09-25 DIAGNOSIS — Z23 Encounter for immunization: Secondary | ICD-10-CM | POA: Diagnosis not present

## 2018-09-27 ENCOUNTER — Telehealth: Payer: Self-pay

## 2018-09-27 NOTE — Telephone Encounter (Signed)
Pts husband is aware that tdap is up to date and that she will be due for another one next year in April.
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
## 2018-10-21 DIAGNOSIS — D2271 Melanocytic nevi of right lower limb, including hip: Secondary | ICD-10-CM | POA: Diagnosis not present

## 2018-10-21 DIAGNOSIS — D225 Melanocytic nevi of trunk: Secondary | ICD-10-CM | POA: Diagnosis not present

## 2018-10-21 DIAGNOSIS — D2272 Melanocytic nevi of left lower limb, including hip: Secondary | ICD-10-CM | POA: Diagnosis not present

## 2018-10-21 DIAGNOSIS — L720 Epidermal cyst: Secondary | ICD-10-CM | POA: Diagnosis not present

## 2018-10-21 DIAGNOSIS — L821 Other seborrheic keratosis: Secondary | ICD-10-CM | POA: Diagnosis not present

## 2018-10-21 DIAGNOSIS — L820 Inflamed seborrheic keratosis: Secondary | ICD-10-CM | POA: Diagnosis not present

## 2018-10-21 DIAGNOSIS — L812 Freckles: Secondary | ICD-10-CM | POA: Diagnosis not present

## 2018-10-25 DIAGNOSIS — H40023 Open angle with borderline findings, high risk, bilateral: Secondary | ICD-10-CM | POA: Diagnosis not present

## 2018-11-15 DIAGNOSIS — H40023 Open angle with borderline findings, high risk, bilateral: Secondary | ICD-10-CM | POA: Diagnosis not present

## 2018-11-25 ENCOUNTER — Telehealth: Payer: Self-pay

## 2018-11-25 DIAGNOSIS — K219 Gastro-esophageal reflux disease without esophagitis: Secondary | ICD-10-CM

## 2018-11-25 NOTE — Telephone Encounter (Signed)
What dose is she taking? Do not see on med list. If she is taking less than 40 mg daily ask her to increase to 40 mg daily before breakfast and call back in 1-2 weeks if no better. If already taking 40 mg daily ask her to get pepcid over the counter and add this in the evening and continue omeprazole. If no improvement in 1-2 weeks call back.

## 2018-11-25 NOTE — Telephone Encounter (Signed)
Please advise per Dr. Quay Burow absence. Thank you

## 2018-11-25 NOTE — Telephone Encounter (Signed)
Patient informed of response below, patient states that she will take as needed till Dr. Quay Burow gets back to the office. Wanted message routed to PCP

## 2018-11-25 NOTE — Telephone Encounter (Signed)
Copied from [place] [id number]. Topic: General - Other >> Nov 25, 2018 11:39 AM Carolyn Stare wrote:  Pt call to say that she was told by DR Quay Burow if she did not get better while taking the omeprazole she would refer her to a GI doctor and she is requesting that referral

## 2018-11-27 NOTE — Addendum Note (Signed)
Addended by: Binnie Rail on: 11/27/2018 01:54 PM   Modules accepted: Orders

## 2018-11-27 NOTE — Telephone Encounter (Signed)
I agree with the advise below.  I will order a GI referral but recommend she continue the medication until she sees GI

## 2018-11-28 NOTE — Telephone Encounter (Signed)
Pt aware of response below and expressed understanding.  

## 2018-12-08 ENCOUNTER — Encounter: Payer: Self-pay | Admitting: Gastroenterology

## 2018-12-22 ENCOUNTER — Other Ambulatory Visit: Payer: Medicare Other

## 2018-12-22 ENCOUNTER — Telehealth: Payer: Self-pay

## 2018-12-22 ENCOUNTER — Ambulatory Visit (INDEPENDENT_AMBULATORY_CARE_PROVIDER_SITE_OTHER): Payer: Medicare Other | Admitting: Family Medicine

## 2018-12-22 ENCOUNTER — Encounter: Payer: Self-pay | Admitting: Family Medicine

## 2018-12-22 VITALS — BP 118/76 | HR 68 | Temp 97.9°F | Ht 69.0 in | Wt 192.1 lb

## 2018-12-22 DIAGNOSIS — R300 Dysuria: Secondary | ICD-10-CM

## 2018-12-22 LAB — POC URINALSYSI DIPSTICK (AUTOMATED)
Bilirubin, UA: NEGATIVE
Blood, UA: NEGATIVE
Glucose, UA: NEGATIVE
Ketones, UA: NEGATIVE
Leukocytes, UA: NEGATIVE
Nitrite, UA: NEGATIVE
Protein, UA: NEGATIVE
Spec Grav, UA: 1.01 (ref 1.010–1.025)
Urobilinogen, UA: 0.2 E.U./dL
pH, UA: 6 (ref 5.0–8.0)

## 2018-12-22 NOTE — Telephone Encounter (Signed)
Copied from [place] [id number]. Topic: General - Other >> Dec 22, 2018  8:17 AM Yvette Rack wrote: Reason for CRM: pt calling wanting to know if she can drop off a urine sample pt symptoms are lower back pain please cal her today at [phone]

## 2018-12-22 NOTE — Telephone Encounter (Signed)
Pt needs an appointment

## 2018-12-22 NOTE — Progress Notes (Signed)
Patient ID: Deborah Hudson, female   DOB: 1946-07-29, 73 y.o.   MRN: [id number]    PCP: Binnie Rail, MD  Subjective:  Deborah Hudson is a 73 y.o. year old very pleasant female patient who presents with Urinary Tract symptoms: symptoms including dysuria and urgency. She reports that she used test strips that she [place] from [place] and she states that her strip indicated an infection. -started: 2 days ago  , symptoms are not worsening  -previous treatments: Cephalexin 500 mg BID x 5 days after she reviewed her home strip that she had from her urologist previously.  She takes oxybutynin daily.   ROS-denies fever, chills, sweats, N/V, flank pain, or blood in urine  Pertinent Past Medical History- HTN, Frequent UTI  Medications- reviewed  Current Outpatient Medications  Medication Sig Dispense Refill  . Boswellia Serrata (BOSWELLIA PO) Take 3 tablets by mouth at bedtime.    . Carboxymethylcellul-Glycerin (LUBRICATING EYE DROPS OP) Place 1 drop into both eyes as needed (dry eyes).    . cefpodoxime (VANTIN) 100 MG tablet Take 1 tablet (100 mg total) by mouth 2 (two) times daily. 14 tablet 0  . Cranberry (ELLURA PO) Take 1 tablet by mouth at bedtime.    . diclofenac sodium (VOLTAREN) 1 % GEL Apply topically 4 (four) times daily. 100 g 5  . estradiol (ESTRACE VAGINAL) 0.1 MG/GM vaginal cream Place 1 Applicatorful vaginally at bedtime. For two weeks then twice a week (Patient taking differently: Place 1 Applicatorful vaginally at bedtime. ) 42.5 g 12  . GINKGO BILOBA PO Take 1 tablet by mouth daily.    Marland Kitchen ibuprofen (ADVIL,MOTRIN) 200 MG tablet Take 400 mg by mouth 2 (two) times daily as needed for moderate pain.    Marland Kitchen loteprednol (LOTEMAX) 0.5 % ophthalmic suspension Place 1 drop into the right eye daily.    Marland Kitchen OVER THE COUNTER MEDICATION Take 1 tablet by mouth at bedtime. Mannose B otc supplement    . oxybutynin (DITROPAN-XL) 10 MG 24 hr tablet Take 10 mg by mouth at bedtime.    . SUMAtriptan  (IMITREX) 100 MG tablet Take 1 tablet (100 mg total) by mouth every 2 (two) hours as needed. For migraine 10 tablet 5  . triamterene-hydrochlorothiazide (MAXZIDE-25) 37.5-25 MG tablet TAKE 1 TABLET BY MOUTH EVERY DAY 90 tablet 1  . TURMERIC CURCUMIN PO Take 1 tablet by mouth at bedtime.    Marland Kitchen HYDROcodone-acetaminophen (NORCO/VICODIN) 5-325 MG tablet Take 1-2 tablets by mouth every 4 (four) hours as needed for severe pain ((score 7 to 10)). (Patient not taking: Reported on 12/22/2018) 60 tablet 0   No current facility-administered medications for this visit.     Objective: BP 118/76 (BP Location: Left Arm, Patient Position: Sitting, Cuff Size: Normal)   Pulse 68   Temp 97.9 F (36.6 C) (Oral)   Ht 5\' 9"  (1.753 m)   Wt 192 lb 1.9 oz (87.1 kg)   SpO2 97%   BMI 28.37 kg/m  Gen: NAD, resting comfortably HEENT: oropharynx is clear and moist CV: RRR no murmurs rubs or gallops Lungs: CTAB no crackles, wheeze, rhonchi Abdomen: soft/nontender/nondistended/normal bowel sounds. No rebound or guarding.  No CVA tenderness.   Suprapubic tenderness not present Ext: no edema Skin: warm, dry, no rash Neuro: grossly normal, moves all extremities  Assessment/Plan:  Dysuria UA is negative for Leukocytes, nitrites, and blood. Exam is reassuring today. With history of frequent UTIs, will send for culture today. Advised her to follow up with Dr. Sharlet Salina for  symptoms and urine can be checked in the office if symptoms are noted.    Advised patient to follow up if her symptoms do not improve in 2 to 3 days, worsen, she develops a fever >101, or back pain. Also advised her to increase water intake. Patient voiced understanding and agreed with plan.   Finally, we reviewed reasons to return to care including if symptoms worsen or persist or new concerns arise- once again particularly fever, N/V, or flank pain.    Laurita Quint, FNP

## 2018-12-22 NOTE — Patient Instructions (Signed)
We will send your urine for a culture and contact you with results which will take up to one week.  If symptoms worsen or you develop new symptoms, please follow up for further evaluation.    Dysuria Dysuria is pain or discomfort while urinating. The pain or discomfort may be felt in the part of your body that drains urine from the bladder (urethra) or in the surrounding tissue of the genitals. The pain may also be felt in the groin area, lower abdomen, or lower back. You may have to urinate frequently or have the sudden feeling that you have to urinate (urgency). Dysuria can affect both men and women, but it is more common in women. Dysuria can be caused by many different things, including:  Urinary tract infection.  Kidney stones or bladder stones.  Certain sexually transmitted infections (STIs), such as chlamydia.  Dehydration.  Inflammation of the tissues of the vagina.  Use of certain medicines.  Use of certain soaps or scented products that cause irritation. Follow these instructions at home: General instructions  Watch your condition for any changes.  Urinate often. Avoid holding urine for long periods of time.  After a bowel movement or urination, women should cleanse from front to back, using each tissue only once.  Urinate after sexual intercourse.  Keep all follow-up visits as told by your health care provider. This is important.  If you had any tests done to find the cause of dysuria, it is up to you to get your test results. Ask your health care provider, or the department that is doing the test, when your results will be ready. Eating and drinking   Drink enough fluid to keep your urine pale yellow.  Avoid caffeine, tea, and alcohol. They can irritate the bladder and make dysuria worse. In men, alcohol may irritate the prostate. Medicines  Take over-the-counter and prescription medicines only as told by your health care provider.  If you were prescribed an  antibiotic medicine, take it as told by your health care provider. Do not stop taking the antibiotic even if you start to feel better. Contact a health care provider if:  You have a fever.  You develop pain in your back or sides.  You have nausea or vomiting.  You have blood in your urine.  You are not urinating as often as you usually do. Get help right away if:  Your pain is severe and not relieved with medicines.  You cannot eat or drink without vomiting.  You are confused.  You have a rapid heartbeat while at rest.  You have shaking or chills.  You feel extremely weak. Summary  Dysuria is pain or discomfort while urinating. Many different conditions can lead to dysuria.  If you have dysuria, you may have to urinate frequently or have the sudden feeling that you have to urinate (urgency).  Watch your condition for any changes. Keep all follow-up visits as told by your health care provider.  Make sure that you urinate often and drink enough fluid to keep your urine pale yellow. This information is not intended to replace advice given to you by your health care provider. Make sure you discuss any questions you have with your health care provider. Document Released: 08/14/2004 Document Revised: 09/02/2017 Document Reviewed: 09/02/2017 Elsevier Interactive Patient Education  2019 [employer].

## 2018-12-22 NOTE — Addendum Note (Signed)
Addended by: Marcina Millard on: 12/22/2018 03:55 PM   Modules accepted: Orders

## 2018-12-22 NOTE — Telephone Encounter (Signed)
Appointment schedule with Terance Hart.

## 2018-12-23 LAB — URINE CULTURE
MICRO NUMBER:: 95325
SPECIMEN QUALITY:: ADEQUATE

## 2018-12-27 ENCOUNTER — Encounter: Payer: Self-pay | Admitting: Gastroenterology

## 2018-12-27 ENCOUNTER — Ambulatory Visit (INDEPENDENT_AMBULATORY_CARE_PROVIDER_SITE_OTHER): Payer: Medicare Other | Admitting: Gastroenterology

## 2018-12-27 VITALS — BP 106/60 | HR 60 | Ht 69.0 in | Wt 187.0 lb

## 2018-12-27 DIAGNOSIS — K219 Gastro-esophageal reflux disease without esophagitis: Secondary | ICD-10-CM | POA: Diagnosis not present

## 2018-12-27 NOTE — Progress Notes (Signed)
[facility] Consult Note:  History: Deborah Hudson 12/27/2018  Referring physician: Binnie Rail, MD  Reason for consult/chief complaint: Gastroesophageal Reflux (currently taking 20 mg Prilosec 20 mg OTC but feels it is not doing enough. If she doesn't take it all her symptoms return. )   Subjective  HPI:  This is a very pleasant 73 year old woman referred by primary care for several months of reflux symptoms.  It began shortly after a lower back surgery about 4 months ago, she wondered if it may have been due to a new medicine.  She has been taking NSAIDs regularly, though less so than she had before the surgery.  She began noticing heartburn or regurgitation with a feeling of fullness in the neck.  The neck symptoms have greatly subsided, and the heartburn is better as well as long as she takes omeprazole 20 mg daily.  With that, she has breakthrough heartburn a few times a week that she attributes to certain foods.  [name] denies dysphagia or odynophagia, nausea, vomiting, early satiety or weight loss.  She had a colonoscopy by Dr. Cristina Gong of [facility] in July 2016.  Report indicates complete exam to cecum with excellent preparation, no polyps found.  The procedure indication notes 3 or more adenomas found on 2013 colonoscopy.  She was recommended to have another exam 5 years after the 2016 exam.  (2013 colonoscopy/pathology reports not available) ROS:  Review of Systems  Constitutional: Negative for appetite change and unexpected weight change.  HENT: Negative for mouth sores and voice change.   Eyes: Negative for pain and redness.  Respiratory: Negative for cough and shortness of breath.   Cardiovascular: Negative for chest pain and palpitations.  Genitourinary: Negative for dysuria and hematuria.  Musculoskeletal: Positive for back pain. Negative for arthralgias and myalgias.  Skin: Negative for pallor and rash.  Neurological: Negative for weakness and  headaches.  Hematological: Negative for adenopathy.     Past Medical History: Past Medical History:  Diagnosis Date  . Arthritis    hips  . Back pain   . Bladder infection    frequent /with Macrodantin usage  PRN  . Hyperlipidemia    borderline  . Hypertension   . Hypoglycemia    from DECREASED PROTEIN INTAKE  . Migraine headache    migraines  . Peripheral vascular disease ([place])    varicose veins/ with injections     Past Surgical History: Past Surgical History:  Procedure Laterality Date  . ANTERIOR LAT LUMBAR FUSION N/A 04/18/2018   Procedure: Lumbar Three and Lumbar Four anterior lateral decompression with lateral plate fixation;  Surgeon: Kristeen Miss, MD;  Location: [facility];  Service: Neurosurgery;  Laterality: N/A;  Lumbar Three and Lumbar Four anterior lateral decompression with lateral plate fixation  . BREAST SURGERY     bilateral breast lumpectomy  . CATARACT EXTRACTION Right 2011  . COLONOSCOPY    . CORNEAL TRANSPLANT Right 2011  . EYE SURGERY    . SHOULDER SURGERY Right 2015  . TOE SURGERY     right great toe scrapping for arthritis  . TOTAL HIP ARTHROPLASTY  02/17/2012   Procedure: TOTAL HIP ARTHROPLASTY;  Surgeon: Gearlean Alf, MD;  Location: [facility];  Service: Orthopedics;  Laterality: Right;     Family History: Family History  Problem Relation Age of Onset  . Cancer Mother        lung  . Alzheimer's disease Mother   . Depression Mother   . Stroke Father   .  Cancer Maternal Grandfather        lung  . Stroke Maternal Grandmother     Social History: Social History   Socioeconomic History  . Marital status: Married    Spouse name: Not on file  . Number of children: 2  . Years of education: Not on file  . Highest education level: Not on file  Occupational History  . Occupation: retired    Comment: Chief Financial Officer  Social Needs  . Financial resource strain: Not hard at all  . Food insecurity:    Worry: Never true     Inability: Never true  . Transportation needs:    Medical: No    Non-medical: No  Tobacco Use  . Smoking status: Never Smoker  . Smokeless tobacco: Never Used  Substance and Sexual Activity  . Alcohol use: Yes    Alcohol/week: 2.0 standard drinks    Types: 2 Glasses of wine per week    Comment: Rare  . Drug use: No  . Sexual activity: Yes  Lifestyle  . Physical activity:    Days per week: 3 days    Minutes per session: 30 min  . Stress: Not at all  Relationships  . Social connections:    Talks on phone: More than three times a week    Gets together: More than three times a week    Attends religious service: More than 4 times per year    Active member of club or organization: Yes    Attends meetings of clubs or organizations: More than 4 times per year    Relationship status: Married  Other Topics Concern  . Not on file  Social History Narrative  . 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 Adhesive [Tape]     Rash at the site  . Sulfa Antibiotics Itching    Outpatient Meds: Current Outpatient Medications  Medication Sig Dispense Refill  . Boswellia Serrata (BOSWELLIA PO) Take 3 tablets by mouth at bedtime.    . Carboxymethylcellul-Glycerin (LUBRICATING EYE DROPS OP) Place 1 drop into both eyes as needed (dry eyes).    . cefpodoxime (VANTIN) 100 MG tablet Take 1 tablet (100 mg total) by mouth 2 (two) times daily. 14 tablet 0  . Cranberry (ELLURA PO) Take 1 tablet by mouth at bedtime.    . diclofenac sodium (VOLTAREN) 1 % GEL Apply topically 4 (four) times daily. 100 g 5  . estradiol (ESTRACE VAGINAL) 0.1 MG/GM vaginal cream Place 1 Applicatorful vaginally at bedtime. For two weeks then twice a week (Patient taking differently: Place 1 Applicatorful vaginally at bedtime. ) 42.5 g 12  . GINKGO BILOBA PO Take 1 tablet by mouth daily.    Marland Kitchen ibuprofen (ADVIL,MOTRIN) 200 MG tablet  Take 400 mg by mouth 2 (two) times daily as needed for moderate pain.    Marland Kitchen loteprednol (LOTEMAX) 0.5 % ophthalmic suspension Place 1 drop into the right eye daily.    Marland Kitchen OVER THE COUNTER MEDICATION Take 1 tablet by mouth at bedtime. Mannose B otc supplement    . oxybutynin (DITROPAN-XL) 10 MG 24 hr tablet Take 10 mg by mouth at bedtime.    . SUMAtriptan (IMITREX) 100 MG tablet Take 1 tablet (100 mg total) by mouth every 2 (two) hours as needed. For migraine 10 tablet 5  . triamterene-hydrochlorothiazide (MAXZIDE-25) 37.5-25 MG tablet TAKE 1 TABLET BY MOUTH EVERY DAY 90 tablet 1  .  TURMERIC CURCUMIN PO Take 1 tablet by mouth at bedtime.     No current facility-administered medications for this visit.       ___________________________________________________________________ Objective   Exam:  BP 106/60   Pulse 60   Ht 5\' 9"  (1.753 m)   Wt 187 lb (84.8 kg)   BMI 27.62 kg/m    General: this is a(n) well-appearing woman with normal vocal quality  Eyes: sclera anicteric, no redness  ENT: oral mucosa moist without lesions, no cervical or supraclavicular lymphadenopathy  CV: RRR without murmur, S1/S2, no JVD, no peripheral edema  Resp: clear to auscultation bilaterally, normal RR and effort noted  GI: soft, no tenderness, with active bowel sounds. No guarding or palpable organomegaly noted.  Skin; warm and dry, no rash or jaundice noted  Neuro: awake, alert and oriented x 3. Normal gross motor function and fluent speech   Assessment: Encounter Diagnosis  Name Primary?  . Gastroesophageal reflux disease, esophagitis presence not specified Yes    New onset symptoms several months ago, no red flag symptoms.  However, symptoms persist despite therapy.  Plan:  We discussed reflux and usual triggers as well as nonpharmacologic antireflux measures.  Written materials were given as well.  Upper endoscopy.  Agreeable after discussion of procedure and risks.  The benefits and  risks of the planned procedure were described in detail with the patient or (when appropriate) their health care proxy.  Risks were outlined as including, but not limited to, bleeding, infection, perforation, adverse medication reaction leading to cardiac or pulmonary decompensation, or pancreatitis (if ERCP).  The limitation of incomplete mucosal visualization was also discussed.  No guarantees or warranties were given.   Thank you for the courtesy of this consult.  Please call me with any questions or concerns.  Nelida Meuse III  CC: Referring provider noted above

## 2018-12-27 NOTE — Patient Instructions (Signed)

## 2018-12-29 ENCOUNTER — Ambulatory Visit: Payer: Medicare Other | Admitting: Gastroenterology

## 2018-12-29 DIAGNOSIS — I10 Essential (primary) hypertension: Secondary | ICD-10-CM | POA: Diagnosis not present

## 2018-12-29 DIAGNOSIS — R200 Anesthesia of skin: Secondary | ICD-10-CM | POA: Diagnosis not present

## 2018-12-29 DIAGNOSIS — M4316 Spondylolisthesis, lumbar region: Secondary | ICD-10-CM | POA: Diagnosis not present

## 2018-12-29 DIAGNOSIS — R202 Paresthesia of skin: Secondary | ICD-10-CM | POA: Diagnosis not present

## 2018-12-29 DIAGNOSIS — Z6827 Body mass index (BMI) 27.0-27.9, adult: Secondary | ICD-10-CM | POA: Diagnosis not present

## 2019-01-02 DIAGNOSIS — Z01419 Encounter for gynecological examination (general) (routine) without abnormal findings: Secondary | ICD-10-CM | POA: Diagnosis not present

## 2019-01-02 DIAGNOSIS — Z1231 Encounter for screening mammogram for malignant neoplasm of breast: Secondary | ICD-10-CM | POA: Diagnosis not present

## 2019-01-02 LAB — HM MAMMOGRAPHY

## 2019-01-11 ENCOUNTER — Ambulatory Visit: Payer: Medicare Other | Admitting: Gastroenterology

## 2019-01-17 ENCOUNTER — Encounter: Payer: Self-pay | Admitting: Internal Medicine

## 2019-01-19 DIAGNOSIS — G5602 Carpal tunnel syndrome, left upper limb: Secondary | ICD-10-CM | POA: Diagnosis not present

## 2019-01-20 ENCOUNTER — Ambulatory Visit: Payer: Medicare Other | Admitting: Gastroenterology

## 2019-01-23 ENCOUNTER — Encounter: Payer: Self-pay | Admitting: Gastroenterology

## 2019-01-23 ENCOUNTER — Ambulatory Visit (AMBULATORY_SURGERY_CENTER): Payer: Medicare Other | Admitting: Gastroenterology

## 2019-01-23 VITALS — BP 117/62 | HR 72 | Temp 98.0°F | Resp 15 | Ht 69.0 in | Wt 187.0 lb

## 2019-01-23 DIAGNOSIS — K219 Gastro-esophageal reflux disease without esophagitis: Secondary | ICD-10-CM | POA: Diagnosis not present

## 2019-01-23 DIAGNOSIS — R109 Unspecified abdominal pain: Secondary | ICD-10-CM | POA: Diagnosis not present

## 2019-01-23 DIAGNOSIS — I739 Peripheral vascular disease, unspecified: Secondary | ICD-10-CM | POA: Diagnosis not present

## 2019-01-23 DIAGNOSIS — K317 Polyp of stomach and duodenum: Secondary | ICD-10-CM

## 2019-01-23 DIAGNOSIS — K210 Gastro-esophageal reflux disease with esophagitis: Secondary | ICD-10-CM | POA: Diagnosis not present

## 2019-01-23 MED ORDER — SODIUM CHLORIDE 0.9 % IV SOLN: 500.0000 mL | Freq: Once | INTRAVENOUS | Status: DC

## 2019-01-23 NOTE — Progress Notes (Signed)
Called to room to assist during endoscopic procedure.  Patient ID and intended procedure confirmed with present staff. Received instructions for my participation in the procedure from the performing physician.  

## 2019-01-23 NOTE — Op Note (Signed)
[place] Patient Name: Deborah Hudson Procedure Date: 01/23/2019 9:44 AM MRN: [id number] Endoscopist: Mallie Mussel L. Loletha Carrow , MD Age: 73 Referring MD:  Date of Birth: 1945-12-03 Gender: Female Account #: [id number] Procedure:                Upper GI endoscopy Indications:              Esophageal reflux symptoms that recur despite                            appropriate therapy Medicines:                Monitored Anesthesia Care Procedure:                Pre-Anesthesia Assessment:                           - Prior to the procedure, a History and Physical                            was performed, and patient medications and                            allergies were reviewed. The patient's tolerance of                            previous anesthesia was also reviewed. The risks                            and benefits of the procedure and the sedation                            options and risks were discussed with the patient.                            All questions were answered, and informed consent                            was obtained. Prior Anticoagulants: The patient has                            taken no previous anticoagulant or antiplatelet                            agents. ASA Grade Assessment: II - A patient with                            mild systemic disease. After reviewing the risks                            and benefits, the patient was deemed in                            satisfactory condition to undergo the procedure.  After obtaining informed consent, the endoscope was                            passed under direct vision. Throughout the                            procedure, the patient's blood pressure, pulse, and                            oxygen saturations were monitored continuously. The                            Endoscope was introduced through the mouth, and                            advanced to the second part of duodenum.  The upper                            GI endoscopy was accomplished without difficulty.                            The patient tolerated the procedure well. Scope In: Scope Out: Findings:                 The larynx was normal.                           There is no endoscopic evidence of esophagitis,                            hiatal hernia or stricture in the entire esophagus.                           Two diminutive [place] of salmon-colored mucosa                            were present from 37 to 39 cm. No other visible                            abnormalities were present. Biopsies were taken                            with a cold forceps for histology.                           A few diminutive sessile fundic gland polyps were                            found in the gastric fundus.                           The cardia and gastric fundus were normal on                            retroflexion.  The examined duodenum was normal. Complications:            No immediate complications. Estimated Blood Loss:     Estimated blood loss was minimal. Impression:               - Normal larynx.                           - Salmon-colored mucosa. Biopsied to rule out                            Barrett's esophagus.                           - A few fundic gland polyps.                           - Normal examined duodenum. Recommendation:           - Patient has a contact number available for                            emergencies. The signs and symptoms of potential                            delayed complications were discussed with the                            patient. Return to normal activities tomorrow.                            Written discharge instructions were provided to the                            patient.                           - Resume previous diet.                           - Continue present medications.                           - Await pathology  results.                           - Follow an antireflux regimen indefinitely. Xerxes Agrusa L. Loletha Carrow, MD 01/23/2019 10:09:54 AM This report has been signed electronically.

## 2019-01-23 NOTE — Patient Instructions (Signed)
Impression/Recommendations:  GERD handout given to patient.  Resume previous diet.  Continue present medications.  Await pathology results.  Follow antireflux regimen indefinitely.  YOU HAD AN ENDOSCOPIC PROCEDURE TODAY AT [facility]:   Refer to the procedure report that was given to you for any specific questions about what was found during the examination.  If the procedure report does not answer your questions, please call your gastroenterologist to clarify.  If you requested that your care partner not be given the details of your procedure findings, then the procedure report has been included in a sealed envelope for you to review at your convenience later.  YOU SHOULD EXPECT: Some feelings of bloating in the abdomen. Passage of more gas than usual.  Walking can help get rid of the air that was put into your GI tract during the procedure and reduce the bloating. If you had a lower endoscopy (such as a colonoscopy or flexible sigmoidoscopy) you may notice spotting of blood in your stool or on the toilet paper. If you underwent a bowel prep for your procedure, you may not have a normal bowel movement for a few days.  Please Note:  You might notice some irritation and congestion in your nose or some drainage.  This is from the oxygen used during your procedure.  There is no need for concern and it should clear up in a day or so.  SYMPTOMS TO REPORT IMMEDIATELY:  Following upper endoscopy (EGD)  Vomiting of blood or coffee ground material  New chest pain or pain under the shoulder blades  Painful or persistently difficult swallowing  New shortness of breath  Fever of 100F or higher  Black, tarry-looking stools  For urgent or emergent issues, a gastroenterologist can be reached at any hour by calling [phone].   DIET:  We do recommend a small meal at first, but then you may proceed to your regular diet.  Drink plenty of fluids but you should avoid alcoholic  beverages for 24 hours.  ACTIVITY:  You should plan to take it easy for the rest of today and you should NOT DRIVE or use heavy machinery until tomorrow (because of the sedation medicines used during the test).    FOLLOW UP: Our staff will call the number listed on your records the next business day following your procedure to check on you and address any questions or concerns that you may have regarding the information given to you following your procedure. If we do not reach you, we will leave a message.  However, if you are feeling well and you are not experiencing any problems, there is no need to return our call.  We will assume that you have returned to your regular daily activities without incident.  If any biopsies were taken you will be contacted by phone or by letter within the next 1-3 weeks.  Please call us at [phone] if you have not heard about the biopsies in 3 weeks.    SIGNATURES/CONFIDENTIALITY: You and/or your care partner have signed paperwork which will be entered into your electronic medical record.  These signatures attest to the fact that that the information above on your After Visit Summary has been reviewed and is understood.  Full responsibility of the confidentiality of this discharge information lies with you and/or your care-partner.

## 2019-01-23 NOTE — Progress Notes (Signed)
Report given to PACU, vss 

## 2019-01-24 ENCOUNTER — Telehealth: Payer: Self-pay | Admitting: *Deleted

## 2019-01-24 ENCOUNTER — Telehealth: Payer: Self-pay

## 2019-01-24 NOTE — Telephone Encounter (Signed)
  Follow up Call-  Call back number 01/23/2019  Post procedure Call Back phone  # [phone]  Permission to leave phone message Yes  Some recent data might be hidden     Patient questions:  Do you have a fever, pain , or abdominal swelling? No. Pain Score  0 *  Have you tolerated food without any problems? Yes.    Have you been able to return to your normal activities? Yes.    Do you have any questions about your discharge instructions: Diet   No. Medications  No. Follow up visit  No.  Do you have questions or concerns about your Care? No.  Actions: * If pain score is 4 or above: No action needed, pain <4.

## 2019-01-24 NOTE — Telephone Encounter (Signed)
NO ANSWER, MESSAGE LEFT FOR PATIENT. 

## 2019-01-25 DIAGNOSIS — Z6827 Body mass index (BMI) 27.0-27.9, adult: Secondary | ICD-10-CM | POA: Diagnosis not present

## 2019-01-25 DIAGNOSIS — R030 Elevated blood-pressure reading, without diagnosis of hypertension: Secondary | ICD-10-CM | POA: Diagnosis not present

## 2019-01-25 DIAGNOSIS — G5602 Carpal tunnel syndrome, left upper limb: Secondary | ICD-10-CM | POA: Diagnosis not present

## 2019-01-30 ENCOUNTER — Encounter: Payer: Self-pay | Admitting: Gastroenterology

## 2019-01-30 DIAGNOSIS — Z947 Corneal transplant status: Secondary | ICD-10-CM | POA: Diagnosis not present

## 2019-01-30 DIAGNOSIS — Z961 Presence of intraocular lens: Secondary | ICD-10-CM | POA: Diagnosis not present

## 2019-01-30 DIAGNOSIS — H04123 Dry eye syndrome of bilateral lacrimal glands: Secondary | ICD-10-CM | POA: Diagnosis not present

## 2019-01-30 DIAGNOSIS — H2512 Age-related nuclear cataract, left eye: Secondary | ICD-10-CM | POA: Diagnosis not present

## 2019-01-30 DIAGNOSIS — H1851 Endothelial corneal dystrophy: Secondary | ICD-10-CM | POA: Diagnosis not present

## 2019-01-30 DIAGNOSIS — H40023 Open angle with borderline findings, high risk, bilateral: Secondary | ICD-10-CM | POA: Diagnosis not present

## 2019-02-07 ENCOUNTER — Telehealth: Payer: Self-pay | Admitting: Gastroenterology

## 2019-02-07 NOTE — Telephone Encounter (Signed)
Results letter reviewed with pt and questions answered.

## 2019-02-07 NOTE — Telephone Encounter (Signed)
Pt called had a upper endo on 2-24 and has not heard back from [place] would like to know the results.

## 2019-02-11 ENCOUNTER — Other Ambulatory Visit: Payer: Self-pay | Admitting: Internal Medicine

## 2019-02-13 NOTE — Telephone Encounter (Signed)
Copied from [place] [id number]. Topic: Quick Communication - Rx Refill/Question >> Feb 13, 2019  8:46 AM Alanda Slim E wrote: Medication: triamterene-hydrochlorothiazide (MAXZIDE-25) 37.5-25 MG tablet - Pt BP was 121/81 at a [facility] office last week but Pt checks BP often at home and Pt is self isolating due to her age but needs a refill on the medication until she is able to come to the office/Pt doesn't want to be exposed to anyone that maybe sick.   Has the patient contacted their pharmacy? Yes - Call PCP  Preferred Pharmacy (with phone number or street name): [id number] - [place], [place] - [address] [place] [address] [phone] (Phone) [phone] (Fax)    Agent: Please be advised that RX refills may take up to 3 business days. We ask that you follow-up with your pharmacy.

## 2019-02-27 ENCOUNTER — Telehealth: Payer: Self-pay | Admitting: *Deleted

## 2019-02-27 NOTE — Telephone Encounter (Signed)
Called patient and LVM to inform them the nurse needs to either convert their upcoming AWV to a virtual visit or reschedule the visit out into the future due to covid-19 safety measures. AWV was rescheduled for 08/14/19.

## 2019-03-06 ENCOUNTER — Telehealth: Payer: Self-pay | Admitting: Internal Medicine

## 2019-03-06 DIAGNOSIS — N3001 Acute cystitis with hematuria: Secondary | ICD-10-CM | POA: Diagnosis not present

## 2019-03-06 NOTE — Telephone Encounter (Signed)
Copied from [place] [id number]. Topic: Quick Communication - Rx Refill/Question >> Mar 06, 2019  4:43 PM Nils Flack, Marland Kitchen wrote: Medication: cephalaxin for uti  Has the patient contacted their pharmacy? no (Agent: If no, request that the patient contact the pharmacy for the refill.) (Agent: If yes, when and what did the pharmacy advise?)  Preferred Pharmacy (with phone number [facility] street [facility]  Pt has tested at home and has a positive for uti.  She is asking for abx to be sent to pharmacy    Agent: Please be advised that RX refills may take up to 3 business days. We ask that you follow-up with your pharmacy.

## 2019-03-07 NOTE — Telephone Encounter (Signed)
Do you prefer a visit of some sort?

## 2019-03-07 NOTE — Telephone Encounter (Signed)
She should have a virtual visit.

## 2019-03-07 NOTE — Telephone Encounter (Signed)
Pt stated that [facility] and was given [facility]bid.

## 2019-03-15 DIAGNOSIS — S3992XA Unspecified injury of lower back, initial encounter: Secondary | ICD-10-CM | POA: Diagnosis not present

## 2019-03-16 DIAGNOSIS — S3992XA Unspecified injury of lower back, initial encounter: Secondary | ICD-10-CM | POA: Diagnosis not present

## 2019-03-17 DIAGNOSIS — M4316 Spondylolisthesis, lumbar region: Secondary | ICD-10-CM | POA: Diagnosis not present

## 2019-03-21 ENCOUNTER — Ambulatory Visit: Payer: Medicare Other

## 2019-06-22 ENCOUNTER — Encounter: Payer: Self-pay | Admitting: Internal Medicine

## 2019-06-22 ENCOUNTER — Other Ambulatory Visit: Payer: Self-pay

## 2019-06-22 ENCOUNTER — Ambulatory Visit (INDEPENDENT_AMBULATORY_CARE_PROVIDER_SITE_OTHER): Payer: Medicare Other | Admitting: Internal Medicine

## 2019-06-22 DIAGNOSIS — N390 Urinary tract infection, site not specified: Secondary | ICD-10-CM | POA: Insufficient documentation

## 2019-06-22 DIAGNOSIS — N3000 Acute cystitis without hematuria: Secondary | ICD-10-CM | POA: Diagnosis not present

## 2019-06-22 MED ORDER — CEPHALEXIN 500 MG PO CAPS: 500.0000 mg | ORAL_CAPSULE | Freq: Two times a day (BID) | ORAL | 0 refills | Status: DC

## 2019-06-22 NOTE — Assessment & Plan Note (Signed)
Has a history of frequent urinary tract infections and given her history and start of mild symptoms we will go ahead and start treatment empirically Keflex twice daily x7 days Have her symptoms do not improve will need to check a urine

## 2019-06-22 NOTE — Progress Notes (Signed)
Virtual Visit via Video Note  I connected with Deborah Hudson on 06/22/19 at  3:45 PM EDT by a video enabled telemedicine application and verified that I am speaking with the correct person using two identifiers.   I discussed the limitations of evaluation and management by telemedicine and the availability of in person appointments. The patient expressed understanding and agreed to proceed.  The patient is currently at home and I am in the office.    No referring provider.    History of Present Illness: This is an acute visit for possible UTI.  She is a history of frequent urinary tract infections.  Her symptoms started yesterday.  She has mild dysuria and just an uncomfortable feeling.  She denies any fevers, chills, domino pain, nausea and blood in urine.  There is no obvious increase in frequency urgency.   Review of Systems  Constitutional: Negative for chills and fever.  Gastrointestinal: Negative for abdominal pain and nausea.  Genitourinary: Positive for dysuria. Negative for hematuria.     Social History   Socioeconomic History  . Marital status: Married    Spouse name: Not on file  . Number of children: 2  . Years of education: Not on file  . Highest education level: Not on file  Occupational History  . Occupation: retired    Comment: Chief Financial Officer  Social Needs  . Financial resource strain: Not hard at all  . Food insecurity    Worry: Never true    Inability: Never true  . Transportation needs    Medical: No    Non-medical: No  Tobacco Use  . Smoking status: Never Smoker  . Smokeless tobacco: Never Used  Substance and Sexual Activity  . Alcohol use: Yes    Alcohol/week: 2.0 standard drinks    Types: 2 Glasses of wine per week    Comment: Rare  . Drug use: No  . Sexual activity: Yes  Lifestyle  . Physical activity    Days per week: 3 days    Minutes per session: 30 min  . Stress: Not at all  Relationships  . Social connections   Talks on phone: More than three times a week    Gets together: More than three times a week    Attends religious service: More than 4 times per year    Active member of club or organization: Yes    Attends meetings of clubs or organizations: More than 4 times per year    Relationship status: Married  Other Topics Concern  . Not on file  Social History Narrative  . Not on file     Observations/Objective: Appears well in NAD   Assessment and Plan:  See Problem List for Assessment and Plan of chronic medical problems.   Follow Up Instructions:    I discussed the assessment and treatment plan with the patient. The patient was provided an opportunity to ask questions and all were answered. The patient agreed with the plan and demonstrated an understanding of the instructions.   The patient was advised to call back or seek an in-person evaluation if the symptoms worsen or if the condition fails to improve as anticipated.    Binnie Rail, MD

## 2019-07-07 DIAGNOSIS — Z947 Corneal transplant status: Secondary | ICD-10-CM | POA: Diagnosis not present

## 2019-07-07 DIAGNOSIS — H43811 Vitreous degeneration, right eye: Secondary | ICD-10-CM | POA: Diagnosis not present

## 2019-07-07 DIAGNOSIS — H11823 Conjunctivochalasis, bilateral: Secondary | ICD-10-CM | POA: Diagnosis not present

## 2019-07-12 ENCOUNTER — Telehealth: Payer: Self-pay | Admitting: Emergency Medicine

## 2019-07-12 NOTE — Telephone Encounter (Signed)
Pts last follow-up was in 02/19

## 2019-07-12 NOTE — Telephone Encounter (Signed)
She is on blood pressure medication and she is overdue for a follow-up visit.  We could do this virtually and then she could come in for blood work at at a later date.

## 2019-07-12 NOTE — Telephone Encounter (Signed)
Copied from [place] [id number]. Topic: General - Other >> Jul 12, 2019  3:48 PM Percell Belt A wrote: Reason for CRM: pt called in and would like to know if Dr burns could be in a order to have her potassium levels checked without making an appt  Best number  [phone]

## 2019-07-13 DIAGNOSIS — M25552 Pain in left hip: Secondary | ICD-10-CM | POA: Diagnosis not present

## 2019-07-13 DIAGNOSIS — Z471 Aftercare following joint replacement surgery: Secondary | ICD-10-CM | POA: Diagnosis not present

## 2019-07-13 DIAGNOSIS — Z96641 Presence of right artificial hip joint: Secondary | ICD-10-CM | POA: Diagnosis not present

## 2019-07-13 NOTE — Telephone Encounter (Signed)
Appointment made for Monday 8/17

## 2019-07-16 NOTE — Progress Notes (Signed)
Subjective:    Patient ID: Deborah Hudson, female    DOB: [birth year], 73 y.o.   MRN: [id number]  HPI The patient is here for follow up.  She is exercising minimally -has not been walking since it has been so hot.     She did not take the antibiotic for the UTI the end of last month and her symptoms resolved.  She plans on following up with her gynecologist.  She is using the vaginal estrogen cream.  Hypertension: She is taking her medication daily. She is compliant with a low sodium diet.  She denies chest pain, palpitations, shortness of breath and regular headaches.      Prediabetes:  She is compliant with a low sugar/carbohydrate diet.  She is not exercising regularly.  Leg cramps a night: She has been getting leg cramps at night.  She states it feels like her legs are just not getting enough oxygen with a feel fatigued at night.  She does drink a lot of water throughout the day.  She has not been exercising regularly recently.   She did have lower back issues, but had surgery last year and has not had any problems since.  Medications and allergies reviewed with patient and updated if appropriate.  Patient Active Problem List   Diagnosis Date Noted  . Leg cramping 07/17/2019  . UTI (urinary tract infection) 06/22/2019  . Postnasal drip 09/14/2018  . GERD (gastroesophageal reflux disease) 08/02/2018  . Lumbar stenosis with neurogenic claudication 04/18/2018  . Chronic sinus infection 01/24/2018  . Lower back pain 05/24/2017  . Prediabetes 12/29/2016  . Gallstones 11/18/2015  . Essential hypertension, benign 11/18/2015  . Hypoglycemia 11/18/2015  . Urinary incontinence 11/18/2015  . Atrophy, Fuchs' 03/28/2014  . Osteoarthritis of hip 02/17/2012  . Frequent UTI 10/05/2011  . Arthritis   . Migraine headache     Current Outpatient Medications on File Prior to Visit  Medication Sig Dispense Refill  . Boswellia Serrata (BOSWELLIA PO) Take 3 tablets by mouth at bedtime.    .  Carboxymethylcellul-Glycerin (LUBRICATING EYE DROPS OP) Place 1 drop into both eyes as needed (dry eyes).    . Cranberry (ELLURA PO) Take 1 tablet by mouth at bedtime.    . diclofenac sodium (VOLTAREN) 1 % GEL Apply topically 4 (four) times daily. 100 g 5  . estradiol (ESTRACE VAGINAL) 0.1 MG/GM vaginal cream Place 1 Applicatorful vaginally at bedtime. For two weeks then twice a week (Patient taking differently: Place 1 Applicatorful vaginally at bedtime. ) 42.5 g 12  . GINKGO BILOBA PO Take 1 tablet by mouth daily.    Marland Kitchen loteprednol (LOTEMAX) 0.5 % ophthalmic suspension Place 1 drop into the right eye daily.    Marland Kitchen OVER THE COUNTER MEDICATION Take 1 tablet by mouth at bedtime. Mannose B otc supplement    . SUMAtriptan (IMITREX) 100 MG tablet Take 1 tablet (100 mg total) by mouth every 2 (two) hours as needed. For migraine 10 tablet 5  . TURMERIC CURCUMIN PO Take 1 tablet by mouth at bedtime.     No current facility-administered medications on file prior to visit.     Past Medical History:  Diagnosis Date  . Arthritis    hips  . Back pain   . Bladder infection    frequent /with Macrodantin usage  PRN  . Hyperlipidemia    borderline  . Hypertension   . Hypoglycemia    from DECREASED PROTEIN INTAKE  . Migraine headache  migraines  . Peripheral vascular disease ([place])    varicose veins/ with injections    Past Surgical History:  Procedure Laterality Date  . ANTERIOR LAT LUMBAR FUSION N/A 04/18/2018   Procedure: Lumbar Three and Lumbar Four anterior lateral decompression with lateral plate fixation;  Surgeon: Kristeen Miss, MD;  Location: [facility];  Service: Neurosurgery;  Laterality: N/A;  Lumbar Three and Lumbar Four anterior lateral decompression with lateral plate fixation  . BREAST SURGERY     bilateral breast lumpectomy  . CATARACT EXTRACTION Right 2011  . COLONOSCOPY    . CORNEAL TRANSPLANT Right 2011  . EYE SURGERY    . SHOULDER SURGERY Right 2015  . TOE SURGERY     right  great toe scrapping for arthritis  . TOTAL HIP ARTHROPLASTY  02/17/2012   Procedure: TOTAL HIP ARTHROPLASTY;  Surgeon: Gearlean Alf, MD;  Location: [facility];  Service: Orthopedics;  Laterality: Right;    Social History   Socioeconomic History  . Marital status: Married    Spouse name: Not on file  . Number of children: 2  . Years of education: Not on file  . Highest education level: Not on file  Occupational History  . Occupation: retired    Comment: Chief Financial Officer  Social Needs  . Financial resource strain: Not hard at all  . Food insecurity    Worry: Never true    Inability: Never true  . Transportation needs    Medical: No    Non-medical: No  Tobacco Use  . Smoking status: Never Smoker  . Smokeless tobacco: Never Used  Substance and Sexual Activity  . Alcohol use: Yes    Alcohol/week: 2.0 standard drinks    Types: 2 Glasses of wine per week    Comment: Rare  . Drug use: No  . Sexual activity: Yes  Lifestyle  . Physical activity    Days per week: 3 days    Minutes per session: 30 min  . Stress: Not at all  Relationships  . Social connections    Talks on phone: More than three times a week    Gets together: More than three times a week    Attends religious service: More than 4 times per year    Active member of club or organization: Yes    Attends meetings of clubs or organizations: More than 4 times per year    Relationship status: Married  Other Topics Concern  . Not on file  Social History Narrative  . Not on file    Family History  Problem Relation Age of Onset  . Cancer Mother        lung  . Alzheimer's disease Mother   . Depression Mother   . Colon polyps Mother   . Colon cancer Mother   . Stroke Father   . Cancer Maternal Grandfather        lung  . Stroke Maternal Grandmother     Review of Systems  Constitutional: Negative for chills and fever.  Respiratory: Negative for cough, shortness of breath and wheezing.   Cardiovascular:  Positive for leg swelling (occasional). Negative for chest pain and palpitations.  Genitourinary: Negative for dysuria.  Neurological: Positive for headaches.       Objective:   Vitals:   07/17/19 0909 07/17/19 1011  BP: (!) 142/90 122/80  Pulse: 81   Resp: 16   Temp: 98.5 F (36.9 C)   SpO2: 98%    BP Readings from Last 3 Encounters:  07/17/19 122/80  01/23/19 117/62  12/27/18 106/60   Wt Readings from Last 3 Encounters:  07/17/19 188 lb (85.3 kg)  01/23/19 187 lb (84.8 kg)  12/27/18 187 lb (84.8 kg)   Body mass index is 27.76 kg/m.   Physical Exam    Constitutional: Appears well-developed and well-nourished. No distress.  HENT:  Head: Normocephalic and atraumatic.  Neck: Neck supple. No tracheal deviation present. No thyromegaly present.  No cervical lymphadenopathy Cardiovascular: Normal rate, regular rhythm and normal heart sounds.   No murmur heard. No carotid bruit .  No edema Pulmonary/Chest: Effort normal and breath sounds normal. No respiratory distress. No has no wheezes. No rales.  Skin: Skin is warm and dry. Not diaphoretic.  Psychiatric: Normal mood and affect. Behavior is normal.      Assessment & Plan:    See Problem List for Assessment and Plan of chronic medical problems.

## 2019-07-16 NOTE — Patient Instructions (Addendum)
  Tests ordered today. Your results will be released to MyChart (or called to you) after review.  If any changes need to be made, you will be notified at that same time.    Medications reviewed and updated.  Changes include :   none     Please followup in 6 months   

## 2019-07-17 ENCOUNTER — Other Ambulatory Visit: Payer: Self-pay

## 2019-07-17 ENCOUNTER — Other Ambulatory Visit (INDEPENDENT_AMBULATORY_CARE_PROVIDER_SITE_OTHER): Payer: Medicare Other

## 2019-07-17 ENCOUNTER — Encounter: Payer: Self-pay | Admitting: Internal Medicine

## 2019-07-17 ENCOUNTER — Ambulatory Visit (INDEPENDENT_AMBULATORY_CARE_PROVIDER_SITE_OTHER): Payer: Medicare Other | Admitting: Internal Medicine

## 2019-07-17 VITALS — BP 122/80 | HR 81 | Temp 98.5°F | Resp 16 | Ht 69.0 in | Wt 188.0 lb

## 2019-07-17 DIAGNOSIS — R252 Cramp and spasm: Secondary | ICD-10-CM | POA: Diagnosis not present

## 2019-07-17 DIAGNOSIS — G43009 Migraine without aura, not intractable, without status migrainosus: Secondary | ICD-10-CM | POA: Diagnosis not present

## 2019-07-17 DIAGNOSIS — R7303 Prediabetes: Secondary | ICD-10-CM

## 2019-07-17 DIAGNOSIS — I10 Essential (primary) hypertension: Secondary | ICD-10-CM

## 2019-07-17 LAB — COMPREHENSIVE METABOLIC PANEL
ALT: 15 U/L (ref 0–35)
AST: 16 U/L (ref 0–37)
Albumin: 4.5 g/dL (ref 3.5–5.2)
Alkaline Phosphatase: 74 U/L (ref 39–117)
BUN: 19 mg/dL (ref 6–23)
CO2: 28 mEq/L (ref 19–32)
Calcium: 9.6 mg/dL (ref 8.4–10.5)
Chloride: 99 mEq/L (ref 96–112)
Creatinine, Ser: 0.85 mg/dL (ref 0.40–1.20)
GFR: 65.71 mL/min (ref >60.00)
Glucose, Bld: 104 mg/dL — ABNORMAL HIGH (ref 70–99)
Potassium: 3.7 mEq/L (ref 3.5–5.1)
Sodium: 137 mEq/L (ref 135–145)
Total Bilirubin: 0.6 mg/dL (ref 0.2–1.2)
Total Protein: 7.5 g/dL (ref 6.0–8.3)

## 2019-07-17 LAB — CBC WITH DIFFERENTIAL/PLATELET
Basophils Absolute: 0.1 10*3/uL (ref 0.0–0.1)
Basophils Relative: 1 % (ref 0.0–3.0)
Eosinophils Absolute: 0.3 10*3/uL (ref 0.0–0.7)
Eosinophils Relative: 4.2 % (ref 0.0–5.0)
HCT: 43.7 % (ref 36.0–46.0)
Hemoglobin: 15 g/dL (ref 12.0–15.0)
Lymphocytes Relative: 25 % (ref 12.0–46.0)
Lymphs Abs: 1.9 10*3/uL (ref 0.7–4.0)
MCHC: 34.4 g/dL (ref 30.0–36.0)
MCV: 92.6 fl (ref 78.0–100.0)
Monocytes Absolute: 0.9 10*3/uL (ref 0.1–1.0)
Monocytes Relative: 11.2 % (ref 3.0–12.0)
Neutro Abs: 4.5 10*3/uL (ref 1.4–7.7)
Neutrophils Relative %: 58.6 % (ref 43.0–77.0)
Platelets: 297 10*3/uL (ref 150.0–400.0)
RBC: 4.71 Mil/uL (ref 3.87–5.11)
RDW: 12.9 % (ref 11.5–15.5)
WBC: 7.6 10*3/uL (ref 4.0–10.5)

## 2019-07-17 LAB — MAGNESIUM: Magnesium: 2 mg/dL (ref 1.5–2.5)

## 2019-07-17 LAB — TSH: TSH: 1.96 u[IU]/mL (ref 0.35–4.50)

## 2019-07-17 LAB — HEMOGLOBIN A1C: Hgb A1c MFr Bld: 5.5 % (ref 4.6–6.5)

## 2019-07-17 MED ORDER — TRIAMTERENE-HCTZ 37.5-25 MG PO TABS: 1.0000 | ORAL_TABLET | Freq: Every day | ORAL | 1 refills | Status: DC

## 2019-07-17 NOTE — Assessment & Plan Note (Addendum)
Has frequent headaches otc medications as needed first then imitrex prn-taking Imitrex about twice a month Continue above regimen

## 2019-07-17 NOTE — Assessment & Plan Note (Signed)
BP well controlled Current regimen effective and well tolerated Continue current medications at current doses cmp  

## 2019-07-17 NOTE — Assessment & Plan Note (Signed)
Experiencing leg cramping at night She is drinking plenty of water She denies lower back issues, but did have back surgery last year ?  Low potassium,?  Low magnesium CMP, magnesium

## 2019-07-17 NOTE — Assessment & Plan Note (Signed)
Check a1c Low sugar / carb diet Stressed regular exercise   

## 2019-07-19 ENCOUNTER — Telehealth: Payer: Self-pay

## 2019-07-19 NOTE — Telephone Encounter (Signed)
Received surgical clearance for [facility]. Called pt and LVM letting her know to call back to schedule a surgical clearance appointment the end of October

## 2019-07-20 ENCOUNTER — Telehealth: Payer: Self-pay

## 2019-07-20 NOTE — Telephone Encounter (Signed)
Copied from [place] [phone]. Topic: General - Other >> Jul 20, 2019 11:46 AM Jodie Echevaria wrote: Reason for CRM: Patient would like to inform Dr Quay Burow and Lovena Le that her BP is elevated and she want to know how long to monitor it before coming in to see Dr Quay Burow. Please advise Ph# [phone]

## 2019-07-20 NOTE — Telephone Encounter (Signed)
Spoke with patient and info given 

## 2019-07-20 NOTE — Telephone Encounter (Signed)
She can monitor it for about two weeks

## 2019-08-01 DIAGNOSIS — G5602 Carpal tunnel syndrome, left upper limb: Secondary | ICD-10-CM | POA: Diagnosis not present

## 2019-08-14 ENCOUNTER — Ambulatory Visit (INDEPENDENT_AMBULATORY_CARE_PROVIDER_SITE_OTHER): Payer: Medicare Other | Admitting: *Deleted

## 2019-08-14 DIAGNOSIS — Z0000 Encounter for general adult medical examination without abnormal findings: Secondary | ICD-10-CM

## 2019-08-14 NOTE — Progress Notes (Addendum)
Subjective:   Deborah Hudson is a 73 y.o. female who presents for Medicare Annual (Subsequent) preventive examination. I connected with patient by a telephone and verified that I am speaking with the correct person using two identifiers. Patient stated full name and DOB. Patient gave permission to continue with telephonic visit. Patient's location was at home and Nurse's location was at [place] office.   Review of Systems:   Cardiac Risk Factors include: advanced age (>10men, >43 women);dyslipidemia;hypertension Sleep patterns: gets up 1-2 times nightly to void and sleeps 6-8 hours nightly.   Home Safety/Smoke Alarms: Feels safe in home. Smoke alarms in place.  Living environment; residence and Firearm Safety: 2-story house. Lives with husband, no needs for DME, good support system Seat Belt Safety/Bike Helmet: Wears seat belt.     Objective:     Vitals: There were no vitals taken for this visit.  There is no height or weight on file to calculate BMI.  Advanced Directives 08/14/2019 04/18/2018 04/18/2018 03/15/2018 02/17/2012 02/12/2012  Does Patient Have a Medical Advance Directive? Yes Yes Yes Yes Patient has advance directive, copy in chart Patient has advance directive, copy in chart  Type of Advance Directive [place];Living will [place];Living will [place];Living will [place];Living will Living will;Healthcare Power of Attorney Living will;Healthcare Power of Attorney  Does patient want to make changes to medical advance directive? - No - Patient declined - - - -  Copy of [place] in Chart? No - copy requested Yes Yes No - copy requested - -  Pre-existing out of facility DNR order (yellow form or pink MOST form) - - - - No -    Tobacco Social History   Tobacco Use  Smoking Status Never Smoker  Smokeless Tobacco Never Used     Counseling given: Not Answered  Past Medical  History:  Diagnosis Date  . Arthritis    hips  . Back pain   . Bladder infection    frequent /with Macrodantin usage  PRN  . Hyperlipidemia    borderline  . Hypertension   . Hypoglycemia    from DECREASED PROTEIN INTAKE  . Migraine headache    migraines  . Peripheral vascular disease ([place])    varicose veins/ with injections   Past Surgical History:  Procedure Laterality Date  . ANTERIOR LAT LUMBAR FUSION N/A 04/18/2018   Procedure: Lumbar Three and Lumbar Four anterior lateral decompression with lateral plate fixation;  Surgeon: Kristeen Miss, MD;  Location: [facility];  Service: Neurosurgery;  Laterality: N/A;  Lumbar Three and Lumbar Four anterior lateral decompression with lateral plate fixation  . BREAST SURGERY     bilateral breast lumpectomy  . CATARACT EXTRACTION Right 2011  . COLONOSCOPY    . CORNEAL TRANSPLANT Right 2011  . EYE SURGERY    . SHOULDER SURGERY Right 2015  . TOE SURGERY     right great toe scrapping for arthritis  . TOTAL HIP ARTHROPLASTY  02/17/2012   Procedure: TOTAL HIP ARTHROPLASTY;  Surgeon: Gearlean Alf, MD;  Location: [facility];  Service: Orthopedics;  Laterality: Right;   Family History  Problem Relation Age of Onset  . Cancer Mother        lung  . Alzheimer's disease Mother   . Depression Mother   . Colon polyps Mother   . Colon cancer Mother   . Stroke Father   . Cancer Maternal Grandfather  lung  . Stroke Maternal Grandmother    Social History   Socioeconomic History  . Marital status: Married    Spouse name: Not on file  . Number of children: 2  . Years of education: Not on file  . Highest education level: Not on file  Occupational History  . Occupation: retired    Comment: Chief Financial Officer  Social Needs  . Financial resource strain: Not hard at all  . Food insecurity    Worry: Never true    Inability: Never true  . Transportation needs    Medical: No    Non-medical: No  Tobacco Use  . Smoking status: Never  Smoker  . Smokeless tobacco: Never Used  Substance and Sexual Activity  . Alcohol use: Yes    Alcohol/week: 2.0 standard drinks    Types: 2 Glasses of Jsean Taussig per week    Comment: Rare  . Drug use: No  . Sexual activity: Yes  Lifestyle  . Physical activity    Days per week: 3 days    Minutes per session: 50 min  . Stress: Not at all  Relationships  . Social connections    Talks on phone: More than three times a week    Gets together: More than three times a week    Attends religious service: More than 4 times per year    Active member of club or organization: Yes    Attends meetings of clubs or organizations: More than 4 times per year    Relationship status: Married  Other Topics Concern  . Not on file  Social History Narrative  . Not on file    Outpatient Encounter Medications as of 08/14/2019  Medication Sig  . Boswellia Serrata (BOSWELLIA PO) Take 3 tablets by mouth at bedtime.  . Carboxymethylcellul-Glycerin (LUBRICATING EYE DROPS OP) Place 1 drop into both eyes as needed (dry eyes).  . Cranberry (ELLURA PO) Take 1 tablet by mouth at bedtime.  . diclofenac sodium (VOLTAREN) 1 % GEL Apply topically 4 (four) times daily.  Marland Kitchen estradiol (ESTRACE VAGINAL) 0.1 MG/GM vaginal cream Place 1 Applicatorful vaginally at bedtime. For two weeks then twice a week (Patient taking differently: Place 1 Applicatorful vaginally at bedtime. )  . GINKGO BILOBA PO Take 1 tablet by mouth daily.  Marland Kitchen OVER THE COUNTER MEDICATION Take 1 tablet by mouth at bedtime. Mannose B otc supplement  . SUMAtriptan (IMITREX) 100 MG tablet Take 1 tablet (100 mg total) by mouth every 2 (two) hours as needed. For migraine  . triamterene-hydrochlorothiazide (MAXZIDE-25) 37.5-25 MG tablet Take 1 tablet by mouth daily.  . TURMERIC CURCUMIN PO Take 1 tablet by mouth at bedtime.  . [DISCONTINUED] loteprednol (LOTEMAX) 0.5 % ophthalmic suspension Place 1 drop into the right eye daily.   No facility-administered encounter  medications on file as of 08/14/2019.     Activities of Daily Living In your present state of health, do you have any difficulty performing the following activities: 08/14/2019  Hearing? N  Vision? N  Difficulty concentrating or making decisions? N  Walking or climbing stairs? N  Dressing or bathing? N  Doing errands, shopping? N  Preparing Food and eating ? N  Using the Toilet? N  In the past six months, have you accidently leaked urine? N  Do you have problems with loss of bowel control? N  Managing your Medications? N  Managing your Finances? N  Housekeeping or managing your Housekeeping? N  Some recent data might be hidden  Patient Care Team: Binnie Rail, MD as PCP - General (Internal Medicine)    Assessment:   This is a routine wellness examination for [place]. Physical assessment deferred to PCP.  Exercise Activities and Dietary recommendations Current Exercise Habits: Home exercise routine, Type of exercise: walking, Time (Minutes): 50, Frequency (Times/Week): 5, Weekly Exercise (Minutes/Week): 250, Intensity: Mild, Exercise limited by: orthopedic condition(s)  Diet (meal preparation, eat out, water intake, caffeinated beverages, dairy products, fruits and vegetables): in general, a "healthy" diet  , well balanced   Reviewed heart healthy diet. Encouraged patient to increase daily water and healthy fluid intake.  Goals    . Patient Stated     Continue to walk, eat healthy, enjoy life, family and worship God.       Fall Risk Fall Risk  08/14/2019 07/17/2019 03/15/2018 05/24/2017  Falls in the past year? 0 0 No No  Number falls in past yr: 0 0 - -  Injury with Fall? 0 - - -    Depression Screen PHQ 2/9 Scores 08/14/2019 07/17/2019 03/15/2018 05/24/2017  PHQ - 2 Score 0 0 1 0  PHQ- 9 Score - - 3 -     Cognitive Function MMSE - Mini Mental State Exam 03/15/2018  Orientation to time 5  Orientation to Place 5  Registration 3  Attention/ Calculation 5  Recall 2   Language- name 2 objects 2  Language- repeat 1  Language- follow 3 step command 3  Language- read & follow direction 1  Write a sentence 1  Copy design 1  Total score 29       Ad8 score reviewed for issues:  Issues making decisions: no  Less interest in hobbies / activities: no  Repeats questions, stories (family complaining): no  Trouble using ordinary gadgets (microwave, computer, phone):no  Forgets the month or year: no  Mismanaging finances: no  Remembering appts: no  Daily problems with thinking and/or memory: no Ad8 score is= 0  Immunization History  Administered Date(s) Administered  . Influenza, High Dose Seasonal PF 08/06/2015  . Influenza-Unspecified 09/04/2014, 09/01/2015, 09/19/2016, 09/30/2017, 08/31/2018  . Pneumococcal Conjugate-13 11/19/2015  . Pneumococcal Polysaccharide-23 12/29/2016  . Tdap 03/13/2009  . Zoster Recombinat (Shingrix) 04/06/2018, 09/08/2018   Screening Tests Health Maintenance  Topic Date Due  . TETANUS/TDAP  03/14/2019  . INFLUENZA VACCINE  07/01/2019  . COLONOSCOPY  06/10/2020  . MAMMOGRAM  01/02/2021  . DEXA SCAN  11/10/2021  . Hepatitis C Screening  Completed  . PNA vac Low Risk Adult  Completed      Plan:       Reviewed health maintenance screenings with patient today and relevant education, vaccines, and/or referrals were provided.   Continue to eat heart healthy diet (full of fruits, vegetables, whole grains, lean protein, water--limit salt, fat, and sugar intake) and increase physical activity as tolerated.  Continue doing brain stimulating activities (puzzles, reading, adult coloring books, staying active) to keep memory sharp.   I have personally reviewed and noted the following in the patient's chart:   . Medical and social history . Use of alcohol, tobacco or illicit drugs  . Current medications and supplements . Functional ability and status . Nutritional status . Physical activity . Advanced directives  . List of other physicians . Screenings to include cognitive, depression, and falls . Referrals and appointments  In addition, I have reviewed and discussed with patient certain preventive protocols, quality metrics, and best practice recommendations. A written personalized care plan for preventive services  as well as general preventive health recommendations were provided to patient.     Michiel Cowboy, RN  08/14/2019   Medical screening examination/treatment/procedure(s) were performed by non-physician practitioner and as supervising physician I was immediately available for consultation/collaboration. I agree with above. Binnie Rail, MD

## 2019-09-03 NOTE — Progress Notes (Signed)
Virtual Visit via Video Note  I connected with Deborah Hudson on 09/04/19 at  9:00 AM EDT by a video enabled telemedicine application and verified that I am speaking with the correct person using two identifiers.   I discussed the limitations of evaluation and management by telemedicine and the availability of in person appointments. The patient expressed understanding and agreed to proceed.  The patient is currently at home and I am in the office.    No referring provider.    History of Present Illness: This is an acute visit to discuss BP and muscle pain in legs at night.  Her BP spikes on a rare occasion.  She went into have stitches removed recently and her BP was 210/110 - she was not anxious.  Her blood pressure was not rechecked at that time.  She was not having any symptoms at that time-no headaches.  She has had 3 episodes of her blood pressure spiking up high.  All were when she went to a doctor's appointment.  None of those times that she have any symptoms and she did not feel anxious.  Her blood pressure is always been well controlled here.  At home her systolic BP is 123456.  Her diastolic BP has been 123456.  She is taking her medication daily as prescribed.  She was concerned about the spikes and her risk of having a stroke.  She denies taking any supplements or doing anything different the days that she did have the elevated blood pressure.   Muscle cramping at night: For several months she has been experiencing cramping or muscle pain at night.  It feels different than her typical and pain in the muscles that she had with her previous blood pressure medication.  She states it feels like her muscles are fatigued and worn out, stiff.  She is exercising regularly-walks a few miles.  She hydrates-drinks plenty of water and also drinks something that replaces her electrolytes.  The cramping/pain is worse in her legs, but does occur in her arms as well.  We did blood work in August when  she was here and there is no obvious causes and she was wondering what she can do at this point.   Review of Systems  Constitutional: Negative for fever.  Respiratory: Negative for cough, shortness of breath and wheezing.   Cardiovascular: Negative for chest pain, palpitations and leg swelling.  Neurological: Negative for dizziness and headaches.     Social History   Socioeconomic History  . Marital status: Married    Spouse name: Not on file  . Number of children: 2  . Years of education: Not on file  . Highest education level: Not on file  Occupational History  . Occupation: retired    Comment: Chief Financial Officer  Social Needs  . Financial resource strain: Not hard at all  . Food insecurity    Worry: Never true    Inability: Never true  . Transportation needs    Medical: No    Non-medical: No  Tobacco Use  . Smoking status: Never Smoker  . Smokeless tobacco: Never Used  Substance and Sexual Activity  . Alcohol use: Yes    Alcohol/week: 2.0 standard drinks    Types: 2 Glasses of wine per week    Comment: Rare  . Drug use: No  . Sexual activity: Yes  Lifestyle  . Physical activity    Days per week: 3 days    Minutes per session: 50 min  . Stress:  Not at all  Relationships  . Social connections    Talks on phone: More than three times a week    Gets together: More than three times a week    Attends religious service: More than 4 times per year    Active member of club or organization: Yes    Attends meetings of clubs or organizations: More than 4 times per year    Relationship status: Married  Other Topics Concern  . Not on file  Social History Narrative  . Not on file     Observations/Objective: Appears well in NAD   Assessment and Plan:  See Problem List for Assessment and Plan of chronic medical problems.   Follow Up Instructions:    I discussed the assessment and treatment plan with the patient. The patient was provided an opportunity  to ask questions and all were answered. The patient agreed with the plan and demonstrated an understanding of the instructions.   The patient was advised to call back or seek an in-person evaluation if the symptoms worsen or if the condition fails to improve as anticipated.    Binnie Rail, MD

## 2019-09-04 ENCOUNTER — Ambulatory Visit (INDEPENDENT_AMBULATORY_CARE_PROVIDER_SITE_OTHER): Payer: Medicare Other | Admitting: Internal Medicine

## 2019-09-04 ENCOUNTER — Encounter: Payer: Self-pay | Admitting: Internal Medicine

## 2019-09-04 DIAGNOSIS — R252 Cramp and spasm: Secondary | ICD-10-CM | POA: Diagnosis not present

## 2019-09-04 DIAGNOSIS — I10 Essential (primary) hypertension: Secondary | ICD-10-CM

## 2019-09-04 NOTE — Assessment & Plan Note (Signed)
Blood pressure typically very well controlled at home and systolic BP often on the lower side She has had 3 episodes of elevated BP-the highest being 210/110.  All of these episodes have occurred in her doctor's office.?  Possible whitecoat hypertension.  The most recent episode she did not have her blood pressure rechecked, but the previous episodes of blood pressure did not decrease after several minutes Because her blood pressure is so well controlled I do not think it is worth putting her on another medication because I think she will experience hypotension.  I agree that the spikes are concerning likely whitecoat hypertension and they are transient We could consider changing her blood pressure medication to see if that gave her better control, but I do not think that is necessary because her blood pressure is so well controlled typically Will refer to cardiology for her request for second opinion-she has seen [name] and we will see what he thinks

## 2019-09-04 NOTE — Assessment & Plan Note (Signed)
Several months of muscle cramping-legs are worse, but gets it in her arms as well Not the typical cramping, more of a pain or fatigue Drinks plenty of water, drink something with electrolytes and exercises regularly May possibly correlate to when she started trospium for her bladder-advised that she hold this for a week to see if that improves the symptoms Can consider trying over-the-counter potassium, magnesium, calcium to see if that helps May need to change blood pressure medication-the triamterene-hydrochlorothiazide could be a potential cause, but she has been on that for over a year which seems less likely

## 2019-09-18 DIAGNOSIS — Z23 Encounter for immunization: Secondary | ICD-10-CM | POA: Diagnosis not present

## 2019-09-24 NOTE — Progress Notes (Signed)
Subjective:    Patient ID: Deborah Hudson, female    DOB: 1946/02/03, 73 y.o.   MRN: [id number]  HPI She is here for pre-operative clearance at the request of Dr Wynelle Link for left hip replacement scheduled for 10/04/2019.   She denies any personal or family history of problems with anesthesia or bleeding/blood clot problems.    She has no concerns.  She is taking all his medication as prescribed.   She is exercising regularly - walking.  With her daily activities and walking she denies chest pain, palpitations, SOB and lightheadedness.        Medications and allergies reviewed with patient and updated if appropriate.  Patient Active Problem List   Diagnosis Date Noted  . Muscle cramping 07/17/2019  . Postnasal drip 09/14/2018  . GERD (gastroesophageal reflux disease) 08/02/2018  . Lumbar stenosis with neurogenic claudication 04/18/2018  . Chronic sinus infection 01/24/2018  . Lower back pain 05/24/2017  . Prediabetes 12/29/2016  . Gallstones 11/18/2015  . Essential hypertension, benign 11/18/2015  . Hypoglycemia 11/18/2015  . Urinary incontinence 11/18/2015  . Atrophy, Fuchs' 03/28/2014  . Osteoarthritis of hip 02/17/2012  . Frequent UTI 10/05/2011  . Arthritis   . Migraine headache     Current Outpatient Medications on File Prior to Visit  Medication Sig Dispense Refill  . acidophilus (RISAQUAD) CAPS capsule Take 1 capsule by mouth daily.    . Biotin (BIOTIN 5000) 5 MG CAPS Take 5 mg by mouth daily.    Azucena Freed Serrata (BOSWELLIA PO) Take 3 tablets by mouth daily as needed (bursitis).    . Carboxymethylcellul-Glycerin (LUBRICATING EYE DROPS OP) Place 1 drop into both eyes 2 (two) times daily as needed (dry eyes).     . Cranberry (ELLURA PO) Take 36 mg by mouth at bedtime.     Marland Kitchen GINKGO BILOBA PO Take 120 mg by mouth daily.     Marland Kitchen loteprednol (LOTEMAX) 0.5 % ophthalmic suspension Place 1 drop into the right eye daily.    . naproxen sodium (ALEVE) 220 MG tablet Take  220 mg by mouth at bedtime.    Marland Kitchen OVER THE COUNTER MEDICATION Take 1,000 mg by mouth at bedtime. Mannose B otc supplement     . oxybutynin (DITROPAN-XL) 10 MG 24 hr tablet Take 10 mg by mouth at bedtime.    . SUMAtriptan (IMITREX) 100 MG tablet Take 1 tablet (100 mg total) by mouth every 2 (two) hours as needed. For migraine 10 tablet 5  . triamterene-hydrochlorothiazide (MAXZIDE-25) 37.5-25 MG tablet Take 1 tablet by mouth daily. 90 tablet 1  . TURMERIC CURCUMIN PO Take 1,000 mg by mouth at bedtime.     . diclofenac sodium (VOLTAREN) 1 % GEL Apply topically 4 (four) times daily. (Patient not taking: Reported on 09/25/2019) 100 g 5  . estradiol (ESTRACE VAGINAL) 0.1 MG/GM vaginal cream Place 1 Applicatorful vaginally at bedtime. For two weeks then twice a week (Patient not taking: Reported on 09/22/2019) 42.5 g 12   No current facility-administered medications on file prior to visit.     Past Medical History:  Diagnosis Date  . Arthritis    hips  . Back pain   . Bladder infection    frequent /with Macrodantin usage  PRN  . Hyperlipidemia    borderline  . Hypertension   . Hypoglycemia    from DECREASED PROTEIN INTAKE  . Migraine headache    migraines  . Peripheral vascular disease ([place])    varicose veins/ with injections  Past Surgical History:  Procedure Laterality Date  . ANTERIOR LAT LUMBAR FUSION N/A 04/18/2018   Procedure: Lumbar Three and Lumbar Four anterior lateral decompression with lateral plate fixation;  Surgeon: Kristeen Miss, MD;  Location: [facility];  Service: Neurosurgery;  Laterality: N/A;  Lumbar Three and Lumbar Four anterior lateral decompression with lateral plate fixation  . BREAST SURGERY     bilateral breast lumpectomy  . CATARACT EXTRACTION Right 2011  . COLONOSCOPY    . CORNEAL TRANSPLANT Right 2011  . EYE SURGERY    . SHOULDER SURGERY Right 2015  . TOE SURGERY     right great toe scrapping for arthritis  . TOTAL HIP ARTHROPLASTY  02/17/2012    Procedure: TOTAL HIP ARTHROPLASTY;  Surgeon: Gearlean Alf, MD;  Location: [facility];  Service: Orthopedics;  Laterality: Right;    Social History   Socioeconomic History  . Marital status: Married    Spouse name: Not on file  . Number of children: 2  . Years of education: Not on file  . Highest education level: Not on file  Occupational History  . Occupation: retired    Comment: Chief Financial Officer  Social Needs  . Financial resource strain: Not hard at all  . Food insecurity    Worry: Never true    Inability: Never true  . Transportation needs    Medical: No    Non-medical: No  Tobacco Use  . Smoking status: Never Smoker  . Smokeless tobacco: Never Used  Substance and Sexual Activity  . Alcohol use: Yes    Alcohol/week: 2.0 standard drinks    Types: 2 Glasses of wine per week    Comment: Rare  . Drug use: No  . Sexual activity: Yes  Lifestyle  . Physical activity    Days per week: 3 days    Minutes per session: 50 min  . Stress: Not at all  Relationships  . Social connections    Talks on phone: More than three times a week    Gets together: More than three times a week    Attends religious service: More than 4 times per year    Active member of club or organization: Yes    Attends meetings of clubs or organizations: More than 4 times per year    Relationship status: Married  Other Topics Concern  . Not on file  Social History Narrative  . Not on file    Family History  Problem Relation Age of Onset  . Cancer Mother        lung  . Alzheimer's disease Mother   . Depression Mother   . Colon polyps Mother   . Colon cancer Mother   . Stroke Father   . Cancer Maternal Grandfather        lung  . Stroke Maternal Grandmother     Review of Systems  Constitutional: Negative for chills and fever.  Eyes: Negative for visual disturbance.  Respiratory: Negative for cough, shortness of breath and wheezing.   Cardiovascular: Negative for chest pain and  palpitations.  Gastrointestinal: Negative for abdominal pain, blood in stool, diarrhea and nausea.  Genitourinary: Negative for dysuria and hematuria.  Musculoskeletal: Positive for arthralgias.  Neurological: Positive for headaches (chronic, no change). Negative for dizziness and light-headedness.       Objective:   Vitals:   09/25/19 0955  BP: 122/76  Pulse: 73  Temp: 98 F (36.7 C)  SpO2: 97%   Filed Weights   09/25/19 0955  Weight: 190 lb (86.2 kg)   Body mass index is 28.06 kg/m.  BP Readings from Last 3 Encounters:  09/25/19 122/76  07/17/19 122/80  01/23/19 117/62    Wt Readings from Last 3 Encounters:  09/25/19 190 lb (86.2 kg)  07/17/19 188 lb (85.3 kg)  01/23/19 187 lb (84.8 kg)     Physical Exam Constitutional: She appears well-developed and well-nourished. No distress.  HENT:  Head: Normocephalic and atraumatic.  Right Ear: External ear normal. Normal ear canal and TM Left Ear: External ear normal.  Normal ear canal and TM Mouth/Throat: Oropharynx is clear and moist.  Eyes: Conjunctivae and EOM are normal.  Neck: Neck supple. No tracheal deviation present. No thyromegaly present. No carotid bruit  Cardiovascular: Normal rate, regular rhythm and normal heart sounds.  No murmur heard.  No edema. Pulmonary/Chest: Effort normal and breath sounds normal. No respiratory distress. She has no wheezes. She has no rales.  Abdominal: Soft. She exhibits no distension. There is no tenderness.  Lymphadenopathy: She has no cervical adenopathy.  Skin: Skin is warm and dry. She is not diaphoretic.  Psychiatric: She has a normal mood and affect. Her behavior is normal.        Assessment & Plan:    See Problem List for Assessment and Plan of chronic medical problems.

## 2019-09-25 ENCOUNTER — Ambulatory Visit (INDEPENDENT_AMBULATORY_CARE_PROVIDER_SITE_OTHER): Payer: Medicare Other | Admitting: Internal Medicine

## 2019-09-25 ENCOUNTER — Other Ambulatory Visit: Payer: Self-pay

## 2019-09-25 ENCOUNTER — Encounter: Payer: Self-pay | Admitting: Internal Medicine

## 2019-09-25 VITALS — BP 122/76 | HR 73 | Temp 98.0°F | Ht 69.0 in | Wt 190.0 lb

## 2019-09-25 DIAGNOSIS — I10 Essential (primary) hypertension: Secondary | ICD-10-CM | POA: Diagnosis not present

## 2019-09-25 DIAGNOSIS — Z01818 Encounter for other preprocedural examination: Secondary | ICD-10-CM

## 2019-09-25 DIAGNOSIS — G43009 Migraine without aura, not intractable, without status migrainosus: Secondary | ICD-10-CM

## 2019-09-25 MED ORDER — SUMATRIPTAN SUCCINATE 100 MG PO TABS: 100.0000 mg | ORAL_TABLET | ORAL | 5 refills | Status: AC | PRN

## 2019-09-25 NOTE — Assessment & Plan Note (Signed)
Chronic medical problems stable and well controlled No CAD, chronic lung disease and no symptoms concerning for either No bleeding/blood clot or issues with anesthesia EKG today:  Sinus at 62 bmp, normal EKG.  T wave abn present in 2018 no longer present, no other changes compared to EKG from 2018.  No blood work needed - reviewed labs from August.  Low risk for low risk surgery - cleared medically

## 2019-09-25 NOTE — Assessment & Plan Note (Signed)
BP well controlled Current regimen effective and well tolerated Continue current medications at current doses  

## 2019-09-25 NOTE — Patient Instructions (Addendum)
You had an EKG done today.    You are good for surgery.  We will let Dr Wynelle Link know.  Please call if you have any questions or concerns.

## 2019-09-25 NOTE — Assessment & Plan Note (Signed)
Has frequent headaches Taking imitrex prn Needs refill of imitrex - sent to [facility]

## 2019-09-27 NOTE — Patient Instructions (Addendum)
DUE TO COVID-19 ONLY ONE VISITOR IS ALLOWED TO COME WITH YOU AND STAY IN THE WAITING ROOM ONLY DURING PRE OP AND PROCEDURE DAY OF SURGERY. THE 1 VISITOR MAY VISIT WITH YOU AFTER SURGERY IN YOUR PRIVATE ROOM DURING VISITING HOURS ONLY!  YOU NEED TO HAVE A COVID 19 TEST ON__10/31_____ @_9 :45______, THIS TEST MUST BE DONE BEFORE SURGERY, COME  [place] , [id number].  [facility]) go to the cement overhang.  ONCE YOUR COVID TEST IS COMPLETED, PLEASE BEGIN THE QUARANTINE INSTRUCTIONS AS OUTLINED IN YOUR HANDOUT.                ZYLA ZARZA   Your procedure is scheduled on: 10/04/19   Report to [facility] Main  Entrance   Report to admitting at  8:00 AM     Call this number if you have problems the morning of surgery [phone]    . BRUSH YOUR TEETH MORNING OF SURGERY AND RINSE YOUR MOUTH OUT, NO CHEWING GUM CANDY OR MINTS.    Do not eat food After Midnight.   YOU MAY HAVE CLEAR LIQUIDS FROM MIDNIGHT UNTIL 7:30AM.   At 7:30AM Please finish the prescribed Pre-Surgery  drink  . Nothing by mouth after you finish the  drink !   Take these medicines the morning of surgery with A SIP OF WATER: Imitrex if needed. None other meds                                 You may not have any metal on your body including hair pins and              piercings              Do not wear jewelry, make-up, lotions, powders or perfumes, deodorant             Do not wear nail polish on your fingernails.  Do not shave  48 hours prior to surgery.             Do not bring valuables to the hospital. [place].  Contacts, dentures or bridgework may not be worn into surgery.     Name and phone number of your driver:  Special Instructions: N/A              Please read over the following fact sheets you were given: _____________________________________________________________________             [facility] - Preparing for Surgery  Before surgery, you can play an important role.   Because skin is not sterile, your skin needs to be as free of germs as possible.   You can reduce the number of germs on your skin by washing with CHG (chlorahexidine gluconate) soap before surgery.   CHG is an antiseptic cleaner which kills germs and bonds with the skin to continue killing germs even after washing. Please DO NOT use if you have an allergy to CHG or antibacterial soaps.   If your skin becomes reddened/irritated stop using the CHG and inform your nurse when you arrive at Short Stay. Do not shave (including legs and underarms) for at least 48 hours prior to the first CHG shower. . Please follow these instructions carefully:  1.  Shower with CHG Soap  the night before surgery and the  morning of Surgery.  2.  If you choose to wash your hair, wash your hair first as usual with your  normal  shampoo.  3.  After you shampoo, rinse your hair and body thoroughly to remove the  shampoo.                                        4.  Use CHG as you would any other liquid soap.  You can apply chg directly  to the skin and wash                       Gently with a scrungie or clean washcloth.  5.  Apply the CHG Soap to your body ONLY FROM THE NECK DOWN.   Do not use on face/ open                           Wound or open sores. Avoid contact with eyes, ears mouth and genitals (private parts).                       Wash face,  Genitals (private parts) with your normal soap.             6.  Wash thoroughly, paying special attention to the area where your surgery  will be performed.  7.  Thoroughly rinse your body with warm water from the neck down.  8.  DO NOT shower/wash with your normal soap after using and rinsing off  the CHG Soap.             9.  Pat yourself dry with a clean towel.            10.  Wear clean pajamas.            11.  Place clean sheets on your bed the night of your first shower and do not  sleep  with pets. Day of Surgery : Do not apply any lotions/deodorants the morning of surgery.  Please wear clean clothes to the hospital/surgery center.  FAILURE TO FOLLOW THESE INSTRUCTIONS MAY RESULT IN THE CANCELLATION OF YOUR SURGERY PATIENT SIGNATURE_________________________________  NURSE SIGNATURE__________________________________  ________________________________________________________________________   Adam Phenix  An incentive spirometer is a tool that can help keep your lungs clear and active. This tool measures how well you are filling your lungs with each breath. Taking long deep breaths may help reverse or decrease the chance of developing breathing (pulmonary) problems (especially infection) following:  A long period of time when you are unable to move or be active. BEFORE THE PROCEDURE   If the spirometer includes an indicator to show your best effort, your nurse or respiratory therapist will set it to a desired goal.  If possible, sit up straight or lean slightly forward. Try not to slouch.  Hold the incentive spirometer in an upright position. INSTRUCTIONS FOR USE  1. Sit on the edge of your bed if possible, or sit up as far as you can in bed or on a chair. 2. Hold the incentive spirometer in an upright position. 3. Breathe out normally. 4. Place the mouthpiece in your mouth and seal your lips tightly around it. 5. Breathe in slowly and as deeply as possible, raising the piston or the ball toward the  top of the column. 6. Hold your breath for 3-5 seconds or for as long as possible. Allow the piston or ball to fall to the bottom of the column. 7. Remove the mouthpiece from your mouth and breathe out normally. 8. Rest for a few seconds and repeat Steps 1 through 7 at least 10 times every 1-2 hours when you are awake. Take your time and take a few normal breaths between deep breaths. 9. The spirometer may include an indicator to show your best effort. Use the  indicator as a goal to work toward during each repetition. 10. After each set of 10 deep breaths, practice coughing to be sure your lungs are clear. If you have an incision (the cut made at the time of surgery), support your incision when coughing by placing a pillow or rolled up towels firmly against it. Once you are able to get out of bed, walk around indoors and cough well. You may stop using the incentive spirometer when instructed by your caregiver.  RISKS AND COMPLICATIONS  Take your time so you do not get dizzy or light-headed.  If you are in pain, you may need to take or ask for pain medication before doing incentive spirometry. It is harder to take a deep breath if you are having pain. AFTER USE  Rest and breathe slowly and easily.  It can be helpful to keep track of a log of your progress. Your caregiver can provide you with a simple table to help with this. If you are using the spirometer at home, follow these instructions: [place] IF:   You are having difficultly using the spirometer.  You have trouble using the spirometer as often as instructed.  Your pain medication is not giving enough relief while using the spirometer.  You develop fever of 100.5 F (38.1 C) or higher. SEEK IMMEDIATE MEDICAL CARE IF:   You cough up bloody sputum that had not been present before.  You develop fever of 102 F (38.9 C) or greater.  You develop worsening pain at or near the incision site. MAKE SURE YOU:   Understand these instructions.  Will watch your condition.  Will get help right away if you are not doing well or get worse. Document Released: 03/29/2007 Document Revised: 02/08/2012 Document Reviewed: 05/30/2007 ExitCare Patient Information 2014 ExitCare, [place].   ________________________________________________________________________  WHAT IS A BLOOD TRANSFUSION? Blood Transfusion Information  A transfusion is the replacement of blood or some of its parts.  Blood is made up of multiple cells which provide different functions.  Red blood cells carry oxygen and are used for blood loss replacement.  White blood cells fight against infection.  Platelets control bleeding.  Plasma helps clot blood.  Other blood products are available for specialized needs, such as hemophilia or other clotting disorders. BEFORE THE TRANSFUSION  Who gives blood for transfusions?   Healthy volunteers who are fully evaluated to make sure their blood is safe. This is blood bank blood. Transfusion therapy is the safest it has ever been in the practice of medicine. Before blood is taken from a donor, a complete history is taken to make sure that person has no history of diseases nor engages in risky social behavior (examples are intravenous drug use or sexual activity with multiple partners). The donor's travel history is screened to minimize risk of transmitting infections, such as malaria. The donated blood is tested for signs of infectious diseases, such as HIV and hepatitis. The blood is then tested  to be sure it is compatible with you in order to minimize the chance of a transfusion reaction. If you or a relative donates blood, this is often done in anticipation of surgery and is not appropriate for emergency situations. It takes many days to process the donated blood. RISKS AND COMPLICATIONS Although transfusion therapy is very safe and saves many lives, the main dangers of transfusion include:   Getting an infectious disease.  Developing a transfusion reaction. This is an allergic reaction to something in the blood you were given. Every precaution is taken to prevent this. The decision to have a blood transfusion has been considered carefully by your caregiver before blood is given. Blood is not given unless the benefits outweigh the risks. AFTER THE TRANSFUSION  Right after receiving a blood transfusion, you will usually feel much better and more energetic. This is  especially true if your red blood cells have gotten low (anemic). The transfusion raises the level of the red blood cells which carry oxygen, and this usually causes an energy increase.  The nurse administering the transfusion will monitor you carefully for complications. HOME CARE INSTRUCTIONS  No special instructions are needed after a transfusion. You may find your energy is better. Speak with your caregiver about any limitations on activity for underlying diseases you may have. SEEK MEDICAL CARE IF:   Your condition is not improving after your transfusion.  You develop redness or irritation at the intravenous (IV) site. SEEK IMMEDIATE MEDICAL CARE IF:  Any of the following symptoms occur over the next 12 hours:  Shaking chills.  You have a temperature by mouth above 102 F (38.9 C), not controlled by medicine.  Chest, back, or muscle pain.  People around you feel you are not acting correctly or are confused.  Shortness of breath or difficulty breathing.  Dizziness and fainting.  You get a rash or develop hives.  You have a decrease in urine output.  Your urine turns a dark color or changes to pink, red, or brown. Any of the following symptoms occur over the next 10 days:  You have a temperature by mouth above 102 F (38.9 C), not controlled by medicine.  Shortness of breath.  Weakness after normal activity.  The white part of the eye turns yellow (jaundice).  You have a decrease in the amount of urine or are urinating less often.  Your urine turns a dark color or changes to pink, red, or brown. Document Released: 11/13/2000 Document Revised: 02/08/2012 Document Reviewed: 07/02/2008 [facility] Patient Information 2014 [place].

## 2019-09-28 ENCOUNTER — Encounter (HOSPITAL_COMMUNITY): Payer: Self-pay

## 2019-09-28 ENCOUNTER — Other Ambulatory Visit: Payer: Self-pay

## 2019-09-28 ENCOUNTER — Encounter (HOSPITAL_COMMUNITY)
Admission: RE | Admit: 2019-09-28 | Discharge: 2019-09-28 | Disposition: A | Discharge status: Home / Self Care | Payer: Medicare Other | Source: Ambulatory Visit | Attending: Orthopedic Surgery | Admitting: Orthopedic Surgery

## 2019-09-28 DIAGNOSIS — M1612 Unilateral primary osteoarthritis, left hip: Secondary | ICD-10-CM | POA: Insufficient documentation

## 2019-09-28 DIAGNOSIS — Z01812 Encounter for preprocedural laboratory examination: Secondary | ICD-10-CM | POA: Insufficient documentation

## 2019-09-28 LAB — COMPREHENSIVE METABOLIC PANEL
ALT: 16 U/L (ref 0–44)
AST: 17 U/L (ref 15–41)
Albumin: 4 g/dL (ref 3.5–5.0)
Alkaline Phosphatase: 53 U/L (ref 38–126)
Anion gap: 8 (ref 5–15)
BUN: 13 mg/dL (ref 8–23)
CO2: 26 mmol/L (ref 22–32)
Calcium: 9.2 mg/dL (ref 8.9–10.3)
Chloride: 99 mmol/L (ref 98–111)
Creatinine, Ser: 0.83 mg/dL (ref 0.44–1.00)
GFR calc Af Amer: >60 mL/min (ref >60)
GFR calc non Af Amer: >60 mL/min (ref >60)
Glucose, Bld: 104 mg/dL — ABNORMAL HIGH (ref 70–99)
Potassium: 4.2 mmol/L (ref 3.5–5.1)
Sodium: 133 mmol/L — ABNORMAL LOW (ref 135–145)
Total Bilirubin: 1 mg/dL (ref 0.3–1.2)
Total Protein: 6.9 g/dL (ref 6.5–8.1)

## 2019-09-28 LAB — CBC
HCT: 43.5 % (ref 36.0–46.0)
Hemoglobin: 14.5 g/dL (ref 12.0–15.0)
MCH: 31 pg (ref 26.0–34.0)
MCHC: 33.3 g/dL (ref 30.0–36.0)
MCV: 93.1 fL (ref 80.0–100.0)
Platelets: 295 10*3/uL (ref 150–400)
RBC: 4.67 MIL/uL (ref 3.87–5.11)
RDW: 12.3 % (ref 11.5–15.5)
WBC: 7.9 10*3/uL (ref 4.0–10.5)
nRBC: 0 % (ref 0.0–0.2)

## 2019-09-28 LAB — PROTIME-INR
INR: 1 (ref 0.8–1.2)
Prothrombin Time: 12.9 seconds (ref 11.4–15.2)

## 2019-09-28 LAB — APTT: aPTT: 29 seconds (ref 24–36)

## 2019-09-28 LAB — SURGICAL PCR SCREEN
MRSA, PCR: NEGATIVE
Staphylococcus aureus: NEGATIVE

## 2019-09-28 NOTE — Progress Notes (Signed)
PCP - S. Burns Cardiologist - [name]  Chest x-ray - no EKG - 09/25/19 Stress Test -  ECHO - 12/06/17 Cardiac Cath - no  Sleep Study - no CPAP -   Fasting Blood Sugar - NA Checks Blood Sugar _____ times a day  Blood Thinner Instructions:NA Aspirin Instructions: Last Dose:  Anesthesia review:   Patient denies shortness of breath, fever, cough and chest pain at PAT appointment yes  Patient verbalized understanding of instructions that were given to them at the PAT appointment. Patient was also instructed that they will need to review over the PAT instructions again at home before surgery. yes

## 2019-09-30 ENCOUNTER — Other Ambulatory Visit (HOSPITAL_COMMUNITY)
Admission: RE | Admit: 2019-09-30 | Discharge: 2019-09-30 | Disposition: A | Discharge status: Home / Self Care | Payer: Medicare Other | Source: Ambulatory Visit | Attending: Orthopedic Surgery | Admitting: Orthopedic Surgery

## 2019-09-30 DIAGNOSIS — Z01812 Encounter for preprocedural laboratory examination: Secondary | ICD-10-CM | POA: Insufficient documentation

## 2019-09-30 DIAGNOSIS — Z20828 Contact with and (suspected) exposure to other viral communicable diseases: Secondary | ICD-10-CM | POA: Diagnosis not present

## 2019-10-01 LAB — NOVEL CORONAVIRUS, NAA (HOSP ORDER, SEND-OUT TO REF LAB; TAT 18-24 HRS): SARS-CoV-2, NAA: NOT DETECTED

## 2019-10-02 NOTE — H&P (Signed)
TOTAL HIP ADMISSION H&P  Patient is admitted for left total hip arthroplasty.  Subjective:  Chief Complaint: left hip pain  HPI: Deborah Hudson, 73 y.o. female, has a history of pain and functional disability in the left hip(s) due to arthritis and patient has failed non-surgical conservative treatments for greater than 12 weeks to include NSAID's and/or analgesics, corticosteriod injections, flexibility and strengthening excercises and activity modification.  Onset of symptoms was gradual starting 3 years ago with gradually worsening course since that time.The patient noted no past surgery on the left hip(s).  Patient currently rates pain in the left hip at 4 out of 10 with activity. Patient has night pain, worsening of pain with activity and weight bearing, pain that interfers with activities of daily living and pain with passive range of motion. Patient has evidence of periarticular osteophytes and joint space narrowing by imaging studies. This condition presents safety issues increasing the risk of falls.  There is no current active infection.  Patient Active Problem List   Diagnosis Date Noted  . Pre-operative clearance 09/25/2019  . Muscle cramping 07/17/2019  . Postnasal drip 09/14/2018  . GERD (gastroesophageal reflux disease) 08/02/2018  . Lumbar stenosis with neurogenic claudication 04/18/2018  . Chronic sinus infection 01/24/2018  . Lower back pain 05/24/2017  . Prediabetes 12/29/2016  . Gallstones 11/18/2015  . Essential hypertension, benign 11/18/2015  . Hypoglycemia 11/18/2015  . Urinary incontinence 11/18/2015  . Atrophy, Fuchs' 03/28/2014  . Osteoarthritis of hip 02/17/2012  . Frequent UTI 10/05/2011  . Arthritis   . Migraine headache    Past Medical History:  Diagnosis Date  . Arthritis    hips  . Back pain   . Bladder infection    frequent /with Macrodantin usage  PRN  . Hyperlipidemia    borderline  . Hypertension   . Hypoglycemia    from DECREASED  PROTEIN INTAKE  . Migraine headache    migraines  . Peripheral vascular disease ([place])    varicose veins/ with injections    Past Surgical History:  Procedure Laterality Date  . ANTERIOR LAT LUMBAR FUSION N/A 04/18/2018   Procedure: Lumbar Three and Lumbar Four anterior lateral decompression with lateral plate fixation;  Surgeon: Kristeen Miss, MD;  Location: [place];  Service: Neurosurgery;  Laterality: N/A;  Lumbar Three and Lumbar Four anterior lateral decompression with lateral plate fixation  . BREAST SURGERY     bilateral breast lumpectomy  . CATARACT EXTRACTION Right 2011  . COLONOSCOPY    . CORNEAL TRANSPLANT Right 2011  . EYE SURGERY    . SHOULDER SURGERY Right 2015  . TOE SURGERY     right great toe scrapping for arthritis  . TOTAL HIP ARTHROPLASTY  02/17/2012   Procedure: TOTAL HIP ARTHROPLASTY;  Surgeon: Gearlean Alf, MD;  Location: [facility];  Service: Orthopedics;  Laterality: Right;        Current Outpatient Medications  Medication Sig Dispense Refill Last Dose  . acidophilus (RISAQUAD) CAPS capsule Take 1 capsule by mouth daily.     . Biotin (BIOTIN 5000) 5 MG CAPS Take 5 mg by mouth daily.     Azucena Freed Serrata (BOSWELLIA PO) Take 3 tablets by mouth daily as needed (bursitis).     . Carboxymethylcellul-Glycerin (LUBRICATING EYE DROPS OP) Place 1 drop into both eyes 2 (two) times daily as needed (dry eyes).      . Cranberry (ELLURA PO) Take 36 mg by mouth at bedtime.      . diclofenac  sodium (VOLTAREN) 1 % GEL Apply topically 4 (four) times daily. (Patient not taking: Reported on 09/25/2019) 100 g 5   . GINKGO BILOBA PO Take 120 mg by mouth daily.      Marland Kitchen loteprednol (LOTEMAX) 0.5 % ophthalmic suspension Place 1 drop into the right eye daily.     . naproxen sodium (ALEVE) 220 MG tablet Take 220 mg by mouth at bedtime.     Marland Kitchen OVER THE COUNTER MEDICATION Take 1,000 mg by mouth at bedtime. Mannose B otc supplement      . oxybutynin (DITROPAN-XL) 10 MG 24 hr tablet Take 10  mg by mouth at bedtime.     . triamterene-hydrochlorothiazide (MAXZIDE-25) 37.5-25 MG tablet Take 1 tablet by mouth daily. 90 tablet 1   . TURMERIC CURCUMIN PO Take 1,000 mg by mouth at bedtime.      Marland Kitchen estradiol (ESTRACE VAGINAL) 0.1 MG/GM vaginal cream Place 1 Applicatorful vaginally at bedtime. For two weeks then twice a week (Patient not taking: Reported on 09/22/2019) 42.5 g 12 Not Taking at Unknown time  . SUMAtriptan (IMITREX) 100 MG tablet Take 1 tablet (100 mg total) by mouth every 2 (two) hours as needed. For migraine 10 tablet 5    Allergies  Allergen Reactions  . Sulfa Antibiotics Itching  . Adhesive [Tape] Rash    Rash at the site    Social History   Tobacco Use  . Smoking status: Never Smoker  . Smokeless tobacco: Never Used  Substance Use Topics  . Alcohol use: Yes    Alcohol/week: 2.0 standard drinks    Types: 2 Glasses of wine per week    Comment: Rare    Family History  Problem Relation Age of Onset  . Cancer Mother        lung  . Alzheimer's disease Mother   . Depression Mother   . Colon polyps Mother   . Colon cancer Mother   . Stroke Father   . Cancer Maternal Grandfather        lung  . Stroke Maternal Grandmother      Review of Systems  Constitutional: Negative.   HENT: Negative.   Eyes: Negative.   Respiratory: Negative.   Cardiovascular: Negative.   Gastrointestinal: Negative.   Genitourinary: Negative.   Musculoskeletal: Positive for joint pain and myalgias. Negative for back pain, falls and neck pain.  Skin: Negative.   Neurological: Negative.   Endo/Heme/Allergies: Negative.   Psychiatric/Behavioral: Negative.     Objective:  Physical Exam  Constitutional: She is oriented to person, place, and time. She appears well-developed. No distress.  Overweight  HENT:  Head: Normocephalic and atraumatic.  Right Ear: External ear normal.  Left Ear: External ear normal.  Nose: Nose normal.  Mouth/Throat: Oropharynx is clear and moist.   Eyes: Conjunctivae and EOM are normal.  Neck: Normal range of motion. Neck supple.  Cardiovascular: Normal rate, regular rhythm, normal heart sounds and intact distal pulses.  No murmur heard. Respiratory: Effort normal and breath sounds normal. No respiratory distress. She has no wheezes.  GI: Soft. Bowel sounds are normal. She exhibits no distension. There is no abdominal tenderness.  Musculoskeletal:     Comments: The patient has an antalgic gait on the left and ambulates independently.  Right Hip Exam: The range of motion: Flexion to 130 degrees, Internal Rotation to 30 degrees, External Rotation to 40 degrees, and abduction to 40 degrees without discomfort. There is no tenderness over the greater trochanteric bursa.  Left Hip Exam:  The range of motion: Flexion to 120 degrees, Internal Rotation is minimal, External Rotation to 20 degrees, and abduction to 40 degrees without discomfort. There is no tenderness over the greater trochanteric bursa.  Neurological: She is alert and oriented to person, place, and time. She has normal strength. No sensory deficit.  Skin: No rash noted. She is not diaphoretic. No erythema.  Psychiatric: She has a normal mood and affect. Her behavior is normal.    Vital Signs Ht: 5 ft 9 in  Wt: 189.4 lbs  BMI: 28  BP: 128/82 sitting L arm  Pulse: 72 bpm   Imaging Review Plain radiographs demonstrate severe degenerative joint disease of the left hip(s). The bone quality appears to be good for age and reported activity level.    Assessment/Plan:  End stage primary osteoarthritis, left hip(s)  The patient history, physical examination, clinical judgement of the provider and imaging studies are consistent with end stage degenerative joint disease of the left hip(s) and total hip arthroplasty is deemed medically necessary. The treatment options including medical management, injection therapy, arthroscopy and arthroplasty were discussed at length. The  risks and benefits of total hip arthroplasty were presented and reviewed. The risks due to aseptic loosening, infection, stiffness, dislocation/subluxation,  thromboembolic complications and other imponderables were discussed.  The patient acknowledged the explanation, agreed to proceed with the plan and consent was signed. Patient is being admitted for inpatient treatment for surgery, pain control, PT, OT, prophylactic antibiotics, VTE prophylaxis, progressive ambulation and ADL's and discharge planning.The patient is planning to be discharged home with HEP.    Risks and benefits of the surgery were discussed with the patient and Dr.Aluisio at their previous office visit, and the patient has elected to move forward with the aforementioned surgery. Post-operative care plans were discussed with the patient today.  Therapy Plans:HEP Disposition:Home with husband Planned DVT prophylaxis:aspirin 325mg  BID DME needed:none needed PCP: Dr. Billey Gosling- clearance received Other: no anesthesia concerns  Instructed patient on meds to stop prior to surgery  Ardeen Jourdain, PA-C

## 2019-10-03 ENCOUNTER — Encounter (HOSPITAL_COMMUNITY): Payer: Self-pay | Admitting: Anesthesiology

## 2019-10-04 ENCOUNTER — Inpatient Hospital Stay (HOSPITAL_COMMUNITY): Payer: Medicare Other | Admitting: Anesthesiology

## 2019-10-04 ENCOUNTER — Inpatient Hospital Stay (HOSPITAL_COMMUNITY): Payer: Medicare Other

## 2019-10-04 ENCOUNTER — Encounter (HOSPITAL_COMMUNITY)
Admission: RE | Disposition: A | Discharge status: Home / Self Care | Payer: Self-pay | Source: Home / Self Care | Attending: Orthopedic Surgery

## 2019-10-04 ENCOUNTER — Encounter (HOSPITAL_COMMUNITY): Payer: Self-pay | Admitting: Anesthesiology

## 2019-10-04 ENCOUNTER — Observation Stay (HOSPITAL_COMMUNITY)
Admission: RE | Admit: 2019-10-04 | Discharge: 2019-10-05 | Disposition: A | Discharge status: Home / Self Care | Payer: Medicare Other | Attending: Orthopedic Surgery | Admitting: Orthopedic Surgery

## 2019-10-04 ENCOUNTER — Other Ambulatory Visit: Payer: Self-pay

## 2019-10-04 DIAGNOSIS — Z96649 Presence of unspecified artificial hip joint: Secondary | ICD-10-CM

## 2019-10-04 DIAGNOSIS — M25752 Osteophyte, left hip: Secondary | ICD-10-CM | POA: Insufficient documentation

## 2019-10-04 DIAGNOSIS — I739 Peripheral vascular disease, unspecified: Secondary | ICD-10-CM | POA: Diagnosis not present

## 2019-10-04 DIAGNOSIS — Z882 Allergy status to sulfonamides status: Secondary | ICD-10-CM | POA: Insufficient documentation

## 2019-10-04 DIAGNOSIS — Z888 Allergy status to other drugs, medicaments and biological substances status: Secondary | ICD-10-CM | POA: Insufficient documentation

## 2019-10-04 DIAGNOSIS — I10 Essential (primary) hypertension: Secondary | ICD-10-CM | POA: Insufficient documentation

## 2019-10-04 DIAGNOSIS — Z96642 Presence of left artificial hip joint: Secondary | ICD-10-CM | POA: Diagnosis not present

## 2019-10-04 DIAGNOSIS — G43909 Migraine, unspecified, not intractable, without status migrainosus: Secondary | ICD-10-CM | POA: Diagnosis not present

## 2019-10-04 DIAGNOSIS — E162 Hypoglycemia, unspecified: Secondary | ICD-10-CM | POA: Diagnosis not present

## 2019-10-04 DIAGNOSIS — M1612 Unilateral primary osteoarthritis, left hip: Principal | ICD-10-CM | POA: Insufficient documentation

## 2019-10-04 DIAGNOSIS — K219 Gastro-esophageal reflux disease without esophagitis: Secondary | ICD-10-CM | POA: Diagnosis not present

## 2019-10-04 DIAGNOSIS — Z79899 Other long term (current) drug therapy: Secondary | ICD-10-CM | POA: Diagnosis not present

## 2019-10-04 DIAGNOSIS — M169 Osteoarthritis of hip, unspecified: Secondary | ICD-10-CM | POA: Diagnosis present

## 2019-10-04 DIAGNOSIS — Z471 Aftercare following joint replacement surgery: Secondary | ICD-10-CM | POA: Diagnosis not present

## 2019-10-04 DIAGNOSIS — Z419 Encounter for procedure for purposes other than remedying health state, unspecified: Secondary | ICD-10-CM

## 2019-10-04 HISTORY — PX: TOTAL HIP ARTHROPLASTY: SHX124

## 2019-10-04 LAB — TYPE AND SCREEN
ABO/RH(D): B POS
Antibody Screen: NEGATIVE

## 2019-10-04 SURGERY — ARTHROPLASTY, HIP, TOTAL, ANTERIOR APPROACH
Anesthesia: Spinal | Site: Hip | Laterality: Left

## 2019-10-04 MED ORDER — LACTATED RINGERS IV SOLN
INTRAVENOUS | Status: DC
  Administered 2019-10-04 (×2): via INTRAVENOUS

## 2019-10-04 MED ORDER — FENTANYL CITRATE (PF) 100 MCG/2ML IJ SOLN: 25.0000 ug | INTRAMUSCULAR | Status: DC | PRN

## 2019-10-04 MED ORDER — DEXAMETHASONE SODIUM PHOSPHATE 10 MG/ML IJ SOLN
10.0000 mg | Freq: Once | INTRAMUSCULAR | Status: AC
  Administered 2019-10-05: 10 mg via INTRAVENOUS
  Filled 2019-10-04: qty 1

## 2019-10-04 MED ORDER — PHENYLEPHRINE HCL-NACL 10-0.9 MG/250ML-% IV SOLN
INTRAVENOUS | Status: DC | PRN
  Administered 2019-10-04: 15 ug/min via INTRAVENOUS

## 2019-10-04 MED ORDER — TRAMADOL HCL 50 MG PO TABS: 50.0000 mg | ORAL_TABLET | Freq: Four times a day (QID) | ORAL | Status: DC | PRN

## 2019-10-04 MED ORDER — FENTANYL CITRATE (PF) 100 MCG/2ML IJ SOLN
INTRAMUSCULAR | Status: AC
  Filled 2019-10-04: qty 2

## 2019-10-04 MED ORDER — FENTANYL CITRATE (PF) 100 MCG/2ML IJ SOLN
INTRAMUSCULAR | Status: DC | PRN
  Administered 2019-10-04: 50 ug via INTRAVENOUS
  Administered 2019-10-04 (×2): 25 ug via INTRAVENOUS

## 2019-10-04 MED ORDER — BUPIVACAINE-EPINEPHRINE 0.25% -1:200000 IJ SOLN
INTRAMUSCULAR | Status: AC
  Filled 2019-10-04: qty 1

## 2019-10-04 MED ORDER — ACETAMINOPHEN 500 MG PO TABS
500.0000 mg | ORAL_TABLET | Freq: Four times a day (QID) | ORAL | Status: DC
  Administered 2019-10-04 – 2019-10-05 (×2): 500 mg via ORAL
  Filled 2019-10-04 (×2): qty 1

## 2019-10-04 MED ORDER — 0.9 % SODIUM CHLORIDE (POUR BTL) OPTIME
TOPICAL | Status: DC | PRN
  Administered 2019-10-04: 1000 mL

## 2019-10-04 MED ORDER — PHENOL 1.4 % MT LIQD: 1.0000 | OROMUCOSAL | Status: DC | PRN

## 2019-10-04 MED ORDER — METOCLOPRAMIDE HCL 5 MG/ML IJ SOLN: 5.0000 mg | Freq: Three times a day (TID) | INTRAMUSCULAR | Status: DC | PRN

## 2019-10-04 MED ORDER — MORPHINE SULFATE (PF) 2 MG/ML IV SOLN: 0.5000 mg | INTRAVENOUS | Status: DC | PRN

## 2019-10-04 MED ORDER — PROPOFOL 10 MG/ML IV BOLUS
INTRAVENOUS | Status: AC
  Filled 2019-10-04: qty 20

## 2019-10-04 MED ORDER — SUMATRIPTAN SUCCINATE 50 MG PO TABS
100.0000 mg | ORAL_TABLET | ORAL | Status: DC | PRN
  Filled 2019-10-04: qty 2

## 2019-10-04 MED ORDER — HYDROCODONE-ACETAMINOPHEN 5-325 MG PO TABS
1.0000 | ORAL_TABLET | ORAL | Status: DC | PRN
  Administered 2019-10-04 (×2): 2 via ORAL
  Administered 2019-10-05: 1 via ORAL
  Administered 2019-10-05 (×2): 2 via ORAL
  Filled 2019-10-04: qty 2
  Filled 2019-10-04: qty 1
  Filled 2019-10-04 (×3): qty 2

## 2019-10-04 MED ORDER — CEFAZOLIN SODIUM-DEXTROSE 2-4 GM/100ML-% IV SOLN
2.0000 g | INTRAVENOUS | Status: AC
  Administered 2019-10-04: 2 g via INTRAVENOUS
  Filled 2019-10-04: qty 100

## 2019-10-04 MED ORDER — PROPOFOL 500 MG/50ML IV EMUL
INTRAVENOUS | Status: DC | PRN
  Administered 2019-10-04: 65 ug/kg/min via INTRAVENOUS

## 2019-10-04 MED ORDER — CHLORHEXIDINE GLUCONATE 4 % EX LIQD: 60.0000 mL | Freq: Once | CUTANEOUS | Status: DC

## 2019-10-04 MED ORDER — MIDAZOLAM HCL 2 MG/2ML IJ SOLN
INTRAMUSCULAR | Status: AC
  Filled 2019-10-04: qty 2

## 2019-10-04 MED ORDER — METOCLOPRAMIDE HCL 5 MG PO TABS: 5.0000 mg | ORAL_TABLET | Freq: Three times a day (TID) | ORAL | Status: DC | PRN

## 2019-10-04 MED ORDER — CEFAZOLIN SODIUM-DEXTROSE 2-4 GM/100ML-% IV SOLN
2.0000 g | Freq: Four times a day (QID) | INTRAVENOUS | Status: AC
  Administered 2019-10-04 (×2): 2 g via INTRAVENOUS
  Filled 2019-10-04 (×2): qty 100

## 2019-10-04 MED ORDER — POVIDONE-IODINE 10 % EX SWAB
2.0000 "application " | Freq: Once | CUTANEOUS | Status: AC
  Administered 2019-10-04: 2 via TOPICAL

## 2019-10-04 MED ORDER — METHOCARBAMOL 500 MG PO TABS
500.0000 mg | ORAL_TABLET | Freq: Four times a day (QID) | ORAL | Status: DC | PRN
  Administered 2019-10-04 – 2019-10-05 (×2): 500 mg via ORAL
  Filled 2019-10-04 (×2): qty 1

## 2019-10-04 MED ORDER — ONDANSETRON HCL 4 MG PO TABS: 4.0000 mg | ORAL_TABLET | Freq: Four times a day (QID) | ORAL | Status: DC | PRN

## 2019-10-04 MED ORDER — ONDANSETRON HCL 4 MG/2ML IJ SOLN
INTRAMUSCULAR | Status: DC | PRN
  Administered 2019-10-04: 4 mg via INTRAVENOUS

## 2019-10-04 MED ORDER — DEXAMETHASONE SODIUM PHOSPHATE 10 MG/ML IJ SOLN
INTRAMUSCULAR | Status: AC
  Filled 2019-10-04: qty 2

## 2019-10-04 MED ORDER — FLEET ENEMA 7-19 GM/118ML RE ENEM: 1.0000 | ENEMA | Freq: Once | RECTAL | Status: DC | PRN

## 2019-10-04 MED ORDER — PROPOFOL 10 MG/ML IV BOLUS
INTRAVENOUS | Status: DC | PRN
  Administered 2019-10-04: 10 mg via INTRAVENOUS
  Administered 2019-10-04 (×2): 20 mg via INTRAVENOUS

## 2019-10-04 MED ORDER — BISACODYL 10 MG RE SUPP: 10.0000 mg | Freq: Every day | RECTAL | Status: DC | PRN

## 2019-10-04 MED ORDER — METHOCARBAMOL 500 MG IVPB - SIMPLE MED
500.0000 mg | Freq: Four times a day (QID) | INTRAVENOUS | Status: DC | PRN
  Filled 2019-10-04: qty 50

## 2019-10-04 MED ORDER — TRIAMTERENE-HCTZ 37.5-25 MG PO TABS
1.0000 | ORAL_TABLET | Freq: Every day | ORAL | Status: DC
  Filled 2019-10-04: qty 1

## 2019-10-04 MED ORDER — MENTHOL 3 MG MT LOZG: 1.0000 | LOZENGE | OROMUCOSAL | Status: DC | PRN

## 2019-10-04 MED ORDER — DOCUSATE SODIUM 100 MG PO CAPS
100.0000 mg | ORAL_CAPSULE | Freq: Two times a day (BID) | ORAL | Status: DC
  Administered 2019-10-04 – 2019-10-05 (×2): 100 mg via ORAL
  Filled 2019-10-04 (×2): qty 1

## 2019-10-04 MED ORDER — ONDANSETRON HCL 4 MG/2ML IJ SOLN
4.0000 mg | Freq: Four times a day (QID) | INTRAMUSCULAR | Status: DC | PRN
  Administered 2019-10-04: 4 mg via INTRAVENOUS
  Filled 2019-10-04: qty 2

## 2019-10-04 MED ORDER — ASPIRIN EC 325 MG PO TBEC
325.0000 mg | DELAYED_RELEASE_TABLET | Freq: Two times a day (BID) | ORAL | Status: DC
  Administered 2019-10-05: 325 mg via ORAL
  Filled 2019-10-04: qty 1

## 2019-10-04 MED ORDER — BUPIVACAINE IN DEXTROSE 0.75-8.25 % IT SOLN
INTRATHECAL | Status: DC | PRN
  Administered 2019-10-04: 1.8 mL via INTRATHECAL

## 2019-10-04 MED ORDER — DEXAMETHASONE SODIUM PHOSPHATE 10 MG/ML IJ SOLN
8.0000 mg | Freq: Once | INTRAMUSCULAR | Status: AC
  Administered 2019-10-04: 8 mg via INTRAVENOUS

## 2019-10-04 MED ORDER — BUPIVACAINE-EPINEPHRINE (PF) 0.25% -1:200000 IJ SOLN
INTRAMUSCULAR | Status: DC | PRN
  Administered 2019-10-04: 30 mL

## 2019-10-04 MED ORDER — DIPHENHYDRAMINE HCL 12.5 MG/5ML PO ELIX
12.5000 mg | ORAL_SOLUTION | ORAL | Status: DC | PRN
  Administered 2019-10-04: 25 mg via ORAL
  Filled 2019-10-04: qty 10

## 2019-10-04 MED ORDER — ONDANSETRON HCL 4 MG/2ML IJ SOLN
INTRAMUSCULAR | Status: AC
  Filled 2019-10-04: qty 4

## 2019-10-04 MED ORDER — WATER FOR IRRIGATION, STERILE IR SOLN
Status: DC | PRN
  Administered 2019-10-04: 2000 mL

## 2019-10-04 MED ORDER — DARIFENACIN HYDROBROMIDE ER 7.5 MG PO TB24
7.5000 mg | ORAL_TABLET | Freq: Every day | ORAL | Status: DC
  Administered 2019-10-05: 7.5 mg via ORAL
  Filled 2019-10-04: qty 1

## 2019-10-04 MED ORDER — ACETAMINOPHEN 10 MG/ML IV SOLN
1000.0000 mg | Freq: Four times a day (QID) | INTRAVENOUS | Status: DC
  Administered 2019-10-04: 1000 mg via INTRAVENOUS
  Filled 2019-10-04: qty 100

## 2019-10-04 MED ORDER — PHENYLEPHRINE HCL-NACL 10-0.9 MG/250ML-% IV SOLN
INTRAVENOUS | Status: AC
  Filled 2019-10-04: qty 500

## 2019-10-04 MED ORDER — TRANEXAMIC ACID-NACL 1000-0.7 MG/100ML-% IV SOLN
1000.0000 mg | INTRAVENOUS | Status: AC
  Administered 2019-10-04: 1000 mg via INTRAVENOUS
  Filled 2019-10-04: qty 100

## 2019-10-04 MED ORDER — MIDAZOLAM HCL 5 MG/5ML IJ SOLN
INTRAMUSCULAR | Status: DC | PRN
  Administered 2019-10-04: 2 mg via INTRAVENOUS

## 2019-10-04 MED ORDER — POLYETHYLENE GLYCOL 3350 17 G PO PACK: 17.0000 g | PACK | Freq: Every day | ORAL | Status: DC | PRN

## 2019-10-04 MED ORDER — LOTEPREDNOL ETABONATE 0.5 % OP SUSP
1.0000 [drp] | Freq: Every day | OPHTHALMIC | Status: DC
  Administered 2019-10-05: 1 [drp] via OPHTHALMIC
  Filled 2019-10-04: qty 5

## 2019-10-04 MED ORDER — SODIUM CHLORIDE 0.9 % IV SOLN
INTRAVENOUS | Status: DC
  Administered 2019-10-04: 14:00:00 via INTRAVENOUS

## 2019-10-04 MED ORDER — PHENYLEPHRINE 40 MCG/ML (10ML) SYRINGE FOR IV PUSH (FOR BLOOD PRESSURE SUPPORT)
PREFILLED_SYRINGE | INTRAVENOUS | Status: DC | PRN
  Administered 2019-10-04 (×3): 80 ug via INTRAVENOUS

## 2019-10-04 SURGICAL SUPPLY — 48 items
BAG DECANTER FOR FLEXI CONT (MISCELLANEOUS) IMPLANT
BAG SPEC THK2 15X12 ZIP CLS (MISCELLANEOUS)
BAG ZIPLOCK 12X15 (MISCELLANEOUS) IMPLANT
BLADE SAG 18X100X1.27 (BLADE) ×2 IMPLANT
CLSR STERI-STRIP ANTIMIC 1/2X4 (GAUZE/BANDAGES/DRESSINGS) ×2 IMPLANT
COVER PERINEAL POST (MISCELLANEOUS) ×2 IMPLANT
COVER SURGICAL LIGHT HANDLE (MISCELLANEOUS) ×2 IMPLANT
COVER WAND RF STERILE (DRAPES) IMPLANT
CUP ACET PINNACLE SECTR 50MM (Hips) IMPLANT
DECANTER SPIKE VIAL GLASS SM (MISCELLANEOUS) ×2 IMPLANT
DRAPE STERI IOBAN 125X83 (DRAPES) ×2 IMPLANT
DRAPE U-SHAPE 47X51 STRL (DRAPES) ×4 IMPLANT
DRSG ADAPTIC 3X8 NADH LF (GAUZE/BANDAGES/DRESSINGS) ×2 IMPLANT
DRSG MEPILEX BORDER 4X4 (GAUZE/BANDAGES/DRESSINGS) ×2 IMPLANT
DRSG MEPILEX BORDER 4X8 (GAUZE/BANDAGES/DRESSINGS) ×2 IMPLANT
DURAPREP 26ML APPLICATOR (WOUND CARE) ×2 IMPLANT
ELECT REM PT RETURN 15FT ADLT (MISCELLANEOUS) ×2 IMPLANT
EVACUATOR 1/8 PVC DRAIN (DRAIN) ×2 IMPLANT
GLOVE BIO SURGEON STRL SZ 6 (GLOVE) IMPLANT
GLOVE BIO SURGEON STRL SZ7 (GLOVE) IMPLANT
GLOVE BIO SURGEON STRL SZ8 (GLOVE) ×2 IMPLANT
GLOVE BIOGEL PI IND STRL 6.5 (GLOVE) IMPLANT
GLOVE BIOGEL PI IND STRL 7.0 (GLOVE) IMPLANT
GLOVE BIOGEL PI IND STRL 8 (GLOVE) ×1 IMPLANT
GLOVE BIOGEL PI INDICATOR 6.5 (GLOVE)
GLOVE BIOGEL PI INDICATOR 7.0 (GLOVE)
GLOVE BIOGEL PI INDICATOR 8 (GLOVE) ×1
GOWN STRL REUS W/TWL LRG LVL3 (GOWN DISPOSABLE) ×2 IMPLANT
GOWN STRL REUS W/TWL XL LVL3 (GOWN DISPOSABLE) IMPLANT
HEAD FEMORAL 32 CERAMIC (Hips) ×1 IMPLANT
HOLDER FOLEY CATH W/STRAP (MISCELLANEOUS) ×2 IMPLANT
KIT TURNOVER KIT A (KITS) IMPLANT
LINER MARATHON 32 50 (Hips) ×1 IMPLANT
MANIFOLD NEPTUNE II (INSTRUMENTS) ×2 IMPLANT
PACK ANTERIOR HIP CUSTOM (KITS) ×2 IMPLANT
PINNACLE SECTOR CUP 50MM (Hips) ×2 IMPLANT
SLEEVE SUCTION CATH 165 (SLEEVE) ×1 IMPLANT
STEM FEM SZ3 STD ACTIS (Stem) ×1 IMPLANT
STRIP CLOSURE SKIN 1/2X4 (GAUZE/BANDAGES/DRESSINGS) ×2 IMPLANT
SUT ETHIBOND NAB CT1 #1 30IN (SUTURE) ×2 IMPLANT
SUT MNCRL AB 4-0 PS2 18 (SUTURE) ×2 IMPLANT
SUT STRATAFIX 0 PDS 27 VIOLET (SUTURE) ×2
SUT VIC AB 2-0 CT1 27 (SUTURE) ×4
SUT VIC AB 2-0 CT1 TAPERPNT 27 (SUTURE) ×2 IMPLANT
SUTURE STRATFX 0 PDS 27 VIOLET (SUTURE) ×1 IMPLANT
SYR 50ML LL SCALE MARK (SYRINGE) IMPLANT
TRAY FOLEY MTR SLVR 14FR STAT (SET/KITS/TRAYS/PACK) ×1 IMPLANT
YANKAUER SUCT BULB TIP 10FT TU (MISCELLANEOUS) ×2 IMPLANT

## 2019-10-04 NOTE — Anesthesia Postprocedure Evaluation (Signed)
Anesthesia Post Note  Patient: Deborah Hudson  Procedure(s) Performed: TOTAL HIP ARTHROPLASTY ANTERIOR APPROACH (Left Hip)     Patient location during evaluation: PACU Anesthesia Type: Spinal Level of consciousness: oriented and awake and alert Pain management: pain level controlled Vital Signs Assessment: post-procedure vital signs reviewed and stable Respiratory status: spontaneous breathing, respiratory function stable and patient connected to nasal cannula oxygen Cardiovascular status: blood pressure returned to baseline and stable Postop Assessment: no headache, no backache and no apparent nausea or vomiting Anesthetic complications: no    Last Vitals:  Vitals:   10/04/19 1300 10/04/19 1318  BP: 111/79 114/67  Pulse: 76 74  Resp: 15 17  Temp:  36.4 C  SpO2: 100% 96%    Last Pain:  Vitals:   10/04/19 1340  TempSrc:   PainSc: 4                  Kelvyn Schunk L Tevyn Codd

## 2019-10-04 NOTE — Care Plan (Signed)
Ortho Bundle Case Management Note  Patient Details  Name: Deborah Hudson MRN: [id number] Date of Birth: Dec 15, 1945  L THA on 10-04-19 DCP:  Home with spouse.  2 story home with 4 ste. DME:  No needs.  Has a RW and 3-in-1. PT:  HEP                   DME Arranged:  N/A DME Agency:  NA  HH Arranged:  NA HH Agency:  NA  Additional Comments: Please contact me with any questions of if this plan should need to change.  Marianne Sofia, RN,CCM [facility]  [phone] 10/04/2019, 9:27 AM

## 2019-10-04 NOTE — Transfer of Care (Signed)
Immediate Anesthesia Transfer of Care Note  Patient: Deborah Hudson  Procedure(s) Performed: TOTAL HIP ARTHROPLASTY ANTERIOR APPROACH (Left Hip)  Patient Location: PACU  Anesthesia Type:Spinal  Level of Consciousness: awake, alert  and oriented  Airway & Oxygen Therapy: Patient Spontanous Breathing and Patient connected to face mask oxygen  Post-op Assessment: Report given to RN and Post -op Vital signs reviewed and stable  Post vital signs: Reviewed and stable  Last Vitals:  Vitals Value Taken Time  BP 100/62 10/04/19 1142  Temp    Pulse 74 10/04/19 1143  Resp 15 10/04/19 1143  SpO2 100 % 10/04/19 1143  Vitals shown include unvalidated device data.  Last Pain:  Vitals:   10/04/19 0831  TempSrc:   PainSc: 0-No pain      Patients Stated Pain Goal: 3 ([id number])  Complications: No apparent anesthesia complications

## 2019-10-04 NOTE — Op Note (Signed)
OPERATIVE REPORT- TOTAL HIP ARTHROPLASTY   PREOPERATIVE DIAGNOSIS: Osteoarthritis of the Left hip.   POSTOPERATIVE DIAGNOSIS: Osteoarthritis of the Left  hip.   PROCEDURE: Left total hip arthroplasty, anterior approach.   SURGEON: Gaynelle Arabian, MD   ASSISTANT: Molli Barrows, PA-C  ANESTHESIA:  Spinal  ESTIMATED BLOOD LOSS:-200 mL    DRAINS: Hemovac x1.   COMPLICATIONS: None   CONDITION: PACU - hemodynamically stable.   BRIEF CLINICAL NOTE: Deborah Hudson is a 73 y.o. female who has advanced end-  stage arthritis of their Left  hip with progressively worsening pain and  dysfunction.The patient has failed nonoperative management and presents for  total hip arthroplasty.   PROCEDURE IN DETAIL: After successful administration of spinal  anesthetic, the traction boots for the [facility] bed were placed on both  feet and the patient was placed onto the [facility] bed, boots placed into the leg  holders. The Left hip was then isolated from the perineum with plastic  drapes and prepped and draped in the usual sterile fashion. ASIS and  greater trochanter were marked and a oblique incision was made, starting  at about 1 cm lateral and 2 cm distal to the ASIS and coursing towards  the anterior cortex of the femur. The skin was cut with a 10 blade  through subcutaneous tissue to the level of the fascia overlying the  tensor fascia lata muscle. The fascia was then incised in line with the  incision at the junction of the anterior third and posterior 2/3rd. The  muscle was teased off the fascia and then the interval between the TFL  and the rectus was developed. The Hohmann retractor was then placed at  the top of the femoral neck over the capsule. The vessels overlying the  capsule were cauterized and the fat on top of the capsule was removed.  A Hohmann retractor was then placed anterior underneath the rectus  femoris to give exposure to the entire anterior capsule. A T-shaped   capsulotomy was performed. The edges were tagged and the femoral head  was identified.       Osteophytes are removed off the superior acetabulum.  The femoral neck was then cut in situ with an oscillating saw. Traction  was then applied to the left lower extremity utilizing the [facility]  traction. The femoral head was then removed. Retractors were placed  around the acetabulum and then circumferential removal of the labrum was  performed. Osteophytes were also removed. Reaming starts at 47 mm to  medialize and  Increased in 2 mm increments to 49 mm. We reamed in  approximately 40 degrees of abduction, 20 degrees anteversion. A 50 mm  pinnacle acetabular shell was then impacted in anatomic position under  fluoroscopic guidance with excellent purchase. We did not need to place  any additional dome screws. A 32 mm neutral + 4 marathon liner was then  placed into the acetabular shell.       The femoral lift was then placed along the lateral aspect of the femur  just distal to the vastus ridge. The leg was  externally rotated and capsule  was stripped off the inferior aspect of the femoral neck down to the  level of the lesser trochanter, this was done with electrocautery. The femur was lifted after this was performed. The  leg was then placed in an extended and adducted position essentially delivering the femur. We also removed the capsule superiorly and the piriformis from the piriformis  fossa to gain excellent exposure of the  proximal femur. Rongeur was used to remove some cancellous bone to get  into the lateral portion of the proximal femur for placement of the  initial starter reamer. The starter broaches was placed  the starter broach  and was shown to go down the center of the canal. Broaching  with the Actis system was then performed starting at size 0  coursing  Up to size 3. A size 3 had excellent torsional and rotational  and axial stability. The trial standard offset neck was then  placed  with a 32 + 1 trial head. The hip was then reduced. We confirmed that  the stem was in the canal both on AP and lateral x-rays. It also has excellent sizing. The hip was reduced with outstanding stability through full extension and full external rotation.. AP pelvis was taken and the leg lengths were measured and found to be equal. Hip was then dislocated again and the femoral head and neck removed. The  femoral broach was removed. Size 3 Actis stem with a standard offset  neck was then impacted into the femur following native anteversion. Has  excellent purchase in the canal. Excellent torsional and rotational and  axial stability. It is confirmed to be in the canal on AP and lateral  fluoroscopic views. The 32 + 1 ceramic head was placed and the hip  reduced with outstanding stability. Again AP pelvis was taken and it  confirmed that the leg lengths were equal. The wound was then copiously  irrigated with saline solution and the capsule reattached and repaired  with Ethibond suture. 30 ml of .25% Bupivicaine was  injected into the capsule and into the edge of the tensor fascia lata as well as subcutaneous tissue. The fascia overlying the tensor fascia lata was then closed with a running #1 V-Loc. Subcu was closed with interrupted 2-0 Vicryl and subcuticular running 4-0 Monocryl. Incision was cleaned  and dried. Steri-Strips and a bulky sterile dressing applied. Hemovac  drain was hooked to suction and then the patient was awakened and transported to  recovery in stable condition.        Please note that a surgical assistant was a medical necessity for this procedure to perform it in a safe and expeditious manner. Assistant was necessary to provide appropriate retraction of vital neurovascular structures and to prevent femoral fracture and allow for anatomic placement of the prosthesis.  Gaynelle Arabian, M.D.

## 2019-10-04 NOTE — Discharge Instructions (Signed)
°Dr. Frank Aluisio °Total Joint Specialist °[facility] °[address] °[place], [place] [address] °[phone] ° °ANTERIOR APPROACH TOTAL HIP REPLACEMENT POSTOPERATIVE DIRECTIONS ° ° °Hip Rehabilitation, Guidelines Following Surgery  °The results of a hip operation are greatly improved after range of motion and muscle strengthening exercises. Follow all safety measures which are given to protect your hip. If any of these exercises cause increased pain or swelling in your joint, decrease the amount until you are comfortable again. Then slowly increase the exercises. Call your caregiver if you have problems or questions.  ° °HOME CARE INSTRUCTIONS  °• Remove items at home which could result in a fall. This includes throw rugs or furniture in walking pathways.  °· ICE to the affected hip every three hours for 30 minutes at a time and then as needed for pain and swelling.  Continue to use ice on the hip for pain and swelling from surgery. You may notice swelling that will progress down to the foot and ankle.  This is normal after surgery.  Elevate the leg when you are not up walking on it.   °· Continue to use the breathing machine which will help keep your temperature down.  It is common for your temperature to cycle up and down following surgery, especially at night when you are not up moving around and exerting yourself.  The breathing machine keeps your lungs expanded and your temperature down. ° °DIET °You may resume your previous home diet once your are discharged from the hospital. ° °DRESSING / WOUND CARE / SHOWERING °You may shower 3 days after surgery, but keep the wounds dry during showering.  You may use an occlusive plastic wrap (Press'n Seal for example), NO SOAKING/SUBMERGING IN THE BATHTUB.  If the bandage gets wet, change with a clean dry gauze.  If the incision gets wet, pat the wound dry with a clean towel. °You may start showering once you are discharged home but do not submerge the  incision under water. Just pat the incision dry and apply a dry gauze dressing on daily. °Change the surgical dressing daily and reapply a dry dressing each time. ° °ACTIVITY °Walk with your walker as instructed. °Use walker as long as suggested by your caregivers. °Avoid periods of inactivity such as sitting longer than an hour when not asleep. This helps prevent blood clots.  °You may resume a sexual relationship in one month or when given the OK by your doctor.  °You may return to work once you are cleared by your doctor.  °Do not drive a car for 6 weeks or until released by you surgeon.  °Do not drive while taking narcotics. ° °WEIGHT BEARING °Weight bearing as tolerated with assist device (walker, cane, etc) as directed, use it as long as suggested by your surgeon or therapist, typically at least 4-6 weeks. ° °POSTOPERATIVE CONSTIPATION PROTOCOL °Constipation - defined medically as fewer than three stools per week and severe constipation as less than one stool per week. ° °One of the most common issues patients have following surgery is constipation.  Even if you have a regular bowel pattern at home, your normal regimen is likely to be disrupted due to multiple reasons following surgery.  Combination of anesthesia, postoperative narcotics, change in appetite and fluid intake all can affect your bowels.  In order to avoid complications following surgery, here are some recommendations in order to help you during your recovery period. ° °Colace (docusate) - Pick up an over-the-counter form   of Colace or another stool softener and take twice a day as long as you are requiring postoperative pain medications.  Take with a full glass of water daily.  If you experience loose stools or diarrhea, hold the colace until you stool forms back up.  If your symptoms do not get better within 1 week or if they get worse, check with your doctor. ° °Dulcolax (bisacodyl) - Pick up over-the-counter and take as directed by the product  packaging as needed to assist with the movement of your bowels.  Take with a full glass of water.  Use this product as needed if not relieved by Colace only.  ° °MiraLax (polyethylene glycol) - Pick up over-the-counter to have on hand.  MiraLax is a solution that will increase the amount of water in your bowels to assist with bowel movements.  Take as directed and can mix with a glass of water, juice, soda, coffee, or tea.  Take if you go more than two days without a movement. °Do not use MiraLax more than once per day. Call your doctor if you are still constipated or irregular after using this medication for 7 days in a row. ° °If you continue to have problems with postoperative constipation, please contact the office for further assistance and recommendations.  If you experience "the worst abdominal pain ever" or develop nausea or vomiting, please contact the office immediatly for further recommendations for treatment. ° °ITCHING ° If you experience itching with your medications, try taking only a single pain pill, or even half a pain pill at a time.  You can also use Benadryl over the counter for itching or also to help with sleep.  ° °TED HOSE STOCKINGS °Wear the elastic stockings on both legs for three weeks following surgery during the day but you may remove then at night for sleeping. ° °MEDICATIONS °See your medication summary on the “After Visit Summary” that the nursing staff will review with you prior to discharge.  You may have some home medications which will be placed on hold until you complete the course of blood thinner medication.  It is important for you to complete the blood thinner medication as prescribed by your surgeon.  Continue your approved medications as instructed at time of discharge. ° °PRECAUTIONS °If you experience chest pain or shortness of breath - call 911 immediately for transfer to the hospital emergency department.  °If you develop a fever greater that 101 F, purulent drainage  from wound, increased redness or drainage from wound, foul odor from the wound/dressing, or calf pain - CONTACT YOUR SURGEON.   °                                                °FOLLOW-UP APPOINTMENTS °Make sure you keep all of your appointments after your operation with your surgeon and caregivers. You should call the office at the above phone number and make an appointment for approximately two weeks after the date of your surgery or on the date instructed by your surgeon outlined in the "After Visit Summary". ° °RANGE OF MOTION AND STRENGTHENING EXERCISES  °These exercises are designed to help you keep full movement of your hip joint. Follow your caregiver's or physical therapist's instructions. Perform all exercises about fifteen times, three times per day or as directed. Exercise both hips, even if you have   had only one joint replacement. These exercises can be done on a training (exercise) mat, on the floor, on a table or on a bed. Use whatever works the best and is most comfortable for you. Use music or television while you are exercising so that the exercises are a pleasant break in your day. This will make your life better with the exercises acting as a break in routine you can look forward to.  °• Lying on your back, slowly slide your foot toward your buttocks, raising your knee up off the floor. Then slowly slide your foot back down until your leg is straight again.  °• Lying on your back spread your legs as far apart as you can without causing discomfort.  °• Lying on your side, raise your upper leg and foot straight up from the floor as far as is comfortable. Slowly lower the leg and repeat.  °• Lying on your back, tighten up the muscle in the front of your thigh (quadriceps muscles). You can do this by keeping your leg straight and trying to raise your heel off the floor. This helps strengthen the largest muscle supporting your knee.  °• Lying on your back, tighten up the muscles of your buttocks both  with the legs straight and with the knee bent at a comfortable angle while keeping your heel on the floor.  ° °IF YOU ARE TRANSFERRED TO A SKILLED REHAB FACILITY °If the patient is transferred to a skilled rehab facility following release from the hospital, a list of the current medications will be sent to the facility for the patient to continue.  When discharged from the skilled rehab facility, please have the facility set up the patient's Home Health Physical Therapy prior to being released. Also, the skilled facility will be responsible for providing the patient with their medications at time of release from the facility to include their pain medication, the muscle relaxants, and their blood thinner medication. If the patient is still at the rehab facility at time of the two week follow up appointment, the skilled rehab facility will also need to assist the patient in arranging follow up appointment in our office and any transportation needs. ° °MAKE SURE YOU:  °• Understand these instructions.  °• Get help right away if you are not doing well or get worse.  ° ° °Pick up stool softner and laxative for home use following surgery while on pain medications. °Do not submerge incision under water. °Please use good hand washing techniques while changing dressing each day. °May shower starting three days after surgery. °Please use a clean towel to pat the incision dry following showers. °Continue to use ice for pain and swelling after surgery. °Do not use any lotions or creams on the incision until instructed by your surgeon. ° °

## 2019-10-04 NOTE — Anesthesia Procedure Notes (Addendum)
Spinal  Patient location during procedure: OR Start time: 10/04/2019 9:56 AM End time: 10/04/2019 10:06 AM Staffing Anesthesiologist: Freddrick March, MD Performed: anesthesiologist  Preanesthetic Checklist Completed: patient identified, surgical consent, pre-op evaluation, timeout performed, IV checked, risks and benefits discussed and monitors and equipment checked Spinal Block Patient position: sitting Prep: site prepped and draped and DuraPrep Patient monitoring: cardiac monitor, continuous pulse ox and blood pressure Approach: midline Location: L2-3 Injection technique: single-shot Needle Needle type: Pencan  Needle gauge: 24 G Needle length: 9 cm Assessment Sensory level: T6 Additional Notes Functioning IV was confirmed and monitors were applied. Sterile prep and drape, including hand hygiene and sterile gloves were used. The patient was positioned and the spine was prepped. The skin was anesthetized with lidocaine.  Free flow of clear CSF was obtained prior to injecting local anesthetic into the CSF.  The spinal needle aspirated freely following injection.  The needle was carefully withdrawn.  The patient tolerated the procedure well.

## 2019-10-04 NOTE — Anesthesia Preprocedure Evaluation (Addendum)
Anesthesia Evaluation  Patient identified by MRN, date of birth, ID band Patient awake    Reviewed: Allergy & Precautions, NPO status , Patient's Chart, lab work & pertinent test results  Airway Mallampati: II  TM Distance: >3 FB Neck ROM: Full    Dental no notable dental hx. (+) Teeth Intact, Dental Advisory Given   Pulmonary neg pulmonary ROS,    Pulmonary exam normal breath sounds clear to auscultation       Cardiovascular hypertension, + Peripheral Vascular Disease  Normal cardiovascular exam Rhythm:Regular Rate:Normal  TTE 2019 - Left ventricle: The cavity size was normal. Systolic function was normal. The estimated ejection fraction was in the range of 55% to 60%. Wall motion was normal; there were no regional wall motion abnormalities. - Inferior vena cava: The vessel was normal in size. The   respirophasic diameter changes were in the normal range (>= 50%), consistent with normal central venous pressure. - No significant valvular abnormalities   Neuro/Psych  Headaches, negative psych ROS   GI/Hepatic Neg liver ROS, GERD  Medicated and Controlled,  Endo/Other  negative endocrine ROS  Renal/GU negative Renal ROS  negative genitourinary   Musculoskeletal  (+) Arthritis ,   Abdominal   Peds  Hematology negative hematology ROS (+)   Anesthesia Other Findings   Reproductive/Obstetrics                            Anesthesia Physical Anesthesia Plan  ASA: II  Anesthesia Plan: Spinal   Post-op Pain Management:    Induction:   PONV Risk Score and Plan: Treatment may vary due to age or medical condition  Airway Management Planned: Natural Airway  Additional Equipment:   Intra-op Plan:   Post-operative Plan:   Informed Consent: I have reviewed the patients History and Physical, chart, labs and discussed the procedure including the risks, benefits and alternatives for the proposed  anesthesia with the patient or authorized representative who has indicated his/her understanding and acceptance.     Dental advisory given  Plan Discussed with: CRNA  Anesthesia Plan Comments:         Anesthesia Quick Evaluation

## 2019-10-04 NOTE — Evaluation (Signed)
Physical Therapy Evaluation Patient Details Name: Deborah Hudson MRN: [id number] DOB: 07-15-1946 Today's Date: 10/04/2019   History of Present Illness  Patient is a 73 y.o. female s/p Lt THA anterior approach on 10/04/19 with PMH significant for HTN, HLD, OA.  Clinical Impression  Deborah Hudson is a 73 y.o. female POD 0 s/p Lt THA anterior approach. Patient reports independence with mobility at baseline. Patient is now limited by functional impairments (see PT problem list below) and requires min assist for transfers and gait with RW. Patient was able to ambulate ~75 feet with RW and cues/assist to maintain safe proximity to walker during gait/turns. Patient instructed in exercise to facilitate circulation. Patient will benefit from continued skilled PT interventions to address impairments and progress towards PLOF. Acute PT will follow to progress mobility and stair training in preparation for safe discharge home.     Follow Up Recommendations Follow surgeon's recommendation for DC plan and follow-up therapies    Equipment Recommendations  None recommended by PT    Recommendations for Other Services       Precautions / Restrictions Precautions Precautions: Fall Restrictions Weight Bearing Restrictions: No LLE Weight Bearing: Weight bearing as tolerated      Mobility  Bed Mobility Overal bed mobility: Needs Assistance Bed Mobility: Supine to Sit     Supine to sit: HOB elevated;Min assist     General bed mobility comments: cues required for use of bed rails and assistance required for Lt LE mobility  Transfers Overall transfer level: Needs assistance Equipment used: Rolling walker (2 wheeled) Transfers: Sit to/from Stand Sit to Stand: Min assist         General transfer comment: cues for safe hand placement and technique with RW at start, assist required to complete power up and rise to stand  Ambulation/Gait Ambulation/Gait assistance: Min assist Gait Distance  (Feet): 75 Feet Assistive device: Rolling walker (2 wheeled) Gait Pattern/deviations: Step-through pattern;Decreased stride length;Decreased step length - right Gait velocity: decreased   General Gait Details: cues for safe hand placement and step sequencing at start of gait, pt maintained throughout, she did required cues and assist wtih RW for safe step pattern during turn, no overt LOB noted throughout  Stairs            Wheelchair Mobility    Modified Rankin (Stroke Patients Only)       Balance Overall balance assessment: Needs assistance Sitting-balance support: No upper extremity supported;Feet supported Sitting balance-Leahy Scale: Good     Standing balance support: During functional activity;Bilateral upper extremity supported Standing balance-Leahy Scale: Fair           Pertinent Vitals/Pain Pain Assessment: 0-10 Pain Score: 3  Pain Location: Lt hip Pain Descriptors / Indicators: Aching;Sore Pain Intervention(s): Monitored during session;Limited activity within patient's tolerance;Repositioned;Ice applied    Home Living Family/patient expects to be discharged to:: Private residence Living Arrangements: Spouse/significant other Available Help at Discharge: Family;Available 24 hours/day Type of Home: House Home Access: Stairs to enter Entrance Stairs-Rails: Can reach both;Right;Left Entrance Stairs-Number of Steps: 4 Home Layout: Able to live on main level with bedroom/bathroom;Two level;Laundry or work area in basement;Full bath on main level Home Equipment: Walker - 2 wheels;Cane - single point;Crutches;Walker - 4 wheels;Shower seat - built in;Bedside commode;Hand held shower head      Prior Function Level of Independence: Independent         Comments: patient reports up until yesterday she was walking 2 miles 6-7/week for exercise  Hand Dominance   Dominant Hand: Right    Extremity/Trunk Assessment   Upper Extremity Assessment Upper  Extremity Assessment: Overall WFL for tasks assessed    Lower Extremity Assessment Lower Extremity Assessment: Overall WFL for tasks assessed;LLE deficits/detail LLE Deficits / Details: pt wit 4/5 or greater for quad MMT LLE Sensation: WNL LLE Coordination: WNL    Cervical / Trunk Assessment Cervical / Trunk Assessment: Normal  Communication   Communication: No difficulties  Cognition Arousal/Alertness: Awake/alert Behavior During Therapy: WFL for tasks assessed/performed Overall Cognitive Status: Within Functional Limits for tasks assessed         General Comments      Exercises Total Joint Exercises Ankle Circles/Pumps: AROM;Seated;15 reps;Both   Assessment/Plan    PT Assessment Patient needs continued PT services  PT Problem List Decreased strength;Decreased balance;Decreased mobility;Decreased range of motion;Decreased activity tolerance;Decreased knowledge of use of DME       PT Treatment Interventions Stair training;Gait training;DME instruction;Functional mobility training;Balance training;Patient/family education;Modalities;Therapeutic activities;Therapeutic exercise    PT Goals (Current goals can be found in the Care Plan section)  Acute Rehab PT Goals Patient Stated Goal: to get back home and get back to walking 2 miles a day PT Goal Formulation: With patient Time For Goal Achievement: 10/11/19 Potential to Achieve Goals: Good    Frequency 7X/week    AM-PAC PT "6 Clicks" Mobility  Outcome Measure Help needed turning from your back to your side while in a flat bed without using bedrails?: A Little Help needed moving from lying on your back to sitting on the side of a flat bed without using bedrails?: A Little Help needed moving to and from a bed to a chair (including a wheelchair)?: A Little Help needed standing up from a chair using your arms (e.g., wheelchair or bedside chair)?: A Little Help needed to walk in hospital room?: A Little Help needed  climbing 3-5 steps with a railing? : A Little 6 Click Score: 18    End of Session Equipment Utilized During Treatment: Gait belt Activity Tolerance: Patient tolerated treatment well Patient left: in chair;with call bell/phone within reach;with family/visitor present;with chair alarm set Nurse Communication: Mobility status(pt nausous) PT Visit Diagnosis: Muscle weakness (generalized) (M62.81);Difficulty in walking, not elsewhere classified (R26.2)    Time: [id number] PT Time Calculation (min) (ACUTE ONLY): 23 min   Charges:   PT Evaluation $PT Eval Low Complexity: 1 Low PT Treatments $Gait Training: 8-22 mins       Kipp Brood, PT, DPT Physical Therapist with [facility]  10/04/2019 5:22 PM

## 2019-10-04 NOTE — Interval H&P Note (Signed)
History and Physical Interval Note:  10/04/2019 8:28 AM  Zenon Mayo  has presented today for surgery, with the diagnosis of left hip osteoarthritis.  The various methods of treatment have been discussed with the patient and family. After consideration of risks, benefits and other options for treatment, the patient has consented to  Procedure(s): TOTAL HIP ARTHROPLASTY ANTERIOR APPROACH (Left) as a surgical intervention.  The patient's history has been reviewed, patient examined, no change in status, stable for surgery.  I have reviewed the patient's chart and labs.  Questions were answered to the patient's satisfaction.     Pilar Plate Simpson Paulos

## 2019-10-05 ENCOUNTER — Encounter (HOSPITAL_COMMUNITY): Payer: Self-pay | Admitting: Orthopedic Surgery

## 2019-10-05 DIAGNOSIS — M25752 Osteophyte, left hip: Secondary | ICD-10-CM | POA: Diagnosis not present

## 2019-10-05 DIAGNOSIS — I739 Peripheral vascular disease, unspecified: Secondary | ICD-10-CM | POA: Diagnosis not present

## 2019-10-05 DIAGNOSIS — M1612 Unilateral primary osteoarthritis, left hip: Secondary | ICD-10-CM | POA: Diagnosis not present

## 2019-10-05 DIAGNOSIS — Z79899 Other long term (current) drug therapy: Secondary | ICD-10-CM | POA: Diagnosis not present

## 2019-10-05 DIAGNOSIS — G43909 Migraine, unspecified, not intractable, without status migrainosus: Secondary | ICD-10-CM | POA: Diagnosis not present

## 2019-10-05 DIAGNOSIS — I10 Essential (primary) hypertension: Secondary | ICD-10-CM | POA: Diagnosis not present

## 2019-10-05 LAB — BASIC METABOLIC PANEL
Anion gap: 10 (ref 5–15)
BUN: 10 mg/dL (ref 8–23)
CO2: 26 mmol/L (ref 22–32)
Calcium: 8.9 mg/dL (ref 8.9–10.3)
Chloride: 100 mmol/L (ref 98–111)
Creatinine, Ser: 0.81 mg/dL (ref 0.44–1.00)
GFR calc Af Amer: >60 mL/min (ref >60)
GFR calc non Af Amer: >60 mL/min (ref >60)
Glucose, Bld: 121 mg/dL — ABNORMAL HIGH (ref 70–99)
Potassium: 3.8 mmol/L (ref 3.5–5.1)
Sodium: 136 mmol/L (ref 135–145)

## 2019-10-05 LAB — CBC
HCT: 38.9 % (ref 36.0–46.0)
Hemoglobin: 13.3 g/dL (ref 12.0–15.0)
MCH: 31.5 pg (ref 26.0–34.0)
MCHC: 34.2 g/dL (ref 30.0–36.0)
MCV: 92.2 fL (ref 80.0–100.0)
Platelets: 267 10*3/uL (ref 150–400)
RBC: 4.22 MIL/uL (ref 3.87–5.11)
RDW: 12.4 % (ref 11.5–15.5)
WBC: 17.2 10*3/uL — ABNORMAL HIGH (ref 4.0–10.5)
nRBC: 0 % (ref 0.0–0.2)

## 2019-10-05 MED ORDER — ASPIRIN 325 MG PO TBEC: 325.0000 mg | DELAYED_RELEASE_TABLET | Freq: Two times a day (BID) | ORAL | 0 refills | Status: DC

## 2019-10-05 MED ORDER — HYDROCODONE-ACETAMINOPHEN 5-325 MG PO TABS: 1.0000 | ORAL_TABLET | Freq: Four times a day (QID) | ORAL | 0 refills | Status: DC | PRN

## 2019-10-05 MED ORDER — TRAMADOL HCL 50 MG PO TABS: 50.0000 mg | ORAL_TABLET | Freq: Four times a day (QID) | ORAL | 0 refills | Status: DC | PRN

## 2019-10-05 MED ORDER — METHOCARBAMOL 500 MG PO TABS: 500.0000 mg | ORAL_TABLET | Freq: Four times a day (QID) | ORAL | 0 refills | Status: DC | PRN

## 2019-10-05 NOTE — Care Management CC44 (Signed)
Condition Code 44 Documentation Completed  Patient Details  Name: Deborah Hudson MRN: [id number] Date of Birth: 1945/12/12   Condition Code 44 given:  Yes Patient signature on Condition Code 44 notice:  Yes Documentation of 2 MD's agreement:  Yes Code 44 added to claim:  Yes    Leeroy Cha, RN 10/05/2019, 10:33 AM

## 2019-10-05 NOTE — Progress Notes (Signed)
Physical Therapy Treatment Patient Details Name: Deborah Hudson MRN: [id number] DOB: 1946-06-08 Today's Date: 10/05/2019    History of Present Illness Patient is a 72 y.o. female s/p Lt THA anterior approach on 10/04/19 with PMH significant for HTN, HLD, OA.    PT Comments    Pt progressing well with mobility and eager for dc home.  Pt's spouse present to review stairs, car transfers and home therex program - written instruction provided and reviewed.   Follow Up Recommendations  Follow surgeon's recommendation for DC plan and follow-up therapies     Equipment Recommendations  None recommended by PT    Recommendations for Other Services       Precautions / Restrictions Precautions Precautions: Fall Restrictions Weight Bearing Restrictions: No LLE Weight Bearing: Weight bearing as tolerated    Mobility  Bed Mobility Overal bed mobility: Needs Assistance Bed Mobility: Supine to Sit     Supine to sit: Min guard     General bed mobility comments: cues for sequence and use of R LE to self assist; pt self assisting L LE with belt  Transfers Overall transfer level: Needs assistance Equipment used: Rolling walker (2 wheeled) Transfers: Sit to/from Stand Sit to Stand: Min guard;Supervision         General transfer comment: cues for safe hand placement and technique with RW at start, assist required to complete power up and rise to stand  Ambulation/Gait Ambulation/Gait assistance: Min guard;Supervision Gait Distance (Feet): 140 Feet(and 15' into bathroom) Assistive device: Rolling walker (2 wheeled) Gait Pattern/deviations: Step-through pattern;Decreased stride length;Decreased step length - right;Shuffle;Trunk flexed Gait velocity: decreased   General Gait Details: cues for posture, position from RW and initial sequence   Stairs Stairs: Yes Stairs assistance: Min guard Stair Management: Two rails;Step to pattern;Forwards Number of Stairs: 3 General stair  comments: cues for sequence   Wheelchair Mobility    Modified Rankin (Stroke Patients Only)       Balance Overall balance assessment: Needs assistance Sitting-balance support: No upper extremity supported;Feet supported Sitting balance-Leahy Scale: Good     Standing balance support: During functional activity;Bilateral upper extremity supported Standing balance-Leahy Scale: Fair                              Cognition Arousal/Alertness: Awake/alert Behavior During Therapy: WFL for tasks assessed/performed Overall Cognitive Status: Within Functional Limits for tasks assessed                                        Exercises Total Joint Exercises Ankle Circles/Pumps: AROM;15 reps;Both;Supine Quad Sets: AROM;Both;10 reps;Supine Heel Slides: AAROM;Left;20 reps;Supine Hip ABduction/ADduction: AAROM;Left;15 reps;Supine Long Arc Quad: AAROM;AROM;Left;10 reps;Seated    General Comments        Pertinent Vitals/Pain Pain Assessment: 0-10 Pain Score: 4  Pain Location: Lt hip Pain Descriptors / Indicators: Aching;Sore Pain Intervention(s): Limited activity within patient's tolerance;Monitored during session;Premedicated before session;Ice applied    Home Living                      Prior Function            PT Goals (current goals can now be found in the care plan section) Acute Rehab PT Goals Patient Stated Goal: to get back home and get back to walking 2 miles a day PT Goal Formulation: With patient  Time For Goal Achievement: 10/11/19 Potential to Achieve Goals: Good Progress towards PT goals: Progressing toward goals    Frequency    7X/week      PT Plan Current plan remains appropriate    Co-evaluation              AM-PAC PT "6 Clicks" Mobility   Outcome Measure  Help needed turning from your back to your side while in a flat bed without using bedrails?: A Little Help needed moving from lying on your back to  sitting on the side of a flat bed without using bedrails?: A Little Help needed moving to and from a bed to a chair (including a wheelchair)?: A Little Help needed standing up from a chair using your arms (e.g., wheelchair or bedside chair)?: A Little Help needed to walk in hospital room?: A Little Help needed climbing 3-5 steps with a railing? : A Little 6 Click Score: 18    End of Session Equipment Utilized During Treatment: Gait belt Activity Tolerance: Patient tolerated treatment well Patient left: in chair;with call bell/phone within reach;with chair alarm set Nurse Communication: Mobility status PT Visit Diagnosis: Muscle weakness (generalized) (M62.81);Difficulty in walking, not elsewhere classified (R26.2)     Time: [id number] PT Time Calculation (min) (ACUTE ONLY): 41 min  Charges:  $Gait Training: 8-22 mins $Therapeutic Exercise: 8-22 mins $Therapeutic Activity: 8-22 mins                     Debe Coder PT Acute Rehabilitation Services Pager [phone] Office [phone]    Jakolby Sedivy 10/05/2019, 2:11 PM

## 2019-10-05 NOTE — Progress Notes (Signed)
Physical Therapy Treatment Patient Details Name: Deborah Hudson MRN: [id number] DOB: [birth year] Today's Date: 10/05/2019    History of Present Illness Patient is a 73 y.o. female s/p Lt THA anterior approach on 10/04/19 with PMH significant for HTN, HLD, OA.    PT Comments    Pt motivated and progressing well with mobility.   Follow Up Recommendations  Follow surgeon's recommendation for DC plan and follow-up therapies     Equipment Recommendations  None recommended by PT    Recommendations for Other Services       Precautions / Restrictions Precautions Precautions: Fall Restrictions Weight Bearing Restrictions: No LLE Weight Bearing: Weight bearing as tolerated    Mobility  Bed Mobility Overal bed mobility: Needs Assistance Bed Mobility: Supine to Sit     Supine to sit: Min assist     General bed mobility comments: cues for sequence and use of R LE to self assist  Transfers Overall transfer level: Needs assistance Equipment used: Rolling walker (2 wheeled) Transfers: Sit to/from Stand Sit to Stand: Min assist;Min guard         General transfer comment: cues for safe hand placement and technique with RW at start, assist required to complete power up and rise to stand  Ambulation/Gait Ambulation/Gait assistance: Min assist;Min guard Gait Distance (Feet): 30 Feet(15' twice to/from bathroom) Assistive device: Rolling walker (2 wheeled) Gait Pattern/deviations: Step-through pattern;Decreased stride length;Decreased step length - right;Shuffle;Trunk flexed Gait velocity: decreased   General Gait Details: cues for posture, position from RW and initial sequence   Stairs             Wheelchair Mobility    Modified Rankin (Stroke Patients Only)       Balance Overall balance assessment: Needs assistance Sitting-balance support: No upper extremity supported;Feet supported Sitting balance-Leahy Scale: Good     Standing balance support: During  functional activity;Bilateral upper extremity supported Standing balance-Leahy Scale: Fair                              Cognition Arousal/Alertness: Awake/alert Behavior During Therapy: WFL for tasks assessed/performed Overall Cognitive Status: Within Functional Limits for tasks assessed                                        Exercises Total Joint Exercises Ankle Circles/Pumps: AROM;15 reps;Both;Supine Quad Sets: AROM;Both;10 reps;Supine Heel Slides: AAROM;Left;20 reps;Supine Hip ABduction/ADduction: AAROM;Left;15 reps;Supine Long Arc Quad: AAROM;AROM;Left;10 reps;Seated    General Comments        Pertinent Vitals/Pain Pain Assessment: 0-10 Pain Score: 4  Pain Location: Lt hip Pain Descriptors / Indicators: Aching;Sore Pain Intervention(s): Limited activity within patient's tolerance;Monitored during session;Premedicated before session;Ice applied    Home Living                      Prior Function            PT Goals (current goals can now be found in the care plan section) Acute Rehab PT Goals Patient Stated Goal: to get back home and get back to walking 2 miles a day PT Goal Formulation: With patient Time For Goal Achievement: 10/11/19 Potential to Achieve Goals: Good Progress towards PT goals: Progressing toward goals    Frequency    7X/week      PT Plan Current plan remains appropriate  Co-evaluation              AM-PAC PT "6 Clicks" Mobility   Outcome Measure  Help needed turning from your back to your side while in a flat bed without using bedrails?: A Little Help needed moving from lying on your back to sitting on the side of a flat bed without using bedrails?: A Little Help needed moving to and from a bed to a chair (including a wheelchair)?: A Little Help needed standing up from a chair using your arms (e.g., wheelchair or bedside chair)?: A Little Help needed to walk in hospital room?: A Little Help  needed climbing 3-5 steps with a railing? : A Little 6 Click Score: 18    End of Session Equipment Utilized During Treatment: Gait belt Activity Tolerance: Patient tolerated treatment well Patient left: in chair;with call bell/phone within reach;with chair alarm set Nurse Communication: Mobility status PT Visit Diagnosis: Muscle weakness (generalized) (M62.81);Difficulty in walking, not elsewhere classified (R26.2)     Time: 0821-0900 PT Time Calculation (min) (ACUTE ONLY): 39 min  Charges:  $Gait Training: 8-22 mins $Therapeutic Exercise: 8-22 mins $Therapeutic Activity: 8-22 mins                     Debe Coder PT Acute Rehabilitation Services Pager [phone] Office [phone]    Joshu Furukawa 10/05/2019, 2:03 PM

## 2019-10-05 NOTE — Care Management Obs Status (Signed)
[facility] NOTIFICATION   Patient Details  Name: Deborah Hudson MRN: [id number] Date of Birth: 29-Jan-1946   Medicare Observation Status Notification Given:  Yes    Leeroy Cha, RN 10/05/2019, 10:32 AM

## 2019-10-05 NOTE — Progress Notes (Signed)
   Subjective: 1 Day Post-Op Procedure(s) (LRB): TOTAL HIP ARTHROPLASTY ANTERIOR APPROACH (Left) Patient reports pain as mild.   Patient seen in rounds with Dr. Wynelle Link. Patient is well, and has had no acute complaints or problems. No SOB or chest pain. Voiding well. Positive flatus.  Walked quite a bit with therapy yesterday.  Plan is to go Home after hospital stay.  Objective: Vital signs in last 24 hours: Temp:  [97.4 F (36.3 C)-98.9 F (37.2 C)] 98.1 F (36.7 C) (11/05 0127) Pulse Rate:  [65-86] 78 (11/05 0127) Resp:  [11-19] 18 (11/05 0127) BP: (93-146)/(62-100) 126/73 (11/05 0127) SpO2:  [96 %-100 %] 99 % (11/05 0127) Weight:  [88.4 kg] 88.4 kg (11/04 1914)  Intake/Output from previous day:  Intake/Output Summary (Last 24 hours) at 10/05/2019 0730 Last data filed at 10/05/2019 0600 Gross per 24 hour  Intake 4098.21 ml  Output 3540 ml  Net 558.21 ml     Labs: Recent Labs    10/05/19 0311  HGB 13.3   Recent Labs    10/05/19 0311  WBC 17.2*  RBC 4.22  HCT 38.9  PLT 267   Recent Labs    10/05/19 0311  NA 136  K 3.8  CL 100  CO2 26  BUN 10  CREATININE 0.81  GLUCOSE 121*  CALCIUM 8.9   EXAM General - Patient is Alert and Oriented Extremity - Neurologically intact Intact pulses distally Dorsiflexion/Plantar flexion intact No cellulitis present Compartment soft Dressing - dressing C/D/I Motor Function - intact, moving foot and toes well on exam.  Hemovac pulled without difficulty.  Past Medical History:  Diagnosis Date  . Arthritis    hips  . Back pain   . Bladder infection    frequent /with Macrodantin usage  PRN  . Hyperlipidemia    borderline  . Hypertension   . Hypoglycemia    from DECREASED PROTEIN INTAKE  . Migraine headache    migraines  . Peripheral vascular disease ([place])    varicose veins/ with injections    Assessment/Plan: 1 Day Post-Op Procedure(s) (LRB): TOTAL HIP ARTHROPLASTY ANTERIOR APPROACH (Left) Active Problems:    OA (osteoarthritis) of hip  Estimated body mass index is 28.78 kg/m as calculated from the following:   Height as of this encounter: 5\' 9"  (1.753 m).   Weight as of this encounter: 88.4 kg. Advance diet Up with therapy D/C IV fluids when tolerating POs well  DVT Prophylaxis - Aspirin Weight Bearing As Tolerated  Hemovac Pulled  Continue working with therapy today. DC home today if meeting goals. Follow up in 2 weeks. Discharge instructions given.   Ardeen Jourdain, PA-C Orthopaedic Surgery 10/05/2019, 7:30 AM

## 2019-10-06 ENCOUNTER — Encounter: Payer: Self-pay | Admitting: Internal Medicine

## 2019-10-06 DIAGNOSIS — H9193 Unspecified hearing loss, bilateral: Secondary | ICD-10-CM

## 2019-10-06 NOTE — Discharge Summary (Signed)
Physician Discharge Summary   Patient ID: ARIANYS ARJONA MRN: [id number] DOB/AGE: 1946-10-29 73 y.o.  Admit date: 10/04/2019 Discharge date: 10/05/2019  Primary Diagnosis: Primary osteoarthritis left hip   Admission Diagnoses:  Past Medical History:  Diagnosis Date  . Arthritis    hips  . Back pain   . Bladder infection    frequent /with Macrodantin usage  PRN  . Hyperlipidemia    borderline  . Hypertension   . Hypoglycemia    from DECREASED PROTEIN INTAKE  . Migraine headache    migraines  . Peripheral vascular disease ([place])    varicose veins/ with injections   Discharge Diagnoses:   Active Problems:   OA (osteoarthritis) of hip  Estimated body mass index is 28.78 kg/m as calculated from the following:   Height as of this encounter: 5\' 9"  (1.753 m).   Weight as of this encounter: 88.4 kg.  Procedure(s) (LRB): TOTAL HIP ARTHROPLASTY ANTERIOR APPROACH (Left)   Consults: None  HPI: Zenon Mayo, 73 y.o. female, has a history of pain and functional disability in the left hip(s) due to arthritis and patient has failed non-surgical conservative treatments for greater than 12 weeks to include NSAID's and/or analgesics, corticosteriod injections, flexibility and strengthening excercises and activity modification.  Onset of symptoms was gradual starting 3 years ago with gradually worsening course since that time.The patient noted no past surgery on the left hip(s).  Patient currently rates pain in the left hip at 4 out of 10 with activity. Patient has night pain, worsening of pain with activity and weight bearing, pain that interfers with activities of daily living and pain with passive range of motion. Patient has evidence of periarticular osteophytes and joint space narrowing by imaging studies. This condition presents safety issues increasing the risk of falls. There is no current active infection.  Laboratory Data: Admission on 10/04/2019, Discharged on 10/05/2019   Component Date Value Ref Range Status  . WBC 10/05/2019 17.2* 4.0 - 10.5 K/uL Final  . RBC 10/05/2019 4.22  3.87 - 5.11 MIL/uL Final  . Hemoglobin 10/05/2019 13.3  12.0 - 15.0 g/dL Final  . HCT 10/05/2019 38.9  36.0 - 46.0 % Final  . MCV 10/05/2019 92.2  80.0 - 100.0 fL Final  . MCH 10/05/2019 31.5  26.0 - 34.0 pg Final  . MCHC 10/05/2019 34.2  30.0 - 36.0 g/dL Final  . RDW 10/05/2019 12.4  11.5 - 15.5 % Final  . Platelets 10/05/2019 267  150 - 400 K/uL Final  . nRBC 10/05/2019 0.0  0.0 - 0.2 % Final   Performed at [facility], [place] [address] [place], [place] [address]  . Sodium 10/05/2019 136  135 - 145 mmol/L Final  . Potassium 10/05/2019 3.8  3.5 - 5.1 mmol/L Final  . Chloride 10/05/2019 100  98 - 111 mmol/L Final  . CO2 10/05/2019 26  22 - 32 mmol/L Final  . Glucose, Bld 10/05/2019 121* 70 - 99 mg/dL Final  . BUN 10/05/2019 10  8 - 23 mg/dL Final  . Creatinine, Ser 10/05/2019 0.81  0.44 - 1.00 mg/dL Final  . Calcium 10/05/2019 8.9  8.9 - 10.3 mg/dL Final  . GFR calc non Af Amer 10/05/2019 >60  >60 mL/min Final  . GFR calc Af Amer 10/05/2019 >60  >60 mL/min Final  . Anion gap 10/05/2019 10  5 - 15 Final   Performed at [facility], [place] [address] [place], [place] [address]  Hospital Outpatient Visit  on 09/30/2019  Component Date Value Ref Range Status  . SARS-CoV-2, NAA 09/30/2019 NOT DETECTED  NOT DETECTED Final   Comment: (NOTE) This nucleic acid amplification test was developed and its performance characteristics determined by [employer]. Nucleic acid amplification tests include PCR and TMA. This test has not been FDA cleared or approved. This test has been authorized by FDA under an Emergency Use Authorization (EUA). This test is only authorized for the duration of time the declaration that circumstances exist justifying the authorization of the emergency use of in vitro diagnostic tests for detection of SARS-CoV-2 virus  and/or diagnosis of COVID-19 infection under section 564(b)(1) of the Act, 21 U.S.C. [id number]) (1), unless the authorization is terminated or revoked sooner. When diagnostic testing is negative, the possibility of a false negative result should be considered in the context of a patient's recent exposures and the presence of clinical signs and symptoms consistent with COVID-19. An individual without symptoms of COVID- 19 and who is not shedding SARS-CoV-2 vi                          rus would expect to have a negative (not detected) result in this assay. Performed At: [facility] [place], [place] [id number] Rush Farmer MD [id number]   . Coronavirus Source 09/30/2019 NASOPHARYNGEAL   Final   Performed at [facility], [place] [address], [place], [place] [address]  Hospital Outpatient Visit on 09/28/2019  Component Date Value Ref Range Status  . aPTT 09/28/2019 29  24 - 36 seconds Final   Performed at [facility], [place] [address], [place], [place] [address]  . WBC 09/28/2019 7.9  4.0 - 10.5 K/uL Final  . RBC 09/28/2019 4.67  3.87 - 5.11 MIL/uL Final  . Hemoglobin 09/28/2019 14.5  12.0 - 15.0 g/dL Final  . HCT 09/28/2019 43.5  36.0 - 46.0 % Final  . MCV 09/28/2019 93.1  80.0 - 100.0 fL Final  . MCH 09/28/2019 31.0  26.0 - 34.0 pg Final  . MCHC 09/28/2019 33.3  30.0 - 36.0 g/dL Final  . RDW 09/28/2019 12.3  11.5 - 15.5 % Final  . Platelets 09/28/2019 295  150 - 400 K/uL Final  . nRBC 09/28/2019 0.0  0.0 - 0.2 % Final   Performed at [facility], [place] [address], [place], [place] [address]  . Sodium 09/28/2019 133* 135 - 145 mmol/L Final  . Potassium 09/28/2019 4.2  3.5 - 5.1 mmol/L Final  . Chloride 09/28/2019 99  98 - 111 mmol/L Final  . CO2 09/28/2019 26  22 - 32 mmol/L Final  . Glucose, Bld 09/28/2019 104* 70 - 99 mg/dL Final  . BUN 09/28/2019 13  8 - 23 mg/dL Final  . Creatinine, Ser 09/28/2019 0.83  0.44 -  1.00 mg/dL Final  . Calcium 09/28/2019 9.2  8.9 - 10.3 mg/dL Final  . Total Protein 09/28/2019 6.9  6.5 - 8.1 g/dL Final  . Albumin 09/28/2019 4.0  3.5 - 5.0 g/dL Final  . AST 09/28/2019 17  15 - 41 U/L Final  . ALT 09/28/2019 16  0 - 44 U/L Final  . Alkaline Phosphatase 09/28/2019 53  38 - 126 U/L Final  . Total Bilirubin 09/28/2019 1.0  0.3 - 1.2 mg/dL Final  . GFR calc non Af Amer 09/28/2019 >60  >60 mL/min Final  . GFR calc Af Amer 09/28/2019 >60  >60 mL/min Final  .  Anion gap 09/28/2019 8  5 - 15 Final   Performed at [facility], [place] [address] [place], [place] [address]  . Prothrombin Time 09/28/2019 12.9  11.4 - 15.2 seconds Final  . INR 09/28/2019 1.0  0.8 - 1.2 Final   Comment: (NOTE) INR goal varies based on device and disease states. Performed at [facility], [place] [address], [place], [place] [address]   . ABO/RH(D) 09/28/2019 B POS   Final  . Antibody Screen 09/28/2019 NEG   Final  . Sample Expiration 09/28/2019 10/07/2019,2359   Final  . Extend sample reason 09/28/2019    Final                   Value:NO TRANSFUSIONS OR PREGNANCY IN THE PAST 3 MONTHS Performed at [facility], [place] [address], [place], [place] [address]   . MRSA, PCR 09/28/2019 NEGATIVE  NEGATIVE Final  . Staphylococcus aureus 09/28/2019 NEGATIVE  NEGATIVE Final   Comment: (NOTE) The Xpert SA Assay (FDA approved for NASAL specimens in patients 7 years of age and older), is one component of a comprehensive surveillance program. It is not intended to diagnose infection nor to guide or monitor treatment. Performed at [facility], [place] [address] [place], [place] [address]      X-Rays:Dg Pelvis Portable  Result Date: 10/04/2019 CLINICAL DATA:  Status post left total hip arthroplasty. Osteoarthritis of the left hip. EXAM: PORTABLE PELVIS 1-2 VIEWS COMPARISON:  Pelvis radiograph dated 02/17/2012 FINDINGS: The new left total  hip prosthesis appears in excellent position. Soft tissue drain in place. No fractures. Right total hip prosthesis appears unchanged. IMPRESSION: Satisfactory appearance of the new left total hip prosthesis. Electronically Signed   By: Lorriane Shire M.D.   On: 10/04/2019 12:24   Dg C-arm 1-60 Min-no Report  Result Date: 10/04/2019 Fluoroscopy was utilized by the requesting physician.  No radiographic interpretation.   Dg Hip Operative Unilat W Or W/o Pelvis Left  Result Date: 10/04/2019 CLINICAL DATA:  Left hip replacement EXAM: OPERATIVE LEFT HIP (WITH PELVIS IF PERFORMED) 1 VIEWS TECHNIQUE: Fluoroscopic spot image(s) were submitted for interpretation post-operatively. COMPARISON:  None. FINDINGS: Multiple intraoperative fluoroscopic spot images are provided. Interval left total hip arthroplasty. Normal alignment. FLUOROSCOPY TIME:  6 seconds IMPRESSION: Intraoperative localization. Electronically Signed   By: Kathreen Devoid   On: 10/04/2019 11:29    EKG: Orders placed or performed in visit on 09/25/19  . EKG 12-Lead     Hospital Course: Patient was admitted to [facility] and taken to the OR and underwent the above state procedure without complications.  Patient tolerated the procedure well and was later transferred to the recovery room and then to the orthopaedic floor for postoperative care.  They were given PO and IV analgesics for pain control following their surgery.  They were given 24 hours of postoperative antibiotics of  Anti-infectives (From admission, onward)   Start     Dose/Rate Route Frequency Ordered Stop   10/04/19 1600  ceFAZolin (ANCEF) IVPB 2g/100 mL premix     2 g 200 mL/hr over 30 Minutes Intravenous Every 6 hours 10/04/19 1314 10/04/19 2140   10/04/19 0800  ceFAZolin (ANCEF) IVPB 2g/100 mL premix     2 g 200 mL/hr over 30 Minutes Intravenous On call to O.R. 10/04/19 0755 10/04/19 1004     and started on DVT prophylaxis in the form of Aspirin.   PT and OT were  ordered  for total hip protocol.  The patient was allowed to be WBAT with therapy. Discharge planning was consulted to help with postop disposition and equipment needs.  Patient had a fair night on the evening of surgery.  They started to get up OOB with therapy on day one.  Hemovac drain was pulled without difficulty.  The patient had progressed with therapy and meeting their goals.  Incision was healing well.  Patient was seen in rounds and was ready to go home.  Diet: Cardiac diet Activity:WBAT Follow-up:in 2 weeks Disposition - Home Discharged Condition: stable   Discharge Instructions    Call MD / Call 911   Complete by: As directed    If you experience chest pain or shortness of breath, CALL 911 and be transported to the hospital emergency room.  If you develope a fever above 101 F, pus (white drainage) or increased drainage or redness at the wound, or calf pain, call your surgeon's office.   Constipation Prevention   Complete by: As directed    Drink plenty of fluids.  Prune juice may be helpful.  You may use a stool softener, such as Colace (over the counter) 100 mg twice a day.  Use MiraLax (over the counter) for constipation as needed.   Diet - low sodium heart healthy   Complete by: As directed    Discharge instructions   Complete by: As directed    Dr. Gaynelle Arabian Total Joint Specialist [facility] [address] [place],  [address] [phone]  ANTERIOR APPROACH TOTAL HIP REPLACEMENT POSTOPERATIVE DIRECTIONS   Hip Rehabilitation, Guidelines Following Surgery  The results of a hip operation are greatly improved after range of motion and muscle strengthening exercises. Follow all safety measures which are given to protect your hip. If any of these exercises cause increased pain or swelling in your joint, decrease the amount until you are comfortable again. Then slowly increase the exercises. Call your caregiver if you have problems or questions.    HOME CARE INSTRUCTIONS  Remove items at home which could result in a fall. This includes throw rugs or furniture in walking pathways.  ICE to the affected hip every three hours for 30 minutes at a time and then as needed for pain and swelling.  Continue to use ice on the hip for pain and swelling from surgery. You may notice swelling that will progress down to the foot and ankle.  This is normal after surgery.  Elevate the leg when you are not up walking on it.   Continue to use the breathing machine which will help keep your temperature down.  It is common for your temperature to cycle up and down following surgery, especially at night when you are not up moving around and exerting yourself.  The breathing machine keeps your lungs expanded and your temperature down.   DIET You may resume your previous home diet once your are discharged from the hospital.  DRESSING / WOUND CARE / SHOWERING You may shower 3 days after surgery, but keep the wounds dry during showering.  You may use an occlusive plastic wrap (Press'n Seal for example), NO SOAKING/SUBMERGING IN THE BATHTUB.  If the bandage gets wet, change with a clean dry gauze.  If the incision gets wet, pat the wound dry with a clean towel. You may start showering once you are discharged home but do not submerge the incision under water. Just pat the incision dry and apply a dry gauze dressing on daily.  Change the surgical dressing daily and reapply a dry dressing each time.  ACTIVITY Walk with your walker as instructed. Use walker as long as suggested by your caregivers. Avoid periods of inactivity such as sitting longer than an hour when not asleep. This helps prevent blood clots.  You may resume a sexual relationship in one month or when given the OK by your doctor.  You may return to work once you are cleared by your doctor.  Do not drive a car for 6 weeks or until released by you surgeon.  Do not drive while taking narcotics.  WEIGHT  BEARING Weight bearing as tolerated with assist device (walker, cane, etc) as directed, use it as long as suggested by your surgeon or therapist, typically at least 4-6 weeks.  POSTOPERATIVE CONSTIPATION PROTOCOL Constipation - defined medically as fewer than three stools per week and severe constipation as less than one stool per week.  One of the most common issues patients have following surgery is constipation.  Even if you have a regular bowel pattern at home, your normal regimen is likely to be disrupted due to multiple reasons following surgery.  Combination of anesthesia, postoperative narcotics, change in appetite and fluid intake all can affect your bowels.  In order to avoid complications following surgery, here are some recommendations in order to help you during your recovery period.  Colace (docusate) - Pick up an over-the-counter form of Colace or another stool softener and take twice a day as long as you are requiring postoperative pain medications.  Take with a full glass of water daily.  If you experience loose stools or diarrhea, hold the colace until you stool forms back up.  If your symptoms do not get better within 1 week or if they get worse, check with your doctor.  Dulcolax (bisacodyl) - Pick up over-the-counter and take as directed by the product packaging as needed to assist with the movement of your bowels.  Take with a full glass of water.  Use this product as needed if not relieved by Colace only.   MiraLax (polyethylene glycol) - Pick up over-the-counter to have on hand.  MiraLax is a solution that will increase the amount of water in your bowels to assist with bowel movements.  Take as directed and can mix with a glass of water, juice, soda, coffee, or tea.  Take if you go more than two days without a movement. Do not use MiraLax more than once per day. Call your doctor if you are still constipated or irregular after using this medication for 7 days in a row.  If you  continue to have problems with postoperative constipation, please contact the office for further assistance and recommendations.  If you experience "the worst abdominal pain ever" or develop nausea or vomiting, please contact the office immediatly for further recommendations for treatment.  ITCHING  If you experience itching with your medications, try taking only a single pain pill, or even half a pain pill at a time.  You can also use Benadryl over the counter for itching or also to help with sleep.   TED HOSE STOCKINGS Wear the elastic stockings on both legs for three weeks following surgery during the day but you may remove then at night for sleeping.  MEDICATIONS See your medication summary on the "After Visit Summary" that the nursing staff will review with you prior to discharge.  You may have some home medications which will be placed on hold until you complete the  course of blood thinner medication.  It is important for you to complete the blood thinner medication as prescribed by your surgeon.  Continue your approved medications as instructed at time of discharge.  PRECAUTIONS If you experience chest pain or shortness of breath - call 911 immediately for transfer to the hospital emergency department.  If you develop a fever greater that 101 F, purulent drainage from wound, increased redness or drainage from wound, foul odor from the wound/dressing, or calf pain - CONTACT YOUR SURGEON.                                                   FOLLOW-UP APPOINTMENTS Make sure you keep all of your appointments after your operation with your surgeon and caregivers. You should call the office at the above phone number and make an appointment for approximately two weeks after the date of your surgery or on the date instructed by your surgeon outlined in the "After Visit Summary".  RANGE OF MOTION AND STRENGTHENING EXERCISES  These exercises are designed to help you keep full movement of your hip joint.  Follow your caregiver's or physical therapist's instructions. Perform all exercises about fifteen times, three times per day or as directed. Exercise both hips, even if you have had only one joint replacement. These exercises can be done on a training (exercise) mat, on the floor, on a table or on a bed. Use whatever works the best and is most comfortable for you. Use music or television while you are exercising so that the exercises are a pleasant break in your day. This will make your life better with the exercises acting as a break in routine you can look forward to.  Lying on your back, slowly slide your foot toward your buttocks, raising your knee up off the floor. Then slowly slide your foot back down until your leg is straight again.  Lying on your back spread your legs as far apart as you can without causing discomfort.  Lying on your side, raise your upper leg and foot straight up from the floor as far as is comfortable. Slowly lower the leg and repeat.  Lying on your back, tighten up the muscle in the front of your thigh (quadriceps muscles). You can do this by keeping your leg straight and trying to raise your heel off the floor. This helps strengthen the largest muscle supporting your knee.  Lying on your back, tighten up the muscles of your buttocks both with the legs straight and with the knee bent at a comfortable angle while keeping your heel on the floor.   IF YOU ARE TRANSFERRED TO A SKILLED REHAB FACILITY If the patient is transferred to a skilled rehab facility following release from the hospital, a list of the current medications will be sent to the facility for the patient to continue.  When discharged from the skilled rehab facility, please have the facility set up the patient's [place] prior to being released. Also, the skilled facility will be responsible for providing the patient with their medications at time of release from the facility to include their pain  medication, the muscle relaxants, and their blood thinner medication. If the patient is still at the rehab facility at time of the two week follow up appointment, the skilled rehab facility will also need to assist  the patient in arranging follow up appointment in our office and any transportation needs.  MAKE SURE YOU:  Understand these instructions.  Get help right away if you are not doing well or get worse.    Pick up stool softner and laxative for home use following surgery while on pain medications. Do not submerge incision under water. Please use good hand washing techniques while changing dressing each day. May shower starting three days after surgery. Please use a clean towel to pat the incision dry following showers. Continue to use ice for pain and swelling after surgery. Do not use any lotions or creams on the incision until instructed by your surgeon.   Increase activity slowly as tolerated   Complete by: As directed      Allergies as of 10/05/2019      Reactions   Sulfa Antibiotics Itching   Adhesive [tape] Rash   Rash at the site      Medication List    STOP taking these medications   acidophilus Caps capsule   Biotin 5000 5 MG Caps Generic drug: Biotin   BOSWELLIA PO   diclofenac sodium 1 % Gel Commonly known as: Voltaren   ELLURA PO   GINKGO BILOBA PO   naproxen sodium 220 MG tablet Commonly known as: ALEVE   OVER THE COUNTER MEDICATION   TURMERIC CURCUMIN PO     TAKE these medications   aspirin 325 MG EC tablet Take 1 tablet (325 mg total) by mouth 2 (two) times daily.   estradiol 0.1 MG/GM vaginal cream Commonly known as: ESTRACE VAGINAL Place 1 Applicatorful vaginally at bedtime. For two weeks then twice a week   HYDROcodone-acetaminophen 5-325 MG tablet Commonly known as: NORCO/VICODIN Take 1-2 tablets by mouth every 6 (six) hours as needed for severe pain.   loteprednol 0.5 % ophthalmic suspension Commonly known as: LOTEMAX Place 1  drop into the right eye daily.   LUBRICATING EYE DROPS OP Place 1 drop into both eyes 2 (two) times daily as needed (dry eyes).   methocarbamol 500 MG tablet Commonly known as: ROBAXIN Take 1 tablet (500 mg total) by mouth every 6 (six) hours as needed for muscle spasms.   oxybutynin 10 MG 24 hr tablet Commonly known as: DITROPAN-XL Take 10 mg by mouth at bedtime.   SUMAtriptan 100 MG tablet Commonly known as: IMITREX Take 1 tablet (100 mg total) by mouth every 2 (two) hours as needed. For migraine   traMADol 50 MG tablet Commonly known as: ULTRAM Take 1-2 tablets (50-100 mg total) by mouth every 6 (six) hours as needed for moderate pain.   triamterene-hydrochlorothiazide 37.5-25 MG tablet Commonly known as: MAXZIDE-25 Take 1 tablet by mouth daily.   trospium 20 MG tablet Commonly known as: SANCTURA Take 60 mg by mouth daily.      Follow-up Information    Gaynelle Arabian, MD. Go on 10/17/2019.   Specialty: Orthopedic Surgery Why: You are scheduled for a post-operative appointment on 10-17-19 at 1:00 pm.  Contact information: [address] [place] [address] [id number]           Signed: Ardeen Jourdain, PA-C Orthopaedic Surgery 10/06/2019, 7:28 AM

## 2019-10-20 NOTE — Progress Notes (Signed)
HPI:  FU lower ext edema.   Echocardiogram January 2019 showed normal LV function.  Lower extremity venous Dopplers June 2019 showed no DVT.  Since last seen November 2018 there is no dyspnea, chest pain, palpitations or syncope.  She has had 3 occasions when her blood pressure has spiked on 3 separate occasions.  On one occasion it was 210 over 100.  Otherwise it is reasonably well controlled.  Current Outpatient Medications  Medication Sig Dispense Refill  . aspirin EC 325 MG EC tablet Take 1 tablet (325 mg total) by mouth 2 (two) times daily. 40 tablet 0  . estradiol (ESTRACE VAGINAL) 0.1 MG/GM vaginal cream Place 1 Applicatorful vaginally at bedtime. For two weeks then twice a week 42.5 g 12  . loteprednol (LOTEMAX) 0.5 % ophthalmic suspension Place 1 drop into the right eye daily.    . SUMAtriptan (IMITREX) 100 MG tablet Take 1 tablet (100 mg total) by mouth every 2 (two) hours as needed. For migraine 10 tablet 5  . triamterene-hydrochlorothiazide (MAXZIDE-25) 37.5-25 MG tablet Take 1 tablet by mouth daily. 90 tablet 1  . trospium (SANCTURA) 20 MG tablet Take 60 mg by mouth daily.    . Carboxymethylcellul-Glycerin (LUBRICATING EYE DROPS OP) Place 1 drop into both eyes 2 (two) times daily as needed (dry eyes).     Marland Kitchen HYDROcodone-acetaminophen (NORCO/VICODIN) 5-325 MG tablet Take 1-2 tablets by mouth every 6 (six) hours as needed for severe pain. (Patient not taking: Reported on 10/31/2019) 40 tablet 0  . methocarbamol (ROBAXIN) 500 MG tablet Take 1 tablet (500 mg total) by mouth every 6 (six) hours as needed for muscle spasms. (Patient not taking: Reported on 10/31/2019) 40 tablet 0  . oxybutynin (DITROPAN-XL) 10 MG 24 hr tablet Take 10 mg by mouth at bedtime.    . traMADol (ULTRAM) 50 MG tablet Take 1-2 tablets (50-100 mg total) by mouth every 6 (six) hours as needed for moderate pain. (Patient not taking: Reported on 10/31/2019) 40 tablet 0   No current facility-administered medications  for this visit.      Past Medical History:  Diagnosis Date  . Arthritis    hips  . Back pain   . Bladder infection    frequent /with Macrodantin usage  PRN  . Hyperlipidemia    borderline  . Hypertension   . Hypoglycemia    from DECREASED PROTEIN INTAKE  . Migraine headache    migraines  . Peripheral vascular disease ([place])    varicose veins/ with injections    Past Surgical History:  Procedure Laterality Date  . ANTERIOR LAT LUMBAR FUSION N/A 04/18/2018   Procedure: Lumbar Three and Lumbar Four anterior lateral decompression with lateral plate fixation;  Surgeon: Kristeen Miss, MD;  Location: [place];  Service: Neurosurgery;  Laterality: N/A;  Lumbar Three and Lumbar Four anterior lateral decompression with lateral plate fixation  . BREAST SURGERY     bilateral breast lumpectomy  . CATARACT EXTRACTION Right 2011  . COLONOSCOPY    . CORNEAL TRANSPLANT Right 2011  . EYE SURGERY    . SHOULDER SURGERY Right 2015  . TOE SURGERY     right great toe scrapping for arthritis  . TOTAL HIP ARTHROPLASTY  02/17/2012   Procedure: TOTAL HIP ARTHROPLASTY;  Surgeon: Gearlean Alf, MD;  Location: [facility];  Service: Orthopedics;  Laterality: Right;  . TOTAL HIP ARTHROPLASTY Left 10/04/2019   Procedure: TOTAL HIP ARTHROPLASTY ANTERIOR APPROACH;  Surgeon: Gaynelle Arabian, MD;  Location:  [facility];  Service: Orthopedics;  Laterality: Left;    Social History   Socioeconomic History  . Marital status: Married    Spouse name: Not on file  . Number of children: 2  . Years of education: Not on file  . Highest education level: Not on file  Occupational History  . Occupation: retired    Comment: Chief Financial Officer  Social Needs  . Financial resource strain: Not hard at all  . Food insecurity    Worry: Never true    Inability: Never true  . Transportation needs    Medical: No    Non-medical: No  Tobacco Use  . Smoking status: Never Smoker  . Smokeless tobacco: Never Used  Substance  and Sexual Activity  . Alcohol use: Yes    Alcohol/week: 2.0 standard drinks    Types: 2 Glasses of wine per week    Comment: Rare  . Drug use: No  . Sexual activity: Yes  Lifestyle  . Physical activity    Days per week: 3 days    Minutes per session: 50 min  . Stress: Not at all  Relationships  . Social connections    Talks on phone: More than three times a week    Gets together: More than three times a week    Attends religious service: More than 4 times per year    Active member of club or organization: Yes    Attends meetings of clubs or organizations: More than 4 times per year    Relationship status: Married  . Intimate partner violence    Fear of current or ex partner: No    Emotionally abused: No    Physically abused: No    Forced sexual activity: No  Other Topics Concern  . Not on file  Social History Narrative  . Not on file    Family History  Problem Relation Age of Onset  . Cancer Mother        lung  . Alzheimer's disease Mother   . Depression Mother   . Colon polyps Mother   . Colon cancer Mother   . Stroke Father   . Cancer Maternal Grandfather        lung  . Stroke Maternal Grandmother     ROS: Some left thigh pain following recent hip replacement but no fevers or chills, productive cough, hemoptysis, dysphasia, odynophagia, melena, hematochezia, dysuria, hematuria, rash, seizure activity, orthopnea, PND, pedal edema, claudication. Remaining systems are negative.  Physical Exam: Well-developed well-nourished in no acute distress.  Skin is warm and dry.  HEENT is normal.  Neck is supple.  Chest is clear to auscultation with normal expansion.  Cardiovascular exam is regular rate and rhythm.  Abdominal exam nontender or distended. No masses palpated. Extremities show no edema. neuro grossly intact    A/P  1 lower extremity edema-patient is doing well on present medical regimen.  Continue present dose of diuretic.    2 hypertension-blood  pressure controlled today.  She has had 3 separate spikes of uncertain etiology.  I have asked her to document her blood pressure 3-4 times weekly.  She will bring her cuff with next office visit to correlate with ours.  We will adjust regimen as needed.  If she continues to have spikes may need to assess for renal artery stenosis or other secondary causes of hypertension.  3 hyperlipidemia-followed by primary care.  Kirk Ruths, MD

## 2019-10-31 ENCOUNTER — Other Ambulatory Visit: Payer: Self-pay

## 2019-10-31 ENCOUNTER — Ambulatory Visit (INDEPENDENT_AMBULATORY_CARE_PROVIDER_SITE_OTHER): Payer: Medicare Other | Admitting: Cardiology

## 2019-10-31 ENCOUNTER — Encounter: Payer: Self-pay | Admitting: Cardiology

## 2019-10-31 VITALS — BP 118/64 | HR 92 | Temp 97.0°F | Ht 69.0 in | Wt 186.6 lb

## 2019-10-31 DIAGNOSIS — E7800 Pure hypercholesterolemia, unspecified: Secondary | ICD-10-CM | POA: Diagnosis not present

## 2019-10-31 DIAGNOSIS — I10 Essential (primary) hypertension: Secondary | ICD-10-CM

## 2019-10-31 DIAGNOSIS — R609 Edema, unspecified: Secondary | ICD-10-CM

## 2019-10-31 NOTE — Patient Instructions (Signed)
Medication Instructions:  NO CHANGE *If you need a refill on your cardiac medications before your next appointment, please call your pharmacy*  Lab Work: If you have labs (blood work) drawn today and your tests are completely normal, you will receive your results only by: Marland Kitchen MyChart Message (if you have MyChart) OR . A paper copy in the mail If you have any lab test that is abnormal or we need to change your treatment, we will call you to review the results.  Follow-Up: At [facility], you and your health needs are our priority.  As part of our continuing mission to provide you with exceptional heart care, we have created designated Provider Care Teams.  These Care Teams include your primary Cardiologist (physician) and Advanced Practice Providers (APPs -  Physician Assistants and Nurse Practitioners) who all work together to provide you with the care you need, when you need it.  Your physician recommends that you schedule a follow-up appointment in: [place] physician recommends that you schedule a follow-up appointment in: [place]

## 2019-11-05 ENCOUNTER — Other Ambulatory Visit: Payer: Self-pay | Admitting: Internal Medicine

## 2019-11-07 DIAGNOSIS — Z96642 Presence of left artificial hip joint: Secondary | ICD-10-CM | POA: Diagnosis not present

## 2019-11-07 DIAGNOSIS — Z471 Aftercare following joint replacement surgery: Secondary | ICD-10-CM | POA: Diagnosis not present

## 2019-11-14 DIAGNOSIS — H40023 Open angle with borderline findings, high risk, bilateral: Secondary | ICD-10-CM | POA: Diagnosis not present

## 2019-12-12 DIAGNOSIS — H903 Sensorineural hearing loss, bilateral: Secondary | ICD-10-CM | POA: Diagnosis not present

## 2019-12-13 ENCOUNTER — Ambulatory Visit: Payer: Medicare Other | Admitting: Cardiology

## 2020-01-01 ENCOUNTER — Ambulatory Visit: Payer: Medicare Other

## 2020-01-05 DIAGNOSIS — Z1231 Encounter for screening mammogram for malignant neoplasm of breast: Secondary | ICD-10-CM | POA: Diagnosis not present

## 2020-01-17 ENCOUNTER — Ambulatory Visit: Payer: Medicare Other | Admitting: Internal Medicine

## 2020-01-20 IMAGING — CT CT L SPINE W/O CM
1 of 6 series · 7 of 14 positions shown, 9 images · non-contrast
Comparison: Radiographs dated 02/02/2018 and lumbar MRI dated
02/23/2017

CLINICAL DATA: Low back pain and bilateral leg pain.

EXAM:
CT LUMBAR SPINE WITHOUT CONTRAST
TECHNIQUE: Multidetector CT imaging of the lumbar spine was performed without
intravenous contrast administration. Multiplanar CT image
reconstructions were also generated.

[Series 3: l spine soft · axial · 0.32mm/px · z∈[-376,-170]mm · 7 of 93 slices shown, 9 images]
[im 12/93  soft-tissue]
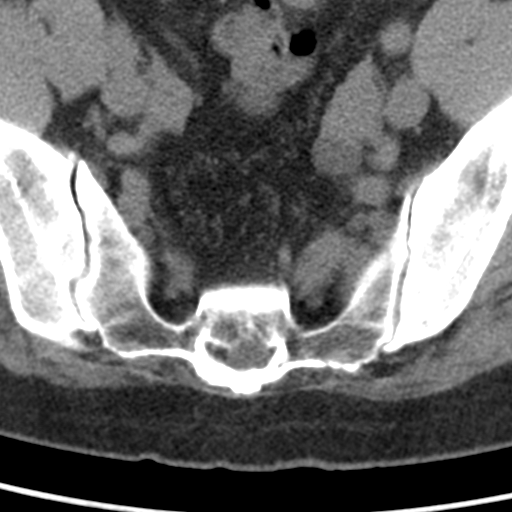
[im 12/93  bone]
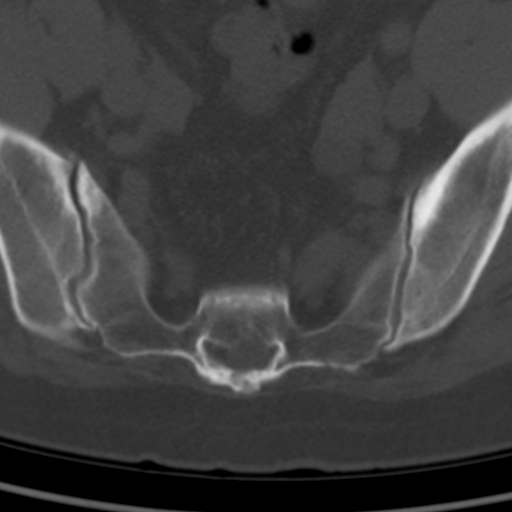
[im 24/93  bone]
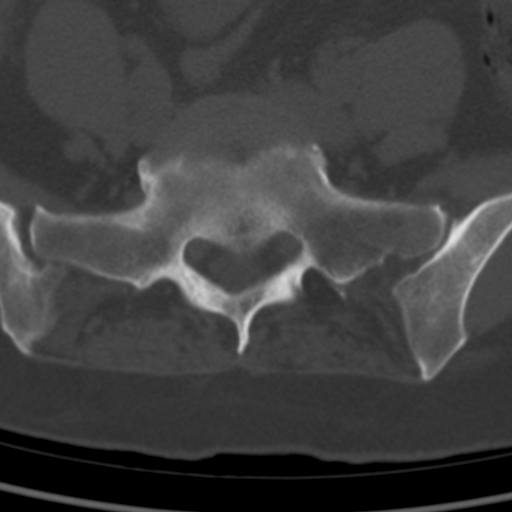
[im 35/93  bone]
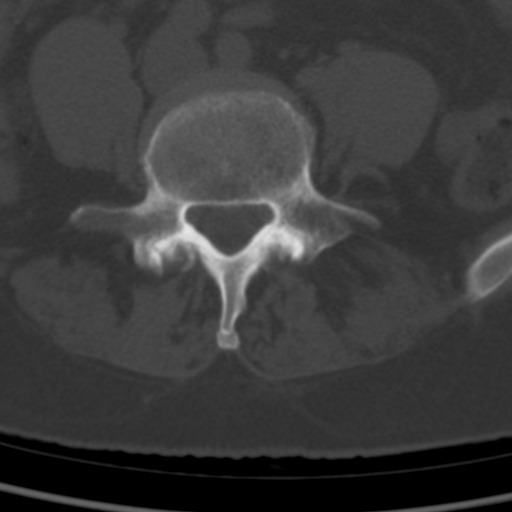
[im 47/93  bone]
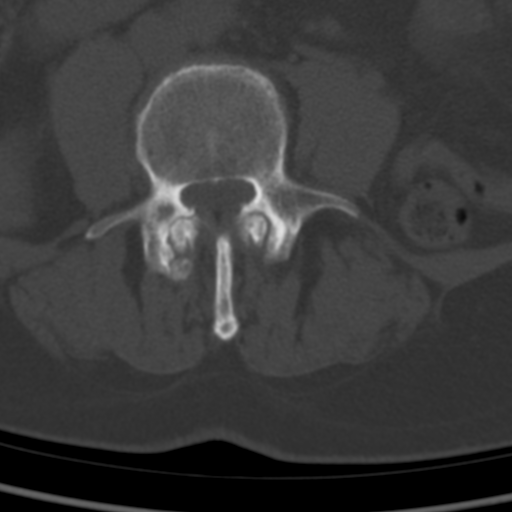
[im 58/93  soft-tissue]
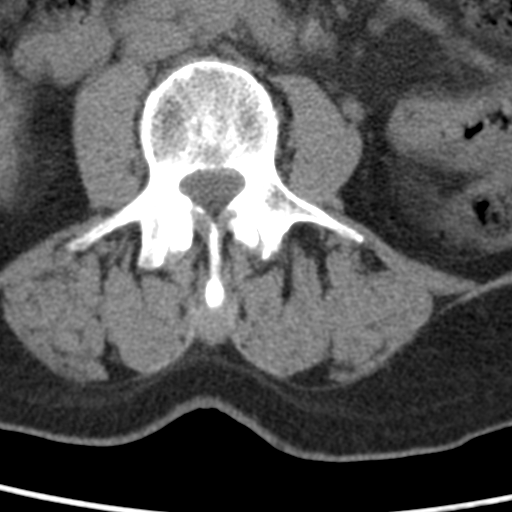
[im 58/93  bone]
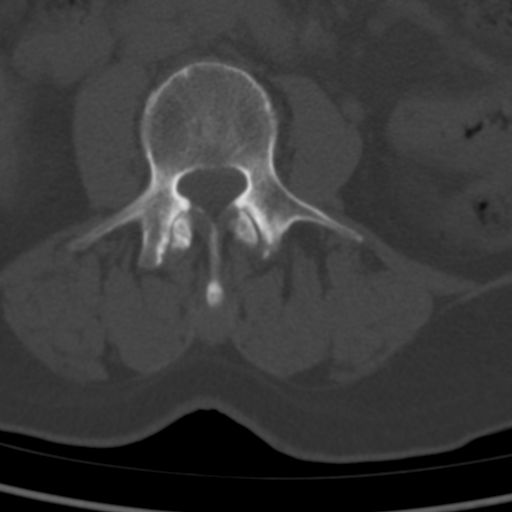
[im 70/93  bone]
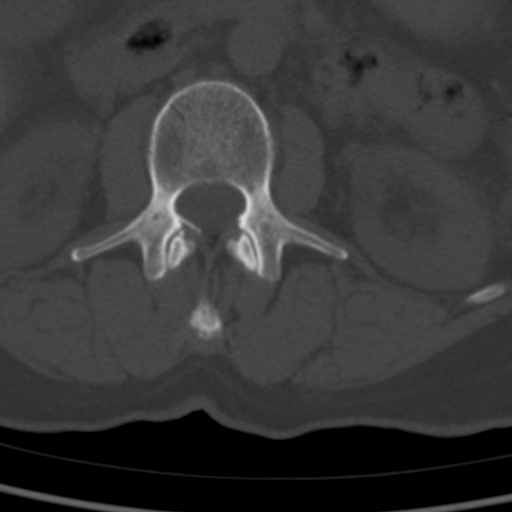
[im 81/93  bone]
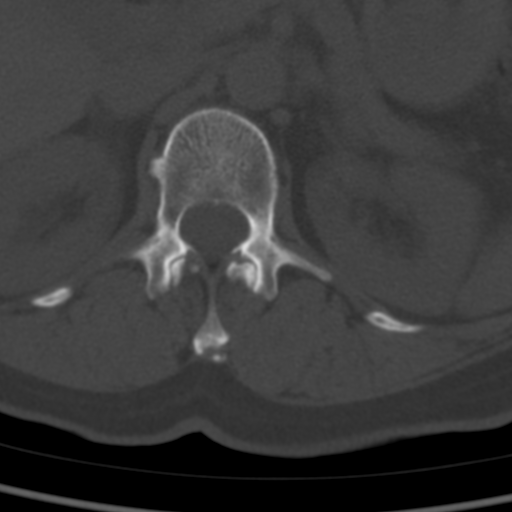

[7 of 14 positions shown; findings below may reference images not displayed]

FINDINGS: Segmentation: 5 lumbar type vertebrae.

Alignment: 3 mm spondylolisthesis at L3-4. 4 mm spondylolisthesis at
L4-5.

Vertebrae: Severe bilateral facet arthritis at L3-4 and L4-5. No
fracture or bone destruction.

Paraspinal and other soft tissues: Slight aortic atherosclerosis.

Disc levels: T12-L1 through L2-3: Normal discs. Minimal degenerative
changes of the facet joints.

L3-4: Prominent broad-based disc protrusion slightly asymmetric to
the left. Combined with hypertrophy of the facet joints and
ligamentum flavum and 3 mm spondylolisthesis, this creates severe
compression of the thecal sac.

L4-5: Severe bilateral facet arthritis with slight
spondylolisthesis. No focal neural impingement. No foraminal
stenosis. No significant hypertrophy of the ligamentum flavum.

L5-S1: Minimal central disc bulge slightly asymmetric to the right
without focal neural impingement. Moderate right and minimal left
facet arthritis.
IMPRESSION: 1. Severe compression of the thecal sac at L3-4 due to a combination
of a prominent disc protrusion asymmetric to the left, hypertrophy
of the ligamentum flavum and bilateral facet joints.
2. Severe bilateral facet arthritis at L4-5 with slight
spondylolisthesis but without spinal stenosis or focal neural
impingement.

## 2020-01-24 DIAGNOSIS — Z471 Aftercare following joint replacement surgery: Secondary | ICD-10-CM | POA: Diagnosis not present

## 2020-01-24 DIAGNOSIS — Z96641 Presence of right artificial hip joint: Secondary | ICD-10-CM | POA: Diagnosis not present

## 2020-01-29 NOTE — Progress Notes (Deleted)
HPI: FU lower ext edema. Echocardiogram January 2019 showed normal LV function. Lower extremity venous Dopplers June 2019 showed no DVT.  Since last seen   Current Outpatient Medications  Medication Sig Dispense Refill  . aspirin EC 325 MG EC tablet Take 1 tablet (325 mg total) by mouth 2 (two) times daily. 40 tablet 0  . Carboxymethylcellul-Glycerin (LUBRICATING EYE DROPS OP) Place 1 drop into both eyes 2 (two) times daily as needed (dry eyes).     Marland Kitchen estradiol (ESTRACE VAGINAL) 0.1 MG/GM vaginal cream Place 1 Applicatorful vaginally at bedtime. For two weeks then twice a week 42.5 g 12  . HYDROcodone-acetaminophen (NORCO/VICODIN) 5-325 MG tablet Take 1-2 tablets by mouth every 6 (six) hours as needed for severe pain. (Patient not taking: Reported on 10/31/2019) 40 tablet 0  . loteprednol (LOTEMAX) 0.5 % ophthalmic suspension Place 1 drop into the right eye daily.    . methocarbamol (ROBAXIN) 500 MG tablet Take 1 tablet (500 mg total) by mouth every 6 (six) hours as needed for muscle spasms. (Patient not taking: Reported on 10/31/2019) 40 tablet 0  . oxybutynin (DITROPAN-XL) 10 MG 24 hr tablet Take 10 mg by mouth at bedtime.    . SUMAtriptan (IMITREX) 100 MG tablet Take 1 tablet (100 mg total) by mouth every 2 (two) hours as needed. For migraine 10 tablet 5  . traMADol (ULTRAM) 50 MG tablet Take 1-2 tablets (50-100 mg total) by mouth every 6 (six) hours as needed for moderate pain. (Patient not taking: Reported on 10/31/2019) 40 tablet 0  . triamterene-hydrochlorothiazide (MAXZIDE-25) 37.5-25 MG tablet TAKE 1 TABLET BY MOUTH EVERY DAY 90 tablet 1  . trospium (SANCTURA) 20 MG tablet Take 60 mg by mouth daily.     No current facility-administered medications for this visit.     Past Medical History:  Diagnosis Date  . Arthritis    hips  . Back pain   . Bladder infection    frequent /with Macrodantin usage  PRN  . Hyperlipidemia    borderline  . Hypertension   . Hypoglycemia    from DECREASED PROTEIN INTAKE  . Migraine headache    migraines  . Peripheral vascular disease ([place])    varicose veins/ with injections    Past Surgical History:  Procedure Laterality Date  . ANTERIOR LAT LUMBAR FUSION N/A 04/18/2018   Procedure: Lumbar Three and Lumbar Four anterior lateral decompression with lateral plate fixation;  Surgeon: Kristeen Miss, MD;  Location: [place];  Service: Neurosurgery;  Laterality: N/A;  Lumbar Three and Lumbar Four anterior lateral decompression with lateral plate fixation  . BREAST SURGERY     bilateral breast lumpectomy  . CATARACT EXTRACTION Right 2011  . COLONOSCOPY    . CORNEAL TRANSPLANT Right 2011  . EYE SURGERY    . SHOULDER SURGERY Right 2015  . TOE SURGERY     right great toe scrapping for arthritis  . TOTAL HIP ARTHROPLASTY  02/17/2012   Procedure: TOTAL HIP ARTHROPLASTY;  Surgeon: Gearlean Alf, MD;  Location: [facility];  Service: Orthopedics;  Laterality: Right;  . TOTAL HIP ARTHROPLASTY Left 10/04/2019   Procedure: TOTAL HIP ARTHROPLASTY ANTERIOR APPROACH;  Surgeon: Gaynelle Arabian, MD;  Location: [facility];  Service: Orthopedics;  Laterality: Left;    Social History   Socioeconomic History  . Marital status: Married    Spouse name: Not on file  . Number of children: 2  . Years of education: Not on file  . Highest  education level: Not on file  Occupational History  . Occupation: retired    Comment: Chief Financial Officer  Tobacco Use  . Smoking status: Never Smoker  . Smokeless tobacco: Never Used  Substance and Sexual Activity  . Alcohol use: Yes    Alcohol/week: 2.0 standard drinks    Types: 2 Glasses of wine per week    Comment: Rare  . Drug use: No  . Sexual activity: Yes  Other Topics Concern  . Not on file  Social History Narrative  . Not on file   Social Determinants of Health   Financial Resource Strain:   . Difficulty of Paying Living Expenses: Not on file  Food Insecurity:   . Worried About Sales executive in the Last Year: Not on file  . Ran Out of Food in the Last Year: Not on file  Transportation Needs:   . Lack of Transportation (Medical): Not on file  . Lack of Transportation (Non-Medical): Not on file  Physical Activity: Unknown  . Days of Exercise per Week: Not on file  . Minutes of Exercise per Session: 50 min  Stress:   . Feeling of Stress : Not on file  Social Connections:   . Frequency of Communication with Friends and Family: Not on file  . Frequency of Social Gatherings with Friends and Family: Not on file  . Attends Religious Services: Not on file  . Active Member of Clubs or Organizations: Not on file  . Attends Archivist Meetings: Not on file  . Marital Status: Not on file  Intimate Partner Violence:   . Fear of Current or Ex-Partner: Not on file  . Emotionally Abused: Not on file  . Physically Abused: Not on file  . Sexually Abused: Not on file    Family History  Problem Relation Age of Onset  . Cancer Mother        lung  . Alzheimer's disease Mother   . Depression Mother   . Colon polyps Mother   . Colon cancer Mother   . Stroke Father   . Cancer Maternal Grandfather        lung  . Stroke Maternal Grandmother     ROS: no fevers or chills, productive cough, hemoptysis, dysphasia, odynophagia, melena, hematochezia, dysuria, hematuria, rash, seizure activity, orthopnea, PND, pedal edema, claudication. Remaining systems are negative.  Physical Exam: Well-developed well-nourished in no acute distress.  Skin is warm and dry.  HEENT is normal.  Neck is supple.  Chest is clear to auscultation with normal expansion.  Cardiovascular exam is regular rate and rhythm.  Abdominal exam nontender or distended. No masses palpated. Extremities show no edema. neuro grossly intact  ECG- personally reviewed  A/P  1 treatment edema-well-controlled with present regimen.  Will continue.  2 hypertension-  3 hyperlipidemia-managed by primary  care.  Kirk Ruths, MD

## 2020-02-06 ENCOUNTER — Ambulatory Visit: Payer: Medicare Other | Admitting: Cardiology

## 2020-02-09 DIAGNOSIS — M7061 Trochanteric bursitis, right hip: Secondary | ICD-10-CM | POA: Diagnosis not present

## 2020-02-09 DIAGNOSIS — Z96641 Presence of right artificial hip joint: Secondary | ICD-10-CM | POA: Diagnosis not present

## 2020-02-22 DIAGNOSIS — M25551 Pain in right hip: Secondary | ICD-10-CM | POA: Diagnosis not present

## 2020-03-08 DIAGNOSIS — Z96641 Presence of right artificial hip joint: Secondary | ICD-10-CM | POA: Diagnosis not present

## 2020-03-08 DIAGNOSIS — Z471 Aftercare following joint replacement surgery: Secondary | ICD-10-CM | POA: Diagnosis not present

## 2020-03-08 DIAGNOSIS — Z96642 Presence of left artificial hip joint: Secondary | ICD-10-CM | POA: Diagnosis not present

## 2020-03-08 DIAGNOSIS — Z96643 Presence of artificial hip joint, bilateral: Secondary | ICD-10-CM | POA: Diagnosis not present

## 2020-03-11 DIAGNOSIS — D3911 Neoplasm of uncertain behavior of right ovary: Secondary | ICD-10-CM | POA: Diagnosis not present

## 2020-03-11 DIAGNOSIS — D259 Leiomyoma of uterus, unspecified: Secondary | ICD-10-CM | POA: Diagnosis not present

## 2020-03-11 DIAGNOSIS — N83209 Unspecified ovarian cyst, unspecified side: Secondary | ICD-10-CM | POA: Diagnosis not present

## 2020-03-28 DIAGNOSIS — N83209 Unspecified ovarian cyst, unspecified side: Secondary | ICD-10-CM | POA: Diagnosis not present

## 2020-03-28 DIAGNOSIS — R1900 Intra-abdominal and pelvic swelling, mass and lump, unspecified site: Secondary | ICD-10-CM | POA: Diagnosis not present

## 2020-03-28 DIAGNOSIS — D259 Leiomyoma of uterus, unspecified: Secondary | ICD-10-CM | POA: Diagnosis not present

## 2020-03-28 DIAGNOSIS — N9489 Other specified conditions associated with female genital organs and menstrual cycle: Secondary | ICD-10-CM | POA: Diagnosis not present

## 2020-03-29 DIAGNOSIS — I10 Essential (primary) hypertension: Secondary | ICD-10-CM | POA: Diagnosis not present

## 2020-03-29 DIAGNOSIS — M1990 Unspecified osteoarthritis, unspecified site: Secondary | ICD-10-CM | POA: Diagnosis not present

## 2020-03-29 DIAGNOSIS — Z01812 Encounter for preprocedural laboratory examination: Secondary | ICD-10-CM | POA: Diagnosis not present

## 2020-03-29 DIAGNOSIS — N9489 Other specified conditions associated with female genital organs and menstrual cycle: Secondary | ICD-10-CM | POA: Diagnosis not present

## 2020-04-03 DIAGNOSIS — I10 Essential (primary) hypertension: Secondary | ICD-10-CM | POA: Diagnosis not present

## 2020-04-03 DIAGNOSIS — N9489 Other specified conditions associated with female genital organs and menstrual cycle: Secondary | ICD-10-CM | POA: Diagnosis not present

## 2020-04-03 DIAGNOSIS — Z01812 Encounter for preprocedural laboratory examination: Secondary | ICD-10-CM | POA: Diagnosis not present

## 2020-04-03 DIAGNOSIS — R519 Headache, unspecified: Secondary | ICD-10-CM | POA: Diagnosis not present

## 2020-04-03 DIAGNOSIS — M1990 Unspecified osteoarthritis, unspecified site: Secondary | ICD-10-CM | POA: Diagnosis not present

## 2020-04-05 DIAGNOSIS — Z79899 Other long term (current) drug therapy: Secondary | ICD-10-CM | POA: Diagnosis not present

## 2020-04-05 DIAGNOSIS — G43909 Migraine, unspecified, not intractable, without status migrainosus: Secondary | ICD-10-CM | POA: Diagnosis not present

## 2020-04-05 DIAGNOSIS — N9489 Other specified conditions associated with female genital organs and menstrual cycle: Secondary | ICD-10-CM | POA: Diagnosis not present

## 2020-04-05 DIAGNOSIS — Z96643 Presence of artificial hip joint, bilateral: Secondary | ICD-10-CM | POA: Diagnosis not present

## 2020-04-05 DIAGNOSIS — N83201 Unspecified ovarian cyst, right side: Secondary | ICD-10-CM | POA: Diagnosis not present

## 2020-04-05 DIAGNOSIS — D259 Leiomyoma of uterus, unspecified: Secondary | ICD-10-CM | POA: Diagnosis not present

## 2020-04-05 DIAGNOSIS — N841 Polyp of cervix uteri: Secondary | ICD-10-CM | POA: Diagnosis not present

## 2020-04-05 DIAGNOSIS — R1909 Other intra-abdominal and pelvic swelling, mass and lump: Secondary | ICD-10-CM | POA: Diagnosis not present

## 2020-04-05 DIAGNOSIS — D270 Benign neoplasm of right ovary: Secondary | ICD-10-CM | POA: Diagnosis not present

## 2020-04-05 DIAGNOSIS — N812 Incomplete uterovaginal prolapse: Secondary | ICD-10-CM | POA: Diagnosis not present

## 2020-04-05 DIAGNOSIS — R1903 Right lower quadrant abdominal swelling, mass and lump: Secondary | ICD-10-CM | POA: Diagnosis not present

## 2020-04-05 DIAGNOSIS — R1900 Intra-abdominal and pelvic swelling, mass and lump, unspecified site: Secondary | ICD-10-CM | POA: Diagnosis not present

## 2020-04-05 DIAGNOSIS — N814 Uterovaginal prolapse, unspecified: Secondary | ICD-10-CM | POA: Diagnosis not present

## 2020-04-05 DIAGNOSIS — N858 Other specified noninflammatory disorders of uterus: Secondary | ICD-10-CM | POA: Diagnosis not present

## 2020-04-05 DIAGNOSIS — I10 Essential (primary) hypertension: Secondary | ICD-10-CM | POA: Diagnosis not present

## 2020-04-06 DIAGNOSIS — N841 Polyp of cervix uteri: Secondary | ICD-10-CM | POA: Diagnosis not present

## 2020-04-06 DIAGNOSIS — N83201 Unspecified ovarian cyst, right side: Secondary | ICD-10-CM | POA: Diagnosis not present

## 2020-04-06 DIAGNOSIS — D259 Leiomyoma of uterus, unspecified: Secondary | ICD-10-CM | POA: Diagnosis not present

## 2020-04-06 DIAGNOSIS — G43909 Migraine, unspecified, not intractable, without status migrainosus: Secondary | ICD-10-CM | POA: Diagnosis not present

## 2020-04-06 DIAGNOSIS — N814 Uterovaginal prolapse, unspecified: Secondary | ICD-10-CM | POA: Diagnosis not present

## 2020-04-06 DIAGNOSIS — I10 Essential (primary) hypertension: Secondary | ICD-10-CM | POA: Diagnosis not present

## 2020-04-22 ENCOUNTER — Telehealth: Payer: Self-pay | Admitting: Internal Medicine

## 2020-04-22 NOTE — Progress Notes (Signed)
  Chronic Care Management   Note  04/22/2020 Name: Deborah Hudson MRN: [id number] DOB: 04-30-1946  Deborah Hudson is a 74 y.o. year old female who is a primary care patient of Burns, Claudina Lick, MD. I reached out to Zenon Mayo by phone today in response to a referral sent by [name]'s PCP, Binnie Rail, MD.   [name] was given information about Chronic Care Management services today including:  1. CCM service includes personalized support from designated clinical staff supervised by her physician, including individualized plan of care and coordination with other care providers 2. 24/7 contact phone numbers for assistance for urgent and routine care needs. 3. Service will only be billed when office clinical staff spend 20 minutes or more in a month to coordinate care. 4. Only one practitioner may furnish and bill the service in a calendar month. 5. The patient may stop CCM services at any time (effective at the end of the month) by phone call to the office staff.   Patient agreed to services and verbal consent obtained.   This note is not being shared with the patient for the following reason: To respect privacy (The patient or proxy has requested that the information not be shared).  Follow up plan:   Earney [place] Upstream Scheduler

## 2020-05-16 DIAGNOSIS — N390 Urinary tract infection, site not specified: Secondary | ICD-10-CM | POA: Diagnosis not present

## 2020-05-16 DIAGNOSIS — Z79899 Other long term (current) drug therapy: Secondary | ICD-10-CM | POA: Diagnosis not present

## 2020-05-24 ENCOUNTER — Other Ambulatory Visit: Payer: Self-pay

## 2020-05-24 MED ORDER — TRIAMTERENE-HCTZ 37.5-25 MG PO TABS: 1.0000 | ORAL_TABLET | Freq: Every day | ORAL | 1 refills | Status: AC

## 2020-06-05 ENCOUNTER — Telehealth: Payer: Self-pay

## 2020-06-05 DIAGNOSIS — N3000 Acute cystitis without hematuria: Secondary | ICD-10-CM

## 2020-06-05 NOTE — Telephone Encounter (Signed)
New message    Recent diagnosis with UTI by Dr. Luana Shu in [facility] two weeks ago - was called in antibiotic  Macrobid.    The patient is asking for a recheck to see if the UTI is still present.   Asking can an order be placed in the epic system.  The patient was offered an appointment with MD refuse at this time asking for a message to be sent around.

## 2020-06-05 NOTE — Telephone Encounter (Signed)
Urine ordered

## 2020-06-06 ENCOUNTER — Other Ambulatory Visit: Payer: Medicare Other

## 2020-06-06 DIAGNOSIS — N3000 Acute cystitis without hematuria: Secondary | ICD-10-CM

## 2020-06-07 LAB — URINALYSIS, ROUTINE W REFLEX MICROSCOPIC
Bilirubin Urine: NEGATIVE
Glucose, UA: NEGATIVE
Hgb urine dipstick: NEGATIVE
Ketones, ur: NEGATIVE
Leukocytes,Ua: NEGATIVE
Nitrite: NEGATIVE
Protein, ur: NEGATIVE
Specific Gravity, Urine: 1.006 (ref 1.001–1.03)
pH: 7 (ref 5.0–8.0)

## 2020-06-07 LAB — URINE CULTURE: Result:: NO GROWTH

## 2020-07-01 DIAGNOSIS — M19011 Primary osteoarthritis, right shoulder: Secondary | ICD-10-CM | POA: Diagnosis not present

## 2020-07-01 DIAGNOSIS — M25512 Pain in left shoulder: Secondary | ICD-10-CM | POA: Diagnosis not present

## 2020-07-02 DIAGNOSIS — G43909 Migraine, unspecified, not intractable, without status migrainosus: Secondary | ICD-10-CM | POA: Diagnosis not present

## 2020-07-02 DIAGNOSIS — K219 Gastro-esophageal reflux disease without esophagitis: Secondary | ICD-10-CM | POA: Diagnosis not present

## 2020-07-02 DIAGNOSIS — N3281 Overactive bladder: Secondary | ICD-10-CM | POA: Diagnosis not present

## 2020-07-02 DIAGNOSIS — I10 Essential (primary) hypertension: Secondary | ICD-10-CM | POA: Diagnosis not present

## 2020-07-02 DIAGNOSIS — H18519 Endothelial corneal dystrophy, unspecified eye: Secondary | ICD-10-CM | POA: Diagnosis not present

## 2020-07-02 DIAGNOSIS — E7849 Other hyperlipidemia: Secondary | ICD-10-CM | POA: Diagnosis not present

## 2020-07-02 DIAGNOSIS — G4762 Sleep related leg cramps: Secondary | ICD-10-CM | POA: Diagnosis not present

## 2020-07-02 DIAGNOSIS — M791 Myalgia, unspecified site: Secondary | ICD-10-CM | POA: Diagnosis not present

## 2020-07-02 DIAGNOSIS — R32 Unspecified urinary incontinence: Secondary | ICD-10-CM | POA: Diagnosis not present

## 2020-07-02 DIAGNOSIS — E785 Hyperlipidemia, unspecified: Secondary | ICD-10-CM | POA: Diagnosis not present

## 2020-07-04 ENCOUNTER — Telehealth: Payer: Medicare Other

## 2020-07-25 DIAGNOSIS — N941 Unspecified dyspareunia: Secondary | ICD-10-CM | POA: Diagnosis not present

## 2020-08-13 ENCOUNTER — Institutional Professional Consult (permissible substitution): Payer: Self-pay | Admitting: Neurology

## 2020-08-26 ENCOUNTER — Institutional Professional Consult (permissible substitution): Payer: Self-pay | Admitting: Neurology

## 2020-08-27 ENCOUNTER — Institutional Professional Consult (permissible substitution): Payer: Medicare Other | Admitting: Neurology

## 2020-08-27 ENCOUNTER — Telehealth: Payer: Self-pay | Admitting: Neurology

## 2020-08-27 NOTE — Telephone Encounter (Signed)
Pt was a no show to scheduled sleep consult apt

## 2020-09-25 ENCOUNTER — Institutional Professional Consult (permissible substitution): Payer: Medicare Other | Admitting: Neurology

## 2020-10-15 DIAGNOSIS — R399 Unspecified symptoms and signs involving the genitourinary system: Secondary | ICD-10-CM | POA: Diagnosis not present

## 2020-10-18 DIAGNOSIS — Z23 Encounter for immunization: Secondary | ICD-10-CM | POA: Diagnosis not present

## 2020-11-01 DIAGNOSIS — M48061 Spinal stenosis, lumbar region without neurogenic claudication: Secondary | ICD-10-CM | POA: Diagnosis not present

## 2020-11-01 DIAGNOSIS — N390 Urinary tract infection, site not specified: Secondary | ICD-10-CM | POA: Diagnosis not present

## 2020-11-01 DIAGNOSIS — Z90710 Acquired absence of both cervix and uterus: Secondary | ICD-10-CM | POA: Diagnosis not present

## 2020-11-01 DIAGNOSIS — G43009 Migraine without aura, not intractable, without status migrainosus: Secondary | ICD-10-CM | POA: Diagnosis not present

## 2020-11-01 DIAGNOSIS — N3941 Urge incontinence: Secondary | ICD-10-CM | POA: Diagnosis not present

## 2020-11-01 DIAGNOSIS — M62838 Other muscle spasm: Secondary | ICD-10-CM | POA: Diagnosis not present

## 2020-11-01 DIAGNOSIS — Z79899 Other long term (current) drug therapy: Secondary | ICD-10-CM | POA: Diagnosis not present

## 2020-11-01 DIAGNOSIS — E559 Vitamin D deficiency, unspecified: Secondary | ICD-10-CM | POA: Diagnosis not present

## 2020-12-05 DIAGNOSIS — Z1389 Encounter for screening for other disorder: Secondary | ICD-10-CM | POA: Diagnosis not present

## 2020-12-05 DIAGNOSIS — Z1231 Encounter for screening mammogram for malignant neoplasm of breast: Secondary | ICD-10-CM | POA: Diagnosis not present

## 2020-12-05 DIAGNOSIS — N3941 Urge incontinence: Secondary | ICD-10-CM | POA: Diagnosis not present

## 2020-12-05 DIAGNOSIS — M48061 Spinal stenosis, lumbar region without neurogenic claudication: Secondary | ICD-10-CM | POA: Diagnosis not present

## 2020-12-05 DIAGNOSIS — G479 Sleep disorder, unspecified: Secondary | ICD-10-CM | POA: Diagnosis not present

## 2020-12-05 DIAGNOSIS — E559 Vitamin D deficiency, unspecified: Secondary | ICD-10-CM | POA: Diagnosis not present

## 2020-12-05 DIAGNOSIS — G43009 Migraine without aura, not intractable, without status migrainosus: Secondary | ICD-10-CM | POA: Diagnosis not present

## 2020-12-05 DIAGNOSIS — Z13220 Encounter for screening for lipoid disorders: Secondary | ICD-10-CM | POA: Diagnosis not present

## 2020-12-05 DIAGNOSIS — Z0000 Encounter for general adult medical examination without abnormal findings: Secondary | ICD-10-CM | POA: Diagnosis not present

## 2020-12-05 DIAGNOSIS — Z90710 Acquired absence of both cervix and uterus: Secondary | ICD-10-CM | POA: Diagnosis not present

## 2020-12-05 DIAGNOSIS — Z1211 Encounter for screening for malignant neoplasm of colon: Secondary | ICD-10-CM | POA: Diagnosis not present

## 2020-12-05 DIAGNOSIS — M62838 Other muscle spasm: Secondary | ICD-10-CM | POA: Diagnosis not present

## 2020-12-16 DIAGNOSIS — G4761 Periodic limb movement disorder: Secondary | ICD-10-CM | POA: Diagnosis not present

## 2020-12-16 DIAGNOSIS — I10 Essential (primary) hypertension: Secondary | ICD-10-CM | POA: Diagnosis not present

## 2020-12-16 DIAGNOSIS — G4719 Other hypersomnia: Secondary | ICD-10-CM | POA: Diagnosis not present

## 2020-12-17 DIAGNOSIS — L570 Actinic keratosis: Secondary | ICD-10-CM | POA: Diagnosis not present

## 2020-12-17 DIAGNOSIS — D1801 Hemangioma of skin and subcutaneous tissue: Secondary | ICD-10-CM | POA: Diagnosis not present

## 2020-12-17 DIAGNOSIS — L812 Freckles: Secondary | ICD-10-CM | POA: Diagnosis not present

## 2020-12-17 DIAGNOSIS — D2272 Melanocytic nevi of left lower limb, including hip: Secondary | ICD-10-CM | POA: Diagnosis not present

## 2020-12-17 DIAGNOSIS — L720 Epidermal cyst: Secondary | ICD-10-CM | POA: Diagnosis not present

## 2020-12-17 DIAGNOSIS — L821 Other seborrheic keratosis: Secondary | ICD-10-CM | POA: Diagnosis not present

## 2020-12-17 DIAGNOSIS — D225 Melanocytic nevi of trunk: Secondary | ICD-10-CM | POA: Diagnosis not present

## 2020-12-17 DIAGNOSIS — D2271 Melanocytic nevi of right lower limb, including hip: Secondary | ICD-10-CM | POA: Diagnosis not present

## 2020-12-18 ENCOUNTER — Other Ambulatory Visit (HOSPITAL_BASED_OUTPATIENT_CLINIC_OR_DEPARTMENT_OTHER): Payer: Self-pay

## 2020-12-18 DIAGNOSIS — G2581 Restless legs syndrome: Secondary | ICD-10-CM

## 2020-12-18 DIAGNOSIS — R0683 Snoring: Secondary | ICD-10-CM

## 2020-12-27 ENCOUNTER — Other Ambulatory Visit: Payer: Self-pay | Admitting: Internal Medicine

## 2020-12-31 DIAGNOSIS — I10 Essential (primary) hypertension: Secondary | ICD-10-CM | POA: Diagnosis not present

## 2020-12-31 DIAGNOSIS — G43009 Migraine without aura, not intractable, without status migrainosus: Secondary | ICD-10-CM | POA: Diagnosis not present

## 2020-12-31 DIAGNOSIS — Z298 Encounter for other specified prophylactic measures: Secondary | ICD-10-CM | POA: Diagnosis not present

## 2021-01-30 ENCOUNTER — Other Ambulatory Visit: Payer: Self-pay

## 2021-01-30 ENCOUNTER — Ambulatory Visit (HOSPITAL_BASED_OUTPATIENT_CLINIC_OR_DEPARTMENT_OTHER): Payer: Medicare Other | Attending: Internal Medicine | Admitting: Internal Medicine

## 2021-01-30 VITALS — Ht 68.5 in | Wt 170.0 lb

## 2021-01-30 DIAGNOSIS — G4710 Hypersomnia, unspecified: Secondary | ICD-10-CM | POA: Diagnosis not present

## 2021-01-30 DIAGNOSIS — R0683 Snoring: Secondary | ICD-10-CM

## 2021-01-31 ENCOUNTER — Other Ambulatory Visit (HOSPITAL_BASED_OUTPATIENT_CLINIC_OR_DEPARTMENT_OTHER): Payer: Self-pay

## 2021-01-31 DIAGNOSIS — I10 Essential (primary) hypertension: Secondary | ICD-10-CM | POA: Diagnosis not present

## 2021-01-31 DIAGNOSIS — M4316 Spondylolisthesis, lumbar region: Secondary | ICD-10-CM | POA: Diagnosis not present

## 2021-01-31 DIAGNOSIS — Z6825 Body mass index (BMI) 25.0-25.9, adult: Secondary | ICD-10-CM | POA: Diagnosis not present

## 2021-01-31 DIAGNOSIS — R0683 Snoring: Secondary | ICD-10-CM

## 2021-01-31 DIAGNOSIS — G2581 Restless legs syndrome: Secondary | ICD-10-CM

## 2021-02-02 DIAGNOSIS — G4710 Hypersomnia, unspecified: Secondary | ICD-10-CM | POA: Diagnosis not present

## 2021-02-02 NOTE — Procedures (Signed)
   NAME: Deborah Hudson DATE OF BIRTH:  1946-07-21 MEDICAL RECORD NUMBER [id number]  LOCATION: [facility]  PHYSICIAN: Marius Ditch  DATE OF STUDY: 01/30/2021  SLEEP STUDY TYPE: Nocturnal Polysomnogram               REFERRING PHYSICIAN: Marius Ditch, MD  EPWORTH SLEEPINESS SCORE:  2 HEIGHT: 5' 8.5" (174 cm)  WEIGHT: 170 lb (77.1 kg)    Body mass index is 25.47 kg/m.  NECK SIZE: 15 in.  CLINICAL INFORMATION The patient was referred to the sleep center for evaluation of non-refreshing sleep, snoring, FH of OSA  MEDICATIONS No sleep medicine administered.Marland Kitchen  SLEEP STUDY TECHNIQUE A multi-channel overnight Polysomnography study was performed. The channels recorded and monitored were central and occipital EEG, electrooculogram (EOG), submentalis EMG (chin), nasal and oral airflow, thoracic and abdominal wall motion, anterior tibialis EMG, snore microphone, electrocardiogram, and a pulse oximetry.  TECHNICAL COMMENTS Comments added by Technician: Patient was restless all through the night. Comments added by Scorer: N/A  SLEEP ARCHITECTURE The study was initiated at 10:27:19 PM and terminated at 5:13:10 AM. The total recorded time was 405.8 minutes. EEG confirmed total sleep time was 313 minutes yielding a sleep efficiency of 77.1%%. Sleep onset after lights out was 42.3 minutes with a REM latency of 92.5 minutes. The patient spent 7.3%% of the night in stage N1 sleep, 67.9%% in stage N2 sleep, 0.0%% in stage N3 and 24.8% in REM. Wake after sleep onset (WASO) was 50.5 minutes. The Arousal Index was 2.9/hour.  RESPIRATORY PARAMETERS There were a total of 4 respiratory disturbances out of which 0 were apneas ( 0 obstructive, 0 mixed, 0 central) and 4 hypopneas. The apnea/hypopnea index (AHI) was 0.8 events/hour. The central sleep apnea index was 0 events/hour. The REM AHI was 0.8 events/hour and NREM AHI was 0.8 events/hour. The supine AHI was 2.9 events/hour and  the non supine AHI was 0.2 events/hour. Respiratory Disturbance Index (RDI) was same as AHI.  Respiratory disturbances were associated with oxygen desaturation down to a nadir of 88.0% during sleep. The mean oxygen saturation during the study was 93.8%. The cumulative time under 88% oxygen saturation was 0.7 minutes.  LEG MOVEMENT DATA The total leg movements were 0 with a resulting leg movement index of 0.0/hr . Associated arousal with leg movement index was 0.0/hr.  CARDIAC DATA The underlying cardiac rhythm was most consistent with sinus rhythm. Mean heart rate during sleep was 71.5 bpm. Additional rhythm abnormalities include PVCs.  IMPRESSIONS - No Significant Obstructive Sleep apnea (OSA) - No significant periodic leg movements(PLMs) during sleep.   DIAGNOSIS - Excessive daytime sleepiness.   RECOMMENDATIONS - No indication for treatment of sleep disordered breathing seen on this study.   Marius Ditch Sleep specialist, [place] Board of [employer] Medicine  ELECTRONICALLY SIGNED ON:  02/02/2021, 8:24 PM [place] PH: [phone]   FX: [id number] [place]

## 2021-02-28 ENCOUNTER — Telehealth: Payer: Self-pay

## 2021-02-28 NOTE — Telephone Encounter (Signed)
I called patient to discuss her recent mychart message- spoke with patient who states this fluttering feeling occurred last night around 10:30 PM, it lasted about 40 minutes, then went away. She has had nothing since then, she denies chest pain, shortness of breath, swelling/weight gain, blood pressure a week ago was 124/83 and most recent blood pressure was 104/73 that was checked today.  Patient states that she wanted to make sure she is okay to wait until her appointment in May as she is overdue for an appointment.   Spoke with DOD Dr.Acharya who states she is having what looks like PVC's and if patient is not having symptoms she is okay to wait until appointment with Almyra Deforest, PA in May. She did suggest to make sure patient has updated labs. Patient states she had them 2 months ago but will recheck if needed. I advised we did not have those labs and asked her to have them send them to [place].  Patient verbalized understanding.   Will route to PA who will be seeing patient to see if he wants updated labs before appointment.

## 2021-03-05 DIAGNOSIS — Z961 Presence of intraocular lens: Secondary | ICD-10-CM | POA: Diagnosis not present

## 2021-03-05 DIAGNOSIS — H18513 Endothelial corneal dystrophy, bilateral: Secondary | ICD-10-CM | POA: Diagnosis not present

## 2021-03-06 DIAGNOSIS — N3000 Acute cystitis without hematuria: Secondary | ICD-10-CM | POA: Diagnosis not present

## 2021-03-06 DIAGNOSIS — Z01419 Encounter for gynecological examination (general) (routine) without abnormal findings: Secondary | ICD-10-CM | POA: Diagnosis not present

## 2021-03-06 DIAGNOSIS — Z1231 Encounter for screening mammogram for malignant neoplasm of breast: Secondary | ICD-10-CM | POA: Diagnosis not present

## 2021-03-06 NOTE — Telephone Encounter (Signed)
I have seen and read Deborah Hudson EKG strips from [employer] watch and has been replying to her patient messages separately. So far, no obvious significant arrhythmia, only thing I've seen on the Apple watch strip are PVCs which are considered harmless. Ok to keep current appt schedule.

## 2021-03-11 DIAGNOSIS — Z86010 Personal history of colonic polyps: Secondary | ICD-10-CM | POA: Diagnosis not present

## 2021-03-11 DIAGNOSIS — R198 Other specified symptoms and signs involving the digestive system and abdomen: Secondary | ICD-10-CM | POA: Diagnosis not present

## 2021-03-11 DIAGNOSIS — Z800 Family history of malignant neoplasm of digestive organs: Secondary | ICD-10-CM | POA: Diagnosis not present

## 2021-03-11 DIAGNOSIS — K5900 Constipation, unspecified: Secondary | ICD-10-CM | POA: Diagnosis not present

## 2021-03-12 NOTE — Telephone Encounter (Signed)
Reviewed by Dr. Claiborne Billings (DOD) 4/12, no recommendations at this time. Defer to [facility] PA.

## 2021-03-21 DIAGNOSIS — M79603 Pain in arm, unspecified: Secondary | ICD-10-CM | POA: Diagnosis not present

## 2021-03-21 DIAGNOSIS — I10 Essential (primary) hypertension: Secondary | ICD-10-CM | POA: Diagnosis not present

## 2021-03-21 DIAGNOSIS — R002 Palpitations: Secondary | ICD-10-CM | POA: Diagnosis not present

## 2021-03-27 DIAGNOSIS — N3941 Urge incontinence: Secondary | ICD-10-CM | POA: Diagnosis not present

## 2021-04-03 ENCOUNTER — Encounter: Payer: Self-pay | Admitting: Physician Assistant

## 2021-04-03 ENCOUNTER — Other Ambulatory Visit: Payer: Self-pay

## 2021-04-03 ENCOUNTER — Ambulatory Visit (INDEPENDENT_AMBULATORY_CARE_PROVIDER_SITE_OTHER): Payer: Medicare Other | Admitting: Physician Assistant

## 2021-04-03 VITALS — BP 120/68 | HR 78 | Ht 68.5 in | Wt 171.6 lb

## 2021-04-03 DIAGNOSIS — I493 Ventricular premature depolarization: Secondary | ICD-10-CM

## 2021-04-03 DIAGNOSIS — I10 Essential (primary) hypertension: Secondary | ICD-10-CM | POA: Diagnosis not present

## 2021-04-03 DIAGNOSIS — E785 Hyperlipidemia, unspecified: Secondary | ICD-10-CM | POA: Diagnosis not present

## 2021-04-03 NOTE — Progress Notes (Signed)
Cardiology Office Note:    Date:  04/04/2021   ID:  Deborah Hudson, DOB 09/10/1946, MRN [id number]  PCP:  Leeroy Cha, MD   [facility] Providers Cardiologist:  Kirk Ruths, MD {  Referring MD: Binnie Rail, MD   Chief Complaint  Patient presents with  . Follow-up    Seen for [name]    History of Present Illness:    Deborah Hudson is a 75 y.o. female with a hx of hypertension, hyperlipidemia and history of migraine.  Echocardiogram in January 2019 showed normal EF.  Lower extremity venous Doppler in June 2019 showed no DVT.   Patient initially contact cardiology service due to palpitation.  She borrowed her husband's apple watch and send [place] several strips to read.  Most of the EKG strips she has not noticed showed sinus rhythm with occasional PVCs.  I instructed the patient to cut back on caffeine.  Now she says her palpitation occurs about twice a day on average.  She denies any chest discomfort or worsening dyspnea.  EKG today continue to show sinus rhythm with PVCs.  We discussed with the patient possibility of Holter monitor and low-dose beta-blocker such as metoprolol succinate 25 mg daily as treatment for PVCs, she wished to hold off on this for the time being.  If her symptom does increase, then we will order a 3-day ZIO monitor and potentially place her on the above medication.  She will contact us if her symptoms become more frequent.  Note, patient is on high-dose aspirin due to arthritic issues.    Past Medical History:  Diagnosis Date  . Arthritis    hips  . Back pain   . Bladder infection    frequent /with Macrodantin usage  PRN  . Hyperlipidemia    borderline  . Hypertension   . Hypoglycemia    from DECREASED PROTEIN INTAKE  . Migraine headache    migraines  . Peripheral vascular disease ([place])    varicose veins/ with injections    Past Surgical History:  Procedure Laterality Date  . ANTERIOR LAT LUMBAR FUSION N/A 04/18/2018    Procedure: Lumbar Three and Lumbar Four anterior lateral decompression with lateral plate fixation;  Surgeon: Kristeen Miss, MD;  Location: [facility];  Service: Neurosurgery;  Laterality: N/A;  Lumbar Three and Lumbar Four anterior lateral decompression with lateral plate fixation  . BREAST SURGERY     bilateral breast lumpectomy  . CATARACT EXTRACTION Right 2011  . COLONOSCOPY    . CORNEAL TRANSPLANT Right 2011  . EYE SURGERY    . SHOULDER SURGERY Right 2015  . TOE SURGERY     right great toe scrapping for arthritis  . TOTAL HIP ARTHROPLASTY  02/17/2012   Procedure: TOTAL HIP ARTHROPLASTY;  Surgeon: Gearlean Alf, MD;  Location: [facility];  Service: Orthopedics;  Laterality: Right;  . TOTAL HIP ARTHROPLASTY Left 10/04/2019   Procedure: TOTAL HIP ARTHROPLASTY ANTERIOR APPROACH;  Surgeon: Gaynelle Arabian, MD;  Location: [facility];  Service: Orthopedics;  Laterality: Left;    Current Medications: Current Meds  Medication Sig  . aspirin 325 MG tablet Take 325 mg by mouth daily.  . baclofen (LIORESAL) 10 MG tablet Take 5 mg by mouth daily.  . Biotin 10 MG TABS 1 tablet  . BOSWELLIA SERRATA PO Take by mouth.  . Carboxymethylcellul-Glycerin (LUBRICATING EYE DROPS OP) Place 1 drop into both eyes 2 (two) times daily as needed (dry eyes).   . ciprofloxacin (CIPRO) 500 MG  tablet Take by mouth as needed.  . Cranberry 300 MG tablet Take by mouth.  . D-Mannose 500 MG CAPS See admin instructions.  Marland Kitchen dexamethasone (DECADRON) 4 MG tablet as needed.  Marland Kitchen ELDERBERRY PO Take by mouth.  . estradiol (ESTRACE VAGINAL) 0.1 MG/GM vaginal cream Place 1 Applicatorful vaginally at bedtime. For two weeks then twice a week  . famotidine (PEPCID) 20 MG tablet Take by mouth daily.  . Ginkgo 60 MG TABS Take by mouth in the morning and at bedtime.  Marland Kitchen ketoconazole (NIZORAL) 2 % cream as needed.  . loteprednol (LOTEMAX) 0.5 % ophthalmic suspension Place 1 drop into the right eye daily.  . Omega-3 Fatty Acids (FISH OIL) 1000 MG  CAPS 1 capsule  . Potassium 75 MG TABS Take by mouth.  . Probiotic Product (PROBIOTIC-10 PO) See admin instructions.  . SUMAtriptan (IMITREX) 100 MG tablet Take 1 tablet (100 mg total) by mouth every 2 (two) hours as needed. For migraine  . triamterene-hydrochlorothiazide (MAXZIDE-25) 37.5-25 MG tablet Take 1 tablet by mouth daily.  . trospium (SANCTURA) 20 MG tablet Take 60 mg by mouth daily.  . Turmeric, Curcuma Longa, (CURCUMIN) POWD Take by mouth daily.  . [DISCONTINUED] aspirin EC 325 MG EC tablet Take 1 tablet (325 mg total) by mouth 2 (two) times daily. (Patient taking differently: Take 325 mg by mouth at bedtime.)     Allergies:   Sulfa antibiotics and Adhesive [tape]   Social History   Socioeconomic History  . Marital status: Married    Spouse name: Not on file  . Number of children: 2  . Years of education: Not on file  . Highest education level: Not on file  Occupational History  . Occupation: retired    Comment: Chief Financial Officer  Tobacco Use  . Smoking status: Never Smoker  . Smokeless tobacco: Never Used  Vaping Use  . Vaping Use: Never used  Substance and Sexual Activity  . Alcohol use: Yes    Alcohol/week: 2.0 standard drinks    Types: 2 Glasses of wine per week    Comment: Rare  . Drug use: No  . Sexual activity: Yes  Other Topics Concern  . Not on file  Social History Narrative  . Not on file   Social Determinants of Health   Financial Resource Strain: Not on file  Food Insecurity: Not on file  Transportation Needs: Not on file  Physical Activity: Not on file  Stress: Not on file  Social Connections: Not on file     Family History: The patient's family history includes Alzheimer's disease in her mother; Cancer in her maternal grandfather and mother; Colon cancer in her mother; Colon polyps in her mother; Depression in her mother; Stroke in her father and maternal grandmother.  ROS:   Please see the history of present illness.     All  other systems reviewed and are negative.  EKGs/Labs/Other Studies Reviewed:    The following studies were reviewed today:  Echo 12/03/2017 LV EF: 55% -  60%   -------------------------------------------------------------------  Indications:   (R60.0).   -------------------------------------------------------------------  History:  PMH: Lower extremity edema. Acquired from the patient  and from the patient&'s chart. Risk factors: Hypertension.  Dyslipidemia.   -------------------------------------------------------------------  Study Conclusions   - Left ventricle: The cavity size was normal. Systolic function was  normal. The estimated ejection fraction was in the range of 55%  to 60%. Wall motion was normal; there were no regional wall  motion abnormalities.  -  Inferior vena cava: The vessel was normal in size. The  respirophasic diameter changes were in the normal range (>= 50%),  consistent with normal central venous pressure.   EKG:  EKG is ordered today.  The ekg ordered today demonstrates sinus rhythm with PACs  Recent Labs: No results found for requested labs within last 8760 hours.  Recent Lipid Panel    Component Value Date/Time   CHOL 166 01/07/2017 0721   TRIG 79.0 01/07/2017 0721   HDL 41.90 01/07/2017 0721   CHOLHDL 4 01/07/2017 0721   VLDL 15.8 01/07/2017 0721   LDLCALC 109 (H) 01/07/2017 0721     Risk Assessment/Calculations:       Physical Exam:    VS:  BP 120/68   Pulse 78   Ht 5' 8.5" (1.74 m)   Wt 171 lb 9.6 oz (77.8 kg)   BMI 25.71 kg/m     Wt Readings from Last 3 Encounters:  04/03/21 171 lb 9.6 oz (77.8 kg)  01/31/21 170 lb (77.1 kg)  10/31/19 186 lb 9.6 oz (84.6 kg)     GEN:  Well nourished, well developed in no acute distress HEENT: Normal NECK: No JVD; No carotid bruits LYMPHATICS: No lymphadenopathy CARDIAC: RRR, no murmurs, rubs, gallops RESPIRATORY:  Clear to auscultation without rales, wheezing or rhonchi   ABDOMEN: Soft, non-tender, non-distended MUSCULOSKELETAL:  No edema; No deformity  SKIN: Warm and dry NEUROLOGIC:  Alert and oriented x 3 PSYCHIATRIC:  Normal affect   ASSESSMENT:    1. PVC (premature ventricular contraction)   2. Primary hypertension   3. Hyperlipidemia LDL goal <100    PLAN:    In order of problems listed above:  1. PVCs: Apple Watch recording recently has not shown any atrial fibrillation.  She continued to have twice a day episode of palpitation, we discussed possibility of doing a 3-day heart monitor to assess PVC burden and add low-dose beta-blocker, however she wished to hold off on this for the time being.  I did reassure her that PVCs are usually considered quite benign and it is quite reasonable to continue observation.    2. Hypertension: Blood pressure stable  3. Hyperlipidemia: Diet and exercise        Medication Adjustments/Labs and Tests Ordered: Current medicines are reviewed at length with the patient today.  Concerns regarding medicines are outlined above.  Orders Placed This Encounter  Procedures  . EKG 12-Lead   No orders of the defined types were placed in this encounter.   Patient Instructions  Medication Instructions:  Your physician recommends that you continue on your current medications as directed. Please refer to the Current Medication list given to you today.  *If you need a refill on your cardiac medications before your next appointment, please call your pharmacy*  Lab Work: NONE ordered at this time of appointment   If you have labs (blood work) drawn today and your tests are completely normal, you will receive your results only by: Marland Kitchen MyChart Message (if you have MyChart) OR . A paper copy in the mail If you have any lab test that is abnormal or we need to change your treatment, we will call you to review the results.  Testing/Procedures: NONE ordered at this time of appointment   Follow-Up: At [facility], you  and your health needs are our priority.  As part of our continuing mission to provide you with exceptional heart care, we have created designated Provider Care Teams.  These Care Teams include  your primary Cardiologist (physician) and Advanced Practice Providers (APPs -  Physician Assistants and Nurse Practitioners) who all work together to provide you with the care you need, when you need it.  Your next appointment:   5-6 month(s)  The format for your next appointment:   In Person  Provider:   Kirk Ruths, MD  Other Instructions  If palpitations reoccur please send a MyChart message.      Hilbert Corrigan, [place]  04/04/2021 11:28 PM    [facility]

## 2021-04-03 NOTE — Patient Instructions (Signed)
Medication Instructions:  Your physician recommends that you continue on your current medications as directed. Please refer to the Current Medication list given to you today.  *If you need a refill on your cardiac medications before your next appointment, please call your pharmacy*  Lab Work: NONE ordered at this time of appointment   If you have labs (blood work) drawn today and your tests are completely normal, you will receive your results only by: Marland Kitchen MyChart Message (if you have MyChart) OR . A paper copy in the mail If you have any lab test that is abnormal or we need to change your treatment, we will call you to review the results.  Testing/Procedures: NONE ordered at this time of appointment   Follow-Up: At [facility], you and your health needs are our priority.  As part of our continuing mission to provide you with exceptional heart care, we have created designated Provider Care Teams.  These Care Teams include your primary Cardiologist (physician) and Advanced Practice Providers (APPs -  Physician Assistants and Nurse Practitioners) who all work together to provide you with the care you need, when you need it.  Your next appointment:   5-6 month(s)  The format for your next appointment:   In Person  Provider:   Kirk Ruths, MD  Other Instructions  If palpitations reoccur please send a MyChart message.

## 2021-04-04 ENCOUNTER — Encounter: Payer: Self-pay | Admitting: Physician Assistant

## 2021-04-08 DIAGNOSIS — Z23 Encounter for immunization: Secondary | ICD-10-CM | POA: Diagnosis not present

## 2021-05-16 DIAGNOSIS — S86311A Strain of muscle(s) and tendon(s) of peroneal muscle group at lower leg level, right leg, initial encounter: Secondary | ICD-10-CM | POA: Diagnosis not present

## 2021-05-20 DIAGNOSIS — I10 Essential (primary) hypertension: Secondary | ICD-10-CM | POA: Diagnosis not present

## 2021-05-20 DIAGNOSIS — J0101 Acute recurrent maxillary sinusitis: Secondary | ICD-10-CM | POA: Diagnosis not present

## 2021-05-22 ENCOUNTER — Ambulatory Visit: Payer: Medicare Other | Admitting: Family Medicine

## 2021-05-23 DIAGNOSIS — M7061 Trochanteric bursitis, right hip: Secondary | ICD-10-CM | POA: Diagnosis not present

## 2021-05-23 DIAGNOSIS — Z96641 Presence of right artificial hip joint: Secondary | ICD-10-CM | POA: Diagnosis not present

## 2021-06-10 DIAGNOSIS — I10 Essential (primary) hypertension: Secondary | ICD-10-CM | POA: Diagnosis not present

## 2021-06-10 DIAGNOSIS — Z23 Encounter for immunization: Secondary | ICD-10-CM | POA: Diagnosis not present

## 2021-06-10 DIAGNOSIS — E559 Vitamin D deficiency, unspecified: Secondary | ICD-10-CM | POA: Diagnosis not present

## 2021-06-10 DIAGNOSIS — G43009 Migraine without aura, not intractable, without status migrainosus: Secondary | ICD-10-CM | POA: Diagnosis not present

## 2021-06-20 DIAGNOSIS — N390 Urinary tract infection, site not specified: Secondary | ICD-10-CM | POA: Diagnosis not present

## 2021-06-30 DIAGNOSIS — N3000 Acute cystitis without hematuria: Secondary | ICD-10-CM | POA: Diagnosis not present

## 2021-06-30 DIAGNOSIS — R300 Dysuria: Secondary | ICD-10-CM | POA: Diagnosis not present

## 2021-07-04 DIAGNOSIS — M7061 Trochanteric bursitis, right hip: Secondary | ICD-10-CM | POA: Diagnosis not present

## 2021-08-07 DIAGNOSIS — Z86010 Personal history of colonic polyps: Secondary | ICD-10-CM | POA: Diagnosis not present

## 2021-08-07 DIAGNOSIS — Z800 Family history of malignant neoplasm of digestive organs: Secondary | ICD-10-CM | POA: Diagnosis not present

## 2021-08-07 DIAGNOSIS — K621 Rectal polyp: Secondary | ICD-10-CM | POA: Diagnosis not present

## 2021-08-12 DIAGNOSIS — K621 Rectal polyp: Secondary | ICD-10-CM | POA: Diagnosis not present

## 2021-09-25 DIAGNOSIS — R350 Frequency of micturition: Secondary | ICD-10-CM | POA: Diagnosis not present

## 2021-09-25 DIAGNOSIS — R82998 Other abnormal findings in urine: Secondary | ICD-10-CM | POA: Diagnosis not present

## 2021-09-25 DIAGNOSIS — Z23 Encounter for immunization: Secondary | ICD-10-CM | POA: Diagnosis not present

## 2021-09-25 DIAGNOSIS — N3000 Acute cystitis without hematuria: Secondary | ICD-10-CM | POA: Diagnosis not present

## 2021-10-09 DIAGNOSIS — N3941 Urge incontinence: Secondary | ICD-10-CM | POA: Diagnosis not present

## 2021-10-20 DIAGNOSIS — Z23 Encounter for immunization: Secondary | ICD-10-CM | POA: Diagnosis not present

## 2021-10-27 DIAGNOSIS — R300 Dysuria: Secondary | ICD-10-CM | POA: Diagnosis not present

## 2021-10-27 DIAGNOSIS — N3941 Urge incontinence: Secondary | ICD-10-CM | POA: Diagnosis not present

## 2021-11-06 DIAGNOSIS — L720 Epidermal cyst: Secondary | ICD-10-CM | POA: Diagnosis not present

## 2021-11-20 DIAGNOSIS — L720 Epidermal cyst: Secondary | ICD-10-CM | POA: Diagnosis not present

## 2021-12-11 DIAGNOSIS — Z1389 Encounter for screening for other disorder: Secondary | ICD-10-CM | POA: Diagnosis not present

## 2021-12-11 DIAGNOSIS — N3941 Urge incontinence: Secondary | ICD-10-CM | POA: Diagnosis not present

## 2021-12-11 DIAGNOSIS — G4761 Periodic limb movement disorder: Secondary | ICD-10-CM | POA: Diagnosis not present

## 2021-12-11 DIAGNOSIS — Z86010 Personal history of colonic polyps: Secondary | ICD-10-CM | POA: Diagnosis not present

## 2021-12-11 DIAGNOSIS — Z800 Family history of malignant neoplasm of digestive organs: Secondary | ICD-10-CM | POA: Diagnosis not present

## 2021-12-11 DIAGNOSIS — I10 Essential (primary) hypertension: Secondary | ICD-10-CM | POA: Diagnosis not present

## 2021-12-11 DIAGNOSIS — G43009 Migraine without aura, not intractable, without status migrainosus: Secondary | ICD-10-CM | POA: Diagnosis not present

## 2021-12-11 DIAGNOSIS — E559 Vitamin D deficiency, unspecified: Secondary | ICD-10-CM | POA: Diagnosis not present

## 2021-12-11 DIAGNOSIS — Z23 Encounter for immunization: Secondary | ICD-10-CM | POA: Diagnosis not present

## 2021-12-11 DIAGNOSIS — Z7189 Other specified counseling: Secondary | ICD-10-CM | POA: Diagnosis not present

## 2021-12-11 DIAGNOSIS — Z79899 Other long term (current) drug therapy: Secondary | ICD-10-CM | POA: Diagnosis not present

## 2021-12-11 DIAGNOSIS — Z0000 Encounter for general adult medical examination without abnormal findings: Secondary | ICD-10-CM | POA: Diagnosis not present

## 2021-12-17 DIAGNOSIS — D1801 Hemangioma of skin and subcutaneous tissue: Secondary | ICD-10-CM | POA: Diagnosis not present

## 2021-12-17 DIAGNOSIS — D225 Melanocytic nevi of trunk: Secondary | ICD-10-CM | POA: Diagnosis not present

## 2021-12-17 DIAGNOSIS — D2272 Melanocytic nevi of left lower limb, including hip: Secondary | ICD-10-CM | POA: Diagnosis not present

## 2021-12-17 DIAGNOSIS — D2262 Melanocytic nevi of left upper limb, including shoulder: Secondary | ICD-10-CM | POA: Diagnosis not present

## 2021-12-17 DIAGNOSIS — D2271 Melanocytic nevi of right lower limb, including hip: Secondary | ICD-10-CM | POA: Diagnosis not present

## 2021-12-17 DIAGNOSIS — L812 Freckles: Secondary | ICD-10-CM | POA: Diagnosis not present

## 2021-12-17 DIAGNOSIS — L821 Other seborrheic keratosis: Secondary | ICD-10-CM | POA: Diagnosis not present

## 2021-12-17 DIAGNOSIS — L720 Epidermal cyst: Secondary | ICD-10-CM | POA: Diagnosis not present

## 2021-12-17 DIAGNOSIS — H1131 Conjunctival hemorrhage, right eye: Secondary | ICD-10-CM | POA: Diagnosis not present

## 2021-12-17 DIAGNOSIS — L570 Actinic keratosis: Secondary | ICD-10-CM | POA: Diagnosis not present

## 2022-02-05 DIAGNOSIS — L720 Epidermal cyst: Secondary | ICD-10-CM | POA: Diagnosis not present

## 2022-03-05 DIAGNOSIS — M26601 Right temporomandibular joint disorder, unspecified: Secondary | ICD-10-CM | POA: Diagnosis not present

## 2022-03-10 DIAGNOSIS — Z1231 Encounter for screening mammogram for malignant neoplasm of breast: Secondary | ICD-10-CM | POA: Diagnosis not present

## 2022-03-10 DIAGNOSIS — Z780 Asymptomatic menopausal state: Secondary | ICD-10-CM | POA: Diagnosis not present

## 2022-03-10 DIAGNOSIS — L720 Epidermal cyst: Secondary | ICD-10-CM | POA: Diagnosis not present

## 2022-03-10 DIAGNOSIS — R300 Dysuria: Secondary | ICD-10-CM | POA: Diagnosis not present

## 2022-04-06 DIAGNOSIS — M25512 Pain in left shoulder: Secondary | ICD-10-CM | POA: Diagnosis not present

## 2022-04-06 DIAGNOSIS — M19011 Primary osteoarthritis, right shoulder: Secondary | ICD-10-CM | POA: Diagnosis not present

## 2022-04-07 DIAGNOSIS — H2513 Age-related nuclear cataract, bilateral: Secondary | ICD-10-CM | POA: Diagnosis not present

## 2022-04-23 DIAGNOSIS — L720 Epidermal cyst: Secondary | ICD-10-CM | POA: Diagnosis not present

## 2022-05-07 DIAGNOSIS — N3941 Urge incontinence: Secondary | ICD-10-CM | POA: Diagnosis not present

## 2022-06-09 DIAGNOSIS — N3001 Acute cystitis with hematuria: Secondary | ICD-10-CM | POA: Diagnosis not present

## 2022-06-09 DIAGNOSIS — I10 Essential (primary) hypertension: Secondary | ICD-10-CM | POA: Diagnosis not present

## 2022-06-09 DIAGNOSIS — Z6826 Body mass index (BMI) 26.0-26.9, adult: Secondary | ICD-10-CM | POA: Diagnosis not present

## 2022-06-09 DIAGNOSIS — R300 Dysuria: Secondary | ICD-10-CM | POA: Diagnosis not present

## 2022-06-11 DIAGNOSIS — R143 Flatulence: Secondary | ICD-10-CM | POA: Diagnosis not present

## 2022-06-11 DIAGNOSIS — G43009 Migraine without aura, not intractable, without status migrainosus: Secondary | ICD-10-CM | POA: Diagnosis not present

## 2022-06-11 DIAGNOSIS — E559 Vitamin D deficiency, unspecified: Secondary | ICD-10-CM | POA: Diagnosis not present

## 2022-06-11 DIAGNOSIS — I10 Essential (primary) hypertension: Secondary | ICD-10-CM | POA: Diagnosis not present

## 2022-07-24 DIAGNOSIS — R300 Dysuria: Secondary | ICD-10-CM | POA: Diagnosis not present

## 2022-07-24 DIAGNOSIS — I10 Essential (primary) hypertension: Secondary | ICD-10-CM | POA: Diagnosis not present

## 2022-07-24 DIAGNOSIS — N3941 Urge incontinence: Secondary | ICD-10-CM | POA: Diagnosis not present

## 2022-07-24 DIAGNOSIS — Z90710 Acquired absence of both cervix and uterus: Secondary | ICD-10-CM | POA: Diagnosis not present

## 2022-09-11 DIAGNOSIS — Z23 Encounter for immunization: Secondary | ICD-10-CM | POA: Diagnosis not present

## 2022-09-16 DIAGNOSIS — M4316 Spondylolisthesis, lumbar region: Secondary | ICD-10-CM | POA: Diagnosis not present

## 2022-09-16 DIAGNOSIS — Z6826 Body mass index (BMI) 26.0-26.9, adult: Secondary | ICD-10-CM | POA: Diagnosis not present

## 2022-10-03 DIAGNOSIS — Z23 Encounter for immunization: Secondary | ICD-10-CM | POA: Diagnosis not present

## 2022-10-06 DIAGNOSIS — R3915 Urgency of urination: Secondary | ICD-10-CM | POA: Diagnosis not present

## 2022-10-06 DIAGNOSIS — Z789 Other specified health status: Secondary | ICD-10-CM | POA: Diagnosis not present

## 2022-10-06 DIAGNOSIS — I10 Essential (primary) hypertension: Secondary | ICD-10-CM | POA: Diagnosis not present

## 2022-10-06 DIAGNOSIS — R300 Dysuria: Secondary | ICD-10-CM | POA: Diagnosis not present

## 2022-10-06 DIAGNOSIS — R350 Frequency of micturition: Secondary | ICD-10-CM | POA: Diagnosis not present

## 2022-10-06 DIAGNOSIS — B9620 Unspecified Escherichia coli [E. coli] as the cause of diseases classified elsewhere: Secondary | ICD-10-CM | POA: Diagnosis not present

## 2022-10-15 DIAGNOSIS — R350 Frequency of micturition: Secondary | ICD-10-CM | POA: Diagnosis not present

## 2022-10-15 DIAGNOSIS — I10 Essential (primary) hypertension: Secondary | ICD-10-CM | POA: Diagnosis not present

## 2022-10-15 DIAGNOSIS — Z6827 Body mass index (BMI) 27.0-27.9, adult: Secondary | ICD-10-CM | POA: Diagnosis not present

## 2022-10-15 DIAGNOSIS — N3001 Acute cystitis with hematuria: Secondary | ICD-10-CM | POA: Diagnosis not present

## 2022-10-28 DIAGNOSIS — Z90710 Acquired absence of both cervix and uterus: Secondary | ICD-10-CM | POA: Diagnosis not present

## 2022-10-28 DIAGNOSIS — N390 Urinary tract infection, site not specified: Secondary | ICD-10-CM | POA: Diagnosis not present

## 2022-11-10 DIAGNOSIS — R350 Frequency of micturition: Secondary | ICD-10-CM | POA: Diagnosis not present

## 2022-11-25 DIAGNOSIS — N952 Postmenopausal atrophic vaginitis: Secondary | ICD-10-CM | POA: Diagnosis not present

## 2022-11-25 DIAGNOSIS — R309 Painful micturition, unspecified: Secondary | ICD-10-CM | POA: Diagnosis not present

## 2022-11-25 DIAGNOSIS — R300 Dysuria: Secondary | ICD-10-CM | POA: Diagnosis not present

## 2022-11-25 DIAGNOSIS — R350 Frequency of micturition: Secondary | ICD-10-CM | POA: Diagnosis not present

## 2022-12-02 DIAGNOSIS — M19011 Primary osteoarthritis, right shoulder: Secondary | ICD-10-CM | POA: Diagnosis not present

## 2022-12-03 DIAGNOSIS — N3941 Urge incontinence: Secondary | ICD-10-CM | POA: Diagnosis not present

## 2022-12-29 DIAGNOSIS — I10 Essential (primary) hypertension: Secondary | ICD-10-CM | POA: Diagnosis not present

## 2022-12-29 DIAGNOSIS — N3001 Acute cystitis with hematuria: Secondary | ICD-10-CM | POA: Diagnosis not present

## 2022-12-29 DIAGNOSIS — R350 Frequency of micturition: Secondary | ICD-10-CM | POA: Diagnosis not present

## 2022-12-29 DIAGNOSIS — Z6827 Body mass index (BMI) 27.0-27.9, adult: Secondary | ICD-10-CM | POA: Diagnosis not present

## 2022-12-31 DIAGNOSIS — L821 Other seborrheic keratosis: Secondary | ICD-10-CM | POA: Diagnosis not present

## 2022-12-31 DIAGNOSIS — D225 Melanocytic nevi of trunk: Secondary | ICD-10-CM | POA: Diagnosis not present

## 2022-12-31 DIAGNOSIS — L820 Inflamed seborrheic keratosis: Secondary | ICD-10-CM | POA: Diagnosis not present

## 2022-12-31 DIAGNOSIS — D1801 Hemangioma of skin and subcutaneous tissue: Secondary | ICD-10-CM | POA: Diagnosis not present

## 2022-12-31 DIAGNOSIS — L812 Freckles: Secondary | ICD-10-CM | POA: Diagnosis not present

## 2022-12-31 DIAGNOSIS — D2262 Melanocytic nevi of left upper limb, including shoulder: Secondary | ICD-10-CM | POA: Diagnosis not present

## 2023-01-08 DIAGNOSIS — I10 Essential (primary) hypertension: Secondary | ICD-10-CM | POA: Diagnosis not present

## 2023-01-08 DIAGNOSIS — B379 Candidiasis, unspecified: Secondary | ICD-10-CM | POA: Diagnosis not present

## 2023-01-08 DIAGNOSIS — N3000 Acute cystitis without hematuria: Secondary | ICD-10-CM | POA: Diagnosis not present

## 2023-01-08 DIAGNOSIS — Z6827 Body mass index (BMI) 27.0-27.9, adult: Secondary | ICD-10-CM | POA: Diagnosis not present

## 2023-02-16 DIAGNOSIS — Z23 Encounter for immunization: Secondary | ICD-10-CM | POA: Diagnosis not present

## 2023-03-16 DIAGNOSIS — N390 Urinary tract infection, site not specified: Secondary | ICD-10-CM | POA: Diagnosis not present

## 2023-03-22 DIAGNOSIS — N3020 Other chronic cystitis without hematuria: Secondary | ICD-10-CM | POA: Diagnosis not present

## 2023-03-26 DIAGNOSIS — N3020 Other chronic cystitis without hematuria: Secondary | ICD-10-CM | POA: Diagnosis not present

## 2023-03-26 DIAGNOSIS — N390 Urinary tract infection, site not specified: Secondary | ICD-10-CM | POA: Diagnosis not present

## 2023-04-09 DIAGNOSIS — Z6827 Body mass index (BMI) 27.0-27.9, adult: Secondary | ICD-10-CM | POA: Diagnosis not present

## 2023-04-09 DIAGNOSIS — M4316 Spondylolisthesis, lumbar region: Secondary | ICD-10-CM | POA: Diagnosis not present

## 2023-04-12 DIAGNOSIS — M5416 Radiculopathy, lumbar region: Secondary | ICD-10-CM | POA: Diagnosis not present

## 2023-04-12 DIAGNOSIS — M5116 Intervertebral disc disorders with radiculopathy, lumbar region: Secondary | ICD-10-CM | POA: Diagnosis not present

## 2023-04-21 DIAGNOSIS — M4316 Spondylolisthesis, lumbar region: Secondary | ICD-10-CM | POA: Diagnosis not present

## 2023-04-21 DIAGNOSIS — Z6827 Body mass index (BMI) 27.0-27.9, adult: Secondary | ICD-10-CM | POA: Diagnosis not present

## 2023-04-29 DIAGNOSIS — R319 Hematuria, unspecified: Secondary | ICD-10-CM | POA: Diagnosis not present

## 2023-05-04 DIAGNOSIS — K5900 Constipation, unspecified: Secondary | ICD-10-CM | POA: Diagnosis not present

## 2023-05-04 DIAGNOSIS — N390 Urinary tract infection, site not specified: Secondary | ICD-10-CM | POA: Diagnosis not present

## 2023-05-19 DIAGNOSIS — R399 Unspecified symptoms and signs involving the genitourinary system: Secondary | ICD-10-CM | POA: Diagnosis not present

## 2023-05-25 DIAGNOSIS — K5900 Constipation, unspecified: Secondary | ICD-10-CM | POA: Diagnosis not present

## 2023-05-25 DIAGNOSIS — K635 Polyp of colon: Secondary | ICD-10-CM | POA: Diagnosis not present

## 2023-05-25 DIAGNOSIS — R252 Cramp and spasm: Secondary | ICD-10-CM | POA: Diagnosis not present

## 2023-05-25 DIAGNOSIS — I10 Essential (primary) hypertension: Secondary | ICD-10-CM | POA: Diagnosis not present

## 2023-05-28 DIAGNOSIS — H18512 Endothelial corneal dystrophy, left eye: Secondary | ICD-10-CM | POA: Diagnosis not present

## 2023-06-04 DIAGNOSIS — N3000 Acute cystitis without hematuria: Secondary | ICD-10-CM | POA: Diagnosis not present

## 2023-07-12 DIAGNOSIS — R399 Unspecified symptoms and signs involving the genitourinary system: Secondary | ICD-10-CM | POA: Diagnosis not present

## 2023-08-03 DIAGNOSIS — Z23 Encounter for immunization: Secondary | ICD-10-CM | POA: Diagnosis not present

## 2023-08-03 DIAGNOSIS — H903 Sensorineural hearing loss, bilateral: Secondary | ICD-10-CM | POA: Diagnosis not present

## 2023-08-05 DIAGNOSIS — M1712 Unilateral primary osteoarthritis, left knee: Secondary | ICD-10-CM | POA: Diagnosis not present

## 2023-08-09 DIAGNOSIS — N390 Urinary tract infection, site not specified: Secondary | ICD-10-CM | POA: Diagnosis not present

## 2023-09-03 DIAGNOSIS — G4761 Periodic limb movement disorder: Secondary | ICD-10-CM | POA: Diagnosis not present

## 2023-09-03 DIAGNOSIS — R609 Edema, unspecified: Secondary | ICD-10-CM | POA: Diagnosis not present

## 2023-09-03 DIAGNOSIS — I10 Essential (primary) hypertension: Secondary | ICD-10-CM | POA: Diagnosis not present

## 2023-09-20 DIAGNOSIS — E785 Hyperlipidemia, unspecified: Secondary | ICD-10-CM | POA: Diagnosis not present

## 2023-09-20 DIAGNOSIS — K5900 Constipation, unspecified: Secondary | ICD-10-CM | POA: Diagnosis not present

## 2023-09-20 DIAGNOSIS — Z90710 Acquired absence of both cervix and uterus: Secondary | ICD-10-CM | POA: Diagnosis not present

## 2023-09-20 DIAGNOSIS — I10 Essential (primary) hypertension: Secondary | ICD-10-CM | POA: Diagnosis not present

## 2023-09-20 DIAGNOSIS — Z0000 Encounter for general adult medical examination without abnormal findings: Secondary | ICD-10-CM | POA: Diagnosis not present

## 2023-09-20 DIAGNOSIS — G43009 Migraine without aura, not intractable, without status migrainosus: Secondary | ICD-10-CM | POA: Diagnosis not present

## 2023-09-20 DIAGNOSIS — E559 Vitamin D deficiency, unspecified: Secondary | ICD-10-CM | POA: Diagnosis not present

## 2023-09-22 DIAGNOSIS — Z23 Encounter for immunization: Secondary | ICD-10-CM | POA: Diagnosis not present

## 2023-11-15 DIAGNOSIS — N390 Urinary tract infection, site not specified: Secondary | ICD-10-CM | POA: Diagnosis not present

## 2023-11-16 DIAGNOSIS — H6121 Impacted cerumen, right ear: Secondary | ICD-10-CM | POA: Diagnosis not present

## 2023-11-16 DIAGNOSIS — Z6826 Body mass index (BMI) 26.0-26.9, adult: Secondary | ICD-10-CM | POA: Diagnosis not present

## 2023-12-10 DIAGNOSIS — E785 Hyperlipidemia, unspecified: Secondary | ICD-10-CM | POA: Diagnosis not present

## 2023-12-10 DIAGNOSIS — I10 Essential (primary) hypertension: Secondary | ICD-10-CM | POA: Diagnosis not present

## 2024-01-17 DIAGNOSIS — D1801 Hemangioma of skin and subcutaneous tissue: Secondary | ICD-10-CM | POA: Diagnosis not present

## 2024-01-17 DIAGNOSIS — L820 Inflamed seborrheic keratosis: Secondary | ICD-10-CM | POA: Diagnosis not present

## 2024-01-17 DIAGNOSIS — L821 Other seborrheic keratosis: Secondary | ICD-10-CM | POA: Diagnosis not present

## 2024-01-17 DIAGNOSIS — L812 Freckles: Secondary | ICD-10-CM | POA: Diagnosis not present

## 2024-01-17 DIAGNOSIS — L720 Epidermal cyst: Secondary | ICD-10-CM | POA: Diagnosis not present

## 2024-01-17 DIAGNOSIS — B070 Plantar wart: Secondary | ICD-10-CM | POA: Diagnosis not present

## 2024-02-11 DIAGNOSIS — M5416 Radiculopathy, lumbar region: Secondary | ICD-10-CM | POA: Diagnosis not present

## 2024-02-11 DIAGNOSIS — M5116 Intervertebral disc disorders with radiculopathy, lumbar region: Secondary | ICD-10-CM | POA: Diagnosis not present

## 2024-02-21 DIAGNOSIS — Z90710 Acquired absence of both cervix and uterus: Secondary | ICD-10-CM | POA: Diagnosis not present

## 2024-02-21 DIAGNOSIS — G43009 Migraine without aura, not intractable, without status migrainosus: Secondary | ICD-10-CM | POA: Diagnosis not present

## 2024-02-21 DIAGNOSIS — N390 Urinary tract infection, site not specified: Secondary | ICD-10-CM | POA: Diagnosis not present

## 2024-02-21 DIAGNOSIS — Z111 Encounter for screening for respiratory tuberculosis: Secondary | ICD-10-CM | POA: Diagnosis not present

## 2024-02-21 DIAGNOSIS — N3941 Urge incontinence: Secondary | ICD-10-CM | POA: Diagnosis not present

## 2024-02-21 DIAGNOSIS — I10 Essential (primary) hypertension: Secondary | ICD-10-CM | POA: Diagnosis not present

## 2024-02-21 DIAGNOSIS — E785 Hyperlipidemia, unspecified: Secondary | ICD-10-CM | POA: Diagnosis not present

## 2024-02-25 DIAGNOSIS — N390 Urinary tract infection, site not specified: Secondary | ICD-10-CM | POA: Diagnosis not present

## 2024-02-29 DIAGNOSIS — R7612 Nonspecific reaction to cell mediated immunity measurement of gamma interferon antigen response without active tuberculosis: Secondary | ICD-10-CM | POA: Diagnosis not present

## 2024-03-20 DIAGNOSIS — R252 Cramp and spasm: Secondary | ICD-10-CM | POA: Diagnosis not present

## 2024-03-20 DIAGNOSIS — R300 Dysuria: Secondary | ICD-10-CM | POA: Diagnosis not present

## 2024-03-30 DIAGNOSIS — Z01411 Encounter for gynecological examination (general) (routine) with abnormal findings: Secondary | ICD-10-CM | POA: Diagnosis not present

## 2024-03-30 DIAGNOSIS — N390 Urinary tract infection, site not specified: Secondary | ICD-10-CM | POA: Diagnosis not present

## 2024-03-30 DIAGNOSIS — Z1231 Encounter for screening mammogram for malignant neoplasm of breast: Secondary | ICD-10-CM | POA: Diagnosis not present

## 2024-04-27 DIAGNOSIS — Z03818 Encounter for observation for suspected exposure to other biological agents ruled out: Secondary | ICD-10-CM | POA: Diagnosis not present

## 2024-04-27 DIAGNOSIS — J029 Acute pharyngitis, unspecified: Secondary | ICD-10-CM | POA: Diagnosis not present

## 2024-04-27 DIAGNOSIS — R252 Cramp and spasm: Secondary | ICD-10-CM | POA: Diagnosis not present

## 2024-04-27 DIAGNOSIS — J069 Acute upper respiratory infection, unspecified: Secondary | ICD-10-CM | POA: Diagnosis not present

## 2024-05-24 DIAGNOSIS — R399 Unspecified symptoms and signs involving the genitourinary system: Secondary | ICD-10-CM | POA: Diagnosis not present

## 2024-05-29 DIAGNOSIS — I10 Essential (primary) hypertension: Secondary | ICD-10-CM | POA: Diagnosis not present

## 2024-05-29 DIAGNOSIS — E785 Hyperlipidemia, unspecified: Secondary | ICD-10-CM | POA: Diagnosis not present

## 2024-05-29 DIAGNOSIS — G729 Myopathy, unspecified: Secondary | ICD-10-CM | POA: Diagnosis not present

## 2024-06-07 DIAGNOSIS — I10 Essential (primary) hypertension: Secondary | ICD-10-CM | POA: Diagnosis not present

## 2024-06-07 DIAGNOSIS — M19011 Primary osteoarthritis, right shoulder: Secondary | ICD-10-CM | POA: Diagnosis not present

## 2024-06-07 DIAGNOSIS — J019 Acute sinusitis, unspecified: Secondary | ICD-10-CM | POA: Diagnosis not present

## 2024-06-07 DIAGNOSIS — M25512 Pain in left shoulder: Secondary | ICD-10-CM | POA: Diagnosis not present

## 2024-07-19 DIAGNOSIS — R399 Unspecified symptoms and signs involving the genitourinary system: Secondary | ICD-10-CM | POA: Diagnosis not present

## 2024-08-09 DIAGNOSIS — H18513 Endothelial corneal dystrophy, bilateral: Secondary | ICD-10-CM | POA: Diagnosis not present

## 2024-09-08 DIAGNOSIS — Z23 Encounter for immunization: Secondary | ICD-10-CM | POA: Diagnosis not present

## 2024-10-06 DIAGNOSIS — Z23 Encounter for immunization: Secondary | ICD-10-CM | POA: Diagnosis not present

## 2024-10-06 DIAGNOSIS — G43009 Migraine without aura, not intractable, without status migrainosus: Secondary | ICD-10-CM | POA: Diagnosis not present

## 2024-10-06 DIAGNOSIS — Z90710 Acquired absence of both cervix and uterus: Secondary | ICD-10-CM | POA: Diagnosis not present

## 2024-10-06 DIAGNOSIS — Z860100 Personal history of colon polyps, unspecified: Secondary | ICD-10-CM | POA: Diagnosis not present

## 2024-10-06 DIAGNOSIS — Z0000 Encounter for general adult medical examination without abnormal findings: Secondary | ICD-10-CM | POA: Diagnosis not present

## 2024-10-06 DIAGNOSIS — E785 Hyperlipidemia, unspecified: Secondary | ICD-10-CM | POA: Diagnosis not present

## 2024-10-06 DIAGNOSIS — N3941 Urge incontinence: Secondary | ICD-10-CM | POA: Diagnosis not present

## 2024-10-06 DIAGNOSIS — I10 Essential (primary) hypertension: Secondary | ICD-10-CM | POA: Diagnosis not present

## 2024-10-20 DIAGNOSIS — R399 Unspecified symptoms and signs involving the genitourinary system: Secondary | ICD-10-CM | POA: Diagnosis not present

## 2024-10-20 DIAGNOSIS — N390 Urinary tract infection, site not specified: Secondary | ICD-10-CM | POA: Diagnosis not present

## 2024-11-01 DIAGNOSIS — M19211 Secondary osteoarthritis, right shoulder: Secondary | ICD-10-CM | POA: Diagnosis not present
